# Patient Record
Sex: Male | Born: 1950 | Race: Black or African American | Hispanic: No | Marital: Married | Smoking: Current every day smoker
Health system: Southern US, Community
[De-identification: ages and names within clinical notes are randomized; demographics above are authoritative.]

## PROBLEM LIST (undated history)

## (undated) DIAGNOSIS — I10 Essential (primary) hypertension: Secondary | ICD-10-CM

---

## 1999-05-04 ENCOUNTER — Emergency Department (HOSPITAL_COMMUNITY): Admission: EM | Admit: 1999-05-04 | Discharge: 1999-05-04 | Payer: Self-pay | Admitting: Emergency Medicine

## 1999-12-21 ENCOUNTER — Emergency Department (HOSPITAL_COMMUNITY): Admission: EM | Admit: 1999-12-21 | Discharge: 1999-12-21 | Payer: Self-pay | Admitting: *Deleted

## 2001-02-06 ENCOUNTER — Emergency Department (HOSPITAL_COMMUNITY): Admission: EM | Admit: 2001-02-06 | Discharge: 2001-02-06 | Payer: Self-pay | Admitting: Emergency Medicine

## 2003-02-24 ENCOUNTER — Emergency Department (HOSPITAL_COMMUNITY): Admission: AD | Admit: 2003-02-24 | Discharge: 2003-02-24 | Payer: Self-pay | Admitting: Emergency Medicine

## 2009-08-15 ENCOUNTER — Emergency Department (HOSPITAL_COMMUNITY): Admission: EM | Admit: 2009-08-15 | Discharge: 2009-08-15 | Payer: Self-pay | Admitting: Emergency Medicine

## 2010-06-07 LAB — DIFFERENTIAL
Eosinophils Absolute: 0.3 10*3/uL (ref 0.0–0.7)
Eosinophils Relative: 4 % (ref 0–5)
Lymphocytes Relative: 17 % (ref 12–46)
Neutro Abs: 4.9 10*3/uL (ref 1.7–7.7)
Neutrophils Relative %: 69 % (ref 43–77)

## 2010-06-07 LAB — URINALYSIS, ROUTINE W REFLEX MICROSCOPIC
Glucose, UA: NEGATIVE mg/dL
Hgb urine dipstick: NEGATIVE
Ketones, ur: NEGATIVE mg/dL
Specific Gravity, Urine: 1.027 (ref 1.005–1.030)
Urobilinogen, UA: 0.2 mg/dL (ref 0.0–1.0)
pH: 6.5 (ref 5.0–8.0)

## 2010-06-07 LAB — POCT CARDIAC MARKERS: Troponin i, poc: <0.05 ng/mL (ref 0.00–0.09)

## 2010-06-07 LAB — CBC
HCT: 36.5 % — ABNORMAL LOW (ref 39.0–52.0)
MCHC: 35 g/dL (ref 30.0–36.0)
MCV: 92.9 fL (ref 78.0–100.0)
Platelets: 286 10*3/uL (ref 150–400)
RBC: 3.93 MIL/uL — ABNORMAL LOW (ref 4.22–5.81)
RDW: 13.9 % (ref 11.5–15.5)
WBC: 7.2 10*3/uL (ref 4.0–10.5)

## 2010-06-07 LAB — BASIC METABOLIC PANEL
BUN: 11 mg/dL (ref 6–23)
GFR calc Af Amer: >60 mL/min (ref >60)
GFR calc non Af Amer: >60 mL/min (ref >60)
Sodium: 132 mEq/L — ABNORMAL LOW (ref 135–145)

## 2010-06-07 LAB — URINE CULTURE: Colony Count: NO GROWTH

## 2010-06-07 LAB — POCT I-STAT, CHEM 8
Calcium, Ion: 1.15 mmol/L (ref 1.12–1.32)
Chloride: 105 mEq/L (ref 96–112)
HCT: 38 % — ABNORMAL LOW (ref 39.0–52.0)
Hemoglobin: 12.9 g/dL — ABNORMAL LOW (ref 13.0–17.0)
Potassium: 4 mEq/L (ref 3.5–5.1)

## 2010-07-22 ENCOUNTER — Emergency Department (HOSPITAL_COMMUNITY): Payer: Self-pay

## 2010-07-22 ENCOUNTER — Emergency Department (HOSPITAL_COMMUNITY)
Admission: EM | Admit: 2010-07-22 | Discharge: 2010-07-22 | Disposition: A | Discharge status: Home / Self Care | Payer: Self-pay | Attending: Emergency Medicine | Admitting: Emergency Medicine

## 2010-07-22 DIAGNOSIS — Z79899 Other long term (current) drug therapy: Secondary | ICD-10-CM | POA: Insufficient documentation

## 2010-07-22 DIAGNOSIS — M549 Dorsalgia, unspecified: Secondary | ICD-10-CM | POA: Insufficient documentation

## 2010-07-22 DIAGNOSIS — G8929 Other chronic pain: Secondary | ICD-10-CM | POA: Insufficient documentation

## 2010-07-22 DIAGNOSIS — R079 Chest pain, unspecified: Secondary | ICD-10-CM | POA: Insufficient documentation

## 2010-07-22 DIAGNOSIS — S20219A Contusion of unspecified front wall of thorax, initial encounter: Secondary | ICD-10-CM | POA: Insufficient documentation

## 2010-07-22 DIAGNOSIS — S298XXA Other specified injuries of thorax, initial encounter: Secondary | ICD-10-CM | POA: Insufficient documentation

## 2010-07-22 DIAGNOSIS — I10 Essential (primary) hypertension: Secondary | ICD-10-CM | POA: Insufficient documentation

## 2010-07-22 DIAGNOSIS — IMO0002 Reserved for concepts with insufficient information to code with codable children: Secondary | ICD-10-CM | POA: Insufficient documentation

## 2011-01-11 ENCOUNTER — Emergency Department (HOSPITAL_COMMUNITY)
Admission: EM | Admit: 2011-01-11 | Discharge: 2011-01-12 | Discharge status: Against Medical Advice | Payer: Self-pay | Attending: Emergency Medicine | Admitting: Emergency Medicine

## 2011-01-11 DIAGNOSIS — M7989 Other specified soft tissue disorders: Secondary | ICD-10-CM | POA: Insufficient documentation

## 2015-06-22 ENCOUNTER — Emergency Department (HOSPITAL_COMMUNITY)
Admission: EM | Admit: 2015-06-22 | Discharge: 2015-06-22 | Disposition: A | Discharge status: Home / Self Care | Payer: Non-veteran care | Attending: Emergency Medicine | Admitting: Emergency Medicine

## 2015-06-22 ENCOUNTER — Encounter (HOSPITAL_COMMUNITY): Payer: Self-pay | Admitting: Emergency Medicine

## 2015-06-22 ENCOUNTER — Emergency Department (HOSPITAL_COMMUNITY): Payer: Non-veteran care

## 2015-06-22 DIAGNOSIS — R200 Anesthesia of skin: Secondary | ICD-10-CM | POA: Insufficient documentation

## 2015-06-22 DIAGNOSIS — F17210 Nicotine dependence, cigarettes, uncomplicated: Secondary | ICD-10-CM | POA: Insufficient documentation

## 2015-06-22 DIAGNOSIS — I10 Essential (primary) hypertension: Secondary | ICD-10-CM | POA: Insufficient documentation

## 2015-06-22 DIAGNOSIS — M5412 Radiculopathy, cervical region: Secondary | ICD-10-CM | POA: Insufficient documentation

## 2015-06-22 HISTORY — DX: Essential (primary) hypertension: I10

## 2015-06-22 MED ORDER — METHYLPREDNISOLONE 4 MG PO TBPK: ORAL_TABLET | ORAL | Status: DC

## 2015-06-22 NOTE — ED Provider Notes (Signed)
CSN: [id number]     Arrival date & time 06/22/15  0436 History   First MD Initiated Contact with Patient 06/22/15 [phone]     Chief Complaint  Patient presents with  . Hand Problem     (Consider location/radiation/quality/duration/timing/severity/associated sxs/prior Treatment) HPI  This is a 65 year old male with a history of hypertension who presents with right arm pain and intermittent right hand numbness. Patient reports symptoms have been ongoing and waxing and waning for the last 2-3 months. He states that he has numbness over his second and third digits. Denies any numbness of the arm. Denies any weakness. States that sometimes the numbness is worse with range of motion of the right shoulder. He also reports occasional pain in the right shoulder. Symptoms worsened tonight and were more frequent. He denies any recent injury. He denies any neck pain. Denies any headache. Denies any other weakness, numbness, tingling, speech difficulty. He has not tried anything for his symptoms. He has not seen his doctor. Patient is currently asymptomatic.  Past Medical History  Diagnosis Date  . Hypertension    History reviewed. No pertinent past surgical history. No family history on file. Social History  Substance Use Topics  . Smoking status: Current Every Day Smoker -- 0.50 packs/day    Types: Cigarettes  . Smokeless tobacco: None  . Alcohol Use: None    Review of Systems  Constitutional: Negative for fever.  Musculoskeletal: Negative for neck pain and neck stiffness.       Right shoulder pain  Neurological: Positive for numbness. Negative for facial asymmetry, weakness and headaches.  All other systems reviewed and are negative.     Allergies  Review of patient's allergies indicates not on file.  Home Medications   Prior to Admission medications   Medication Sig Start Date End Date Taking? Authorizing Provider  methylPREDNISolone (MEDROL DOSEPAK) 4 MG TBPK tablet Take as directed  on packet 06/22/15   Merryl Hacker, MD   BP 146/71 mmHg  Pulse 60  Temp(Src) 97.8 F (36.6 C) (Oral)  Resp 18  Ht 5' 11.5" (1.816 m)  Wt 230 lb (104.327 kg)  BMI 31.63 kg/m2  SpO2 100% Physical Exam  Constitutional: He is oriented to person, place, and time. He appears well-developed and well-nourished. No distress.  HENT:  Head: Normocephalic and atraumatic.  Neck: Normal range of motion.  No midline C-spine tenderness  Cardiovascular: Normal rate, regular rhythm and normal heart sounds.   No murmur heard. Pulmonary/Chest: Effort normal and breath sounds normal. No respiratory distress. He has no wheezes.  Musculoskeletal: He exhibits no edema.  Normal range of motion of the right shoulder, no tenderness to palpation over the muscular structures, no obvious deformities, normal strength  Neurological: He is alert and oriented to person, place, and time.  5 out of 5 strength in all 4 extremities, sensation intact to light, cranial nerves II through XII intact  Skin: Skin is warm and dry.  Psychiatric: He has a normal mood and affect.  Nursing note and vitals reviewed.   ED Course  Procedures (including critical care time) Labs Review Labs Reviewed - No data to display  Imaging Review Dg Shoulder Right  06/22/2015  CLINICAL DATA:  Chronic right shoulder pain for 3 months. Initial encounter. EXAM: RIGHT SHOULDER - 2+ VIEW COMPARISON:  None. FINDINGS: There is no evidence of fracture or dislocation. The right humeral head is seated within the glenoid fossa. Degenerative change is noted at the right acromioclavicular joint. No significant  soft tissue abnormalities are seen. The visualized portions of the lungs are clear. IMPRESSION: 1. No evidence of fracture or dislocation. 2. Degenerative change at the right acromioclavicular joint. Electronically Signed   By: Garald Balding M.D.   On: 06/22/2015 05:26   I have personally reviewed and evaluated these images and lab results as part  of my medical decision-making.   EKG Interpretation None      MDM   Final diagnoses:  C7 radiculopathy    Patient presents with pain in the right shoulder and waxing and waning numbness of the second digit of the right hand. He is nontoxic on exam. Symptoms have been waxing and waning for the last 2-3 months. Numbness seems to be exacerbated with range of motion of the shoulder. It is in the distribution of C7. He's currently intact. Otherwise he has no other neurologic deficit. Doubt stroke. Plain films of the right shoulder are negative. He has no neck pain. Discussed with the patient my thoughts that this was likely a radicular symptoms secondary to nerve impingement at the shoulder. Will trial patient on a Medrol dose pack. Follow-up with primary physician if symptoms persist for possible MRI of the right shoulder or neck. No indication for emergent imaging at this time.  After history, exam, and medical workup I feel the patient has been appropriately medically screened and is safe for discharge home. Pertinent diagnoses were discussed with the patient. Patient was given return precautions.     Merryl Hacker, MD 06/22/15 [phone]

## 2015-06-22 NOTE — ED Notes (Signed)
MD at bedside. 

## 2015-06-22 NOTE — ED Notes (Signed)
Pt reports that he woke up this AM and found his hand numb.  He has had the feeling return but then "it gets numb again."  Apparently this has been going on for the past month.

## 2015-06-22 NOTE — ED Notes (Signed)
Pt reports that he is worried b/c he "might have swallowed some mouthwash" this morning.

## 2015-06-22 NOTE — Discharge Instructions (Signed)

## 2015-06-22 NOTE — ED Notes (Signed)
Patient transported to X-ray 

## 2015-09-25 ENCOUNTER — Emergency Department (HOSPITAL_COMMUNITY)
Admission: EM | Admit: 2015-09-25 | Discharge: 2015-09-25 | Disposition: A | Discharge status: Home / Self Care | Payer: Non-veteran care | Attending: Emergency Medicine | Admitting: Emergency Medicine

## 2015-09-25 ENCOUNTER — Emergency Department (HOSPITAL_COMMUNITY): Payer: Non-veteran care

## 2015-09-25 ENCOUNTER — Encounter (HOSPITAL_COMMUNITY): Payer: Self-pay | Admitting: Emergency Medicine

## 2015-09-25 DIAGNOSIS — M10021 Idiopathic gout, right elbow: Secondary | ICD-10-CM | POA: Insufficient documentation

## 2015-09-25 DIAGNOSIS — I10 Essential (primary) hypertension: Secondary | ICD-10-CM | POA: Insufficient documentation

## 2015-09-25 DIAGNOSIS — M109 Gout, unspecified: Secondary | ICD-10-CM

## 2015-09-25 DIAGNOSIS — F17210 Nicotine dependence, cigarettes, uncomplicated: Secondary | ICD-10-CM | POA: Insufficient documentation

## 2015-09-25 LAB — CBC WITH DIFFERENTIAL/PLATELET
Basophils Absolute: 0.1 10*3/uL (ref 0.0–0.1)
Basophils Relative: 1 %
Eosinophils Absolute: 0.3 10*3/uL (ref 0.0–0.7)
Eosinophils Relative: 3 %
HEMATOCRIT: 36.6 % — AB (ref 39.0–52.0)
HEMOGLOBIN: 12.5 g/dL — AB (ref 13.0–17.0)
LYMPHS ABS: 1.2 10*3/uL (ref 0.7–4.0)
LYMPHS PCT: 15 %
MCH: 30.9 pg (ref 26.0–34.0)
MCHC: 34.2 g/dL (ref 30.0–36.0)
MCV: 90.4 fL (ref 78.0–100.0)
MONOS PCT: 15 %
Monocytes Absolute: 1.2 10*3/uL — ABNORMAL HIGH (ref 0.1–1.0)
NEUTROS ABS: 5.5 10*3/uL (ref 1.7–7.7)
NEUTROS PCT: 66 %
PLATELETS: 260 10*3/uL (ref 150–400)
RBC: 4.05 MIL/uL — AB (ref 4.22–5.81)
RDW: 13.6 % (ref 11.5–15.5)
WBC: 8.2 10*3/uL (ref 4.0–10.5)

## 2015-09-25 LAB — BASIC METABOLIC PANEL
ANION GAP: 8 (ref 5–15)
BUN: 7 mg/dL (ref 6–20)
CO2: 26 mmol/L (ref 22–32)
Calcium: 9.2 mg/dL (ref 8.9–10.3)
Chloride: 103 mmol/L (ref 101–111)
Creatinine, Ser: 1.01 mg/dL (ref 0.61–1.24)
GFR calc Af Amer: >60 mL/min (ref >60)
GLUCOSE: 120 mg/dL — AB (ref 65–99)
POTASSIUM: 3.5 mmol/L (ref 3.5–5.1)
Sodium: 137 mmol/L (ref 135–145)

## 2015-09-25 LAB — SEDIMENTATION RATE: Sed Rate: 64 mm/hr — ABNORMAL HIGH (ref 0–16)

## 2015-09-25 LAB — URIC ACID: URIC ACID, SERUM: 8 mg/dL — AB (ref 4.4–7.6)

## 2015-09-25 LAB — C-REACTIVE PROTEIN: CRP: 8.1 mg/dL — ABNORMAL HIGH (ref <1.0)

## 2015-09-25 MED ORDER — ONDANSETRON HCL 4 MG/2ML IJ SOLN
4.0000 mg | Freq: Once | INTRAMUSCULAR | Status: AC
  Administered 2015-09-25: 4 mg via INTRAVENOUS
  Filled 2015-09-25: qty 2

## 2015-09-25 MED ORDER — TETANUS-DIPHTH-ACELL PERTUSSIS 5-2.5-18.5 LF-MCG/0.5 IM SUSP
0.5000 mL | Freq: Once | INTRAMUSCULAR | Status: AC
  Administered 2015-09-25: 0.5 mL via INTRAMUSCULAR
  Filled 2015-09-25: qty 0.5

## 2015-09-25 MED ORDER — COLCHICINE 0.6 MG PO TABS: ORAL_TABLET | ORAL | Status: DC

## 2015-09-25 MED ORDER — CLINDAMYCIN HCL 150 MG PO CAPS
450.0000 mg | ORAL_CAPSULE | Freq: Once | ORAL | Status: AC
  Administered 2015-09-25: 450 mg via ORAL
  Filled 2015-09-25: qty 3

## 2015-09-25 MED ORDER — OXYCODONE-ACETAMINOPHEN 5-325 MG PO TABS: 1.0000 | ORAL_TABLET | Freq: Four times a day (QID) | ORAL | Status: DC | PRN

## 2015-09-25 MED ORDER — MORPHINE SULFATE (PF) 4 MG/ML IV SOLN
4.0000 mg | Freq: Once | INTRAVENOUS | Status: AC
  Administered 2015-09-25: 4 mg via INTRAVENOUS
  Filled 2015-09-25: qty 1

## 2015-09-25 MED ORDER — SODIUM CHLORIDE 0.9 % IV BOLUS (SEPSIS)
1000.0000 mL | Freq: Once | INTRAVENOUS | Status: AC
  Administered 2015-09-25: 1000 mL via INTRAVENOUS

## 2015-09-25 NOTE — ED Provider Notes (Signed)
CSN: [id number]     Arrival date & time 09/25/15  0605 History   First MD Initiated Contact with Patient 09/25/15 [phone]     Chief Complaint  Patient presents with  . Elbow Pain  . Insect Bite     (Consider location/radiation/quality/duration/timing/severity/associated sxs/prior Treatment) HPI   Pulse 70, temperature 97.9 F (36.6 C), temperature source Oral, resp. rate 16, height 5\' 10"  (1.778 m), weight 102.059 kg, SpO2 98 %.  Eddie Mendoza is a 65 y.o. male complaining of pain and swelling worsening over the course of 2 days to right elbow. Pain is severe, 10 out of 10 and exacerbated by movement and palpation. Does not remember trauma to the area however he's been working on an old house in him and his wife both received several bug bites but he has not received any bug bite to the elbow that he remembers. Patient denies fever, chills, nausea, vomiting. Patient is right-hand-dominant.  Past Medical History  Diagnosis Date  . Hypertension    History reviewed. No pertinent past surgical history. No family history on file. Social History  Substance Use Topics  . Smoking status: Current Every Day Smoker -- 0.50 packs/day for 30 years    Types: Cigarettes  . Smokeless tobacco: Never Used  . Alcohol Use: 1.8 oz/week    3 Shots of liquor per week    Review of Systems  10 systems reviewed and found to be negative, except as noted in the HPI.   Allergies  Review of patient's allergies indicates no known allergies.  Home Medications   Prior to Admission medications   Medication Sig Start Date End Date Taking? Authorizing Provider  methylPREDNISolone (MEDROL DOSEPAK) 4 MG TBPK tablet Take as directed on packet 06/22/15   Merryl Hacker, MD   Pulse 70  Temp(Src) 97.9 F (36.6 C) (Oral)  Resp 16  Ht 5\' 10"  (1.778 m)  Wt 102.059 kg  BMI 32.28 kg/m2  SpO2 98% Physical Exam  Constitutional: He is oriented to person, place, and time. He appears well-developed and  well-nourished. No distress.  HENT:  Head: Normocephalic.  Eyes: Conjunctivae and EOM are normal.  Cardiovascular: Normal rate.   Pulmonary/Chest: Effort normal and breath sounds normal. No stridor.  Musculoskeletal: Normal range of motion. He exhibits edema.  No overlying puncture wounds to right elbow, moderate warmth, mild induration to skin without fluctuance or palpable bursa, severe pain with passive extension and flexion of right elbow, distally neurovascularly intact with 2+ radial pulse and patient able to differentiate between pinprick and light touch on the fingers.  Neurological: He is alert and oriented to person, place, and time.  Psychiatric: He has a normal mood and affect.  Nursing note and vitals reviewed.   ED Course  Procedures (including critical care time) Labs Review Labs Reviewed - No data to display  Imaging Review No results found. I have personally reviewed and evaluated these images and lab results as part of my medical decision-making.   EKG Interpretation None      MDM   Final diagnoses:  Acute gout of right elbow, unspecified cause   Filed Vitals:   09/25/15 0619 09/25/15 0630 09/25/15 0700  BP:  148/80 154/68  Pulse: 70 64 62  Temp: 97.9 F (36.6 C)    TempSrc: Oral    Resp: 16    Height: 5\' 10"  (1.778 m)    Weight: 102.059 kg    SpO2: 98% 100% 98%    Medications  Tdap (BOOSTRIX)  injection 0.5 mL (not administered)  clindamycin (CLEOCIN) capsule 450 mg (not administered)  morphine 4 MG/ML injection 4 mg (4 mg Intravenous Given 09/25/15 0820)  ondansetron (ZOFRAN) injection 4 mg (4 mg Intravenous Given 09/25/15 0816)    Eddie Mendoza is 65 y.o. male presenting with Significant pain and swelling to right elbow onset several days ago, patient states that he has been trying to renovate an older house, him and his wife have been bitten by unknown insects. Tetanus shot is updated, patient has severe pain with movement of the elbow, there is  very mild overlying cellulitis of the elbow, no systemic signs of infection. Given the severity of his pain with movement will check basic blood work, get x-ray and IV pain medication.  Patients CRP is aches, normal under 1, sedimentation rate is 60, normal under 20. Patient does have improved range of motion with IV morphine however, concerned about septic joint versus a overlying cellulitis. Attending physician has evaluated this patient and also agrees that exam is concerning for possible septic joint, given the differential of cellulitis we are hesitant to tap the elbow, will discuss with hand surgeon Dr. Fredna Dow.  Fredna Dow will come to evaluate the patient, request that we get a uric acid. Patient states he has a family history of no personal history.  Uric acid elevated, discussed with Dr. Fredna Dow who recommends colchicine, will also add on Percocet. Patient given referral for local care.  Evaluation does not show pathology that would require ongoing emergent intervention or inpatient treatment. Pt is hemodynamically stable and mentating appropriately. Discussed findings and plan with patient/guardian, who agrees with care plan. All questions answered. Return precautions discussed and outpatient follow up given.   Discharge Medication List as of 09/25/2015 12:10 PM    START taking these medications   Details  colchicine 0.6 MG tablet Take 0.6mg  (one tablet) by mouth every 1-2 hours until one of the following occurs: 1.  The pain is gone 2.  The maximum dose has been given ( no more than 3 tabs in 3 hours or 10 tabs in 24 hours) 3.  The side effects outweight the benefits, Print    oxyCODONE-acetaminophen (PERCOCET) 5-325 MG tablet Take 1 tablet by mouth every 6 (six) hours as needed., Starting 09/25/2015, Until Discontinued, [employer], PA-C 09/25/15 1330  Veryl Speak, MD 09/25/15 1539

## 2015-09-25 NOTE — Discharge Instructions (Signed)
Do not hesitate to return to the emergency room for any new, worsening or concerning symptoms.  Please obtain primary care using resource guide below. Let them know that you were seen in the emergency room and that they will need to obtain records for further outpatient management.  Take percocet for breakthrough pain, do not drink alcohol, drive, care for children or do other critical tasks while taking percocet.  . Gout Gout is an inflammatory arthritis caused by a buildup of uric acid crystals in the joints. Uric acid is a chemical that is normally present in the blood. When the level of uric acid in the blood is too high it can form crystals that deposit in your joints and tissues. This causes joint redness, soreness, and swelling (inflammation). Repeat attacks are common. Over time, uric acid crystals can form into masses (tophi) near a joint, destroying bone and causing disfigurement. Gout is treatable and often preventable. CAUSES  The disease begins with elevated levels of uric acid in the blood. Uric acid is produced by your body when it breaks down a naturally found substance called purines. Certain foods you eat, such as meats and fish, contain high amounts of purines. Causes of an elevated uric acid level include:  Being passed down from parent to child (heredity).  Diseases that cause increased uric acid production (such as obesity, psoriasis, and certain cancers).  Excessive alcohol use.  Diet, especially diets rich in meat and seafood.  Medicines, including certain cancer-fighting medicines (chemotherapy), water pills (diuretics), and aspirin.  Chronic kidney disease. The kidneys are no longer able to remove uric acid well.  Problems with metabolism. Conditions strongly associated with gout include:  Obesity.  High blood pressure.  High cholesterol.  Diabetes. Not everyone with elevated uric acid levels gets gout. It is not understood why some people get gout and  others do not. Surgery, joint injury, and eating too much of certain foods are some of the factors that can lead to gout attacks. SYMPTOMS   An attack of gout comes on quickly. It causes intense pain with redness, swelling, and warmth in a joint.  Fever can occur.  Often, only one joint is involved. Certain joints are more commonly involved:  Base of the big toe.  Knee.  Ankle.  Wrist.  Finger. Without treatment, an attack usually goes away in a few days to weeks. Between attacks, you usually will not have symptoms, which is different from many other forms of arthritis. DIAGNOSIS  Your caregiver will suspect gout based on your symptoms and exam. In some cases, tests may be recommended. The tests may include:  Blood tests.  Urine tests.  X-rays.  Joint fluid exam. This exam requires a needle to remove fluid from the joint (arthrocentesis). Using a microscope, gout is confirmed when uric acid crystals are seen in the joint fluid. TREATMENT  There are two phases to gout treatment: treating the sudden onset (acute) attack and preventing attacks (prophylaxis).  Treatment of an Acute Attack.  Medicines are used. These include anti-inflammatory medicines or steroid medicines.  An injection of steroid medicine into the affected joint is sometimes necessary.  The painful joint is rested. Movement can worsen the arthritis.  You may use warm or cold treatments on painful joints, depending which works best for you.  Treatment to Prevent Attacks.  If you suffer from frequent gout attacks, your caregiver may advise preventive medicine. These medicines are started after the acute attack subsides. These medicines either help your kidneys  eliminate uric acid from your body or decrease your uric acid production. You may need to stay on these medicines for a very long time.  The early phase of treatment with preventive medicine can be associated with an increase in acute gout attacks. For  this reason, during the first few months of treatment, your caregiver may also advise you to take medicines usually used for acute gout treatment. Be sure you understand your caregiver's directions. Your caregiver may make several adjustments to your medicine dose before these medicines are effective.  Discuss dietary treatment with your caregiver or dietitian. Alcohol and drinks high in sugar and fructose and foods such as meat, poultry, and seafood can increase uric acid levels. Your caregiver or dietitian can advise you on drinks and foods that should be limited. HOME CARE INSTRUCTIONS   Do not take aspirin to relieve pain. This raises uric acid levels.  Only take over-the-counter or prescription medicines for pain, discomfort, or fever as directed by your caregiver.  Rest the joint as much as possible. When in bed, keep sheets and blankets off painful areas.  Keep the affected joint raised (elevated).  Apply warm or cold treatments to painful joints. Use of warm or cold treatments depends on which works best for you.  Use crutches if the painful joint is in your leg.  Drink enough fluids to keep your urine clear or pale yellow. This helps your body get rid of uric acid. Limit alcohol, sugary drinks, and fructose drinks.  Follow your dietary instructions. Pay careful attention to the amount of protein you eat. Your daily diet should emphasize fruits, vegetables, whole grains, and fat-free or low-fat milk products. Discuss the use of coffee, vitamin C, and cherries with your caregiver or dietitian. These may be helpful in lowering uric acid levels.  Maintain a healthy body weight. SEEK MEDICAL CARE IF:   You develop diarrhea, vomiting, or any side effects from medicines.  You do not feel better in 24 hours, or you are getting worse. SEEK IMMEDIATE MEDICAL CARE IF:   Your joint becomes suddenly more tender, and you have chills or a fever. MAKE SURE YOU:   Understand these  instructions.  Will watch your condition.  Will get help right away if you are not doing well or get worse.   This information is not intended to replace advice given to you by your health care provider. Make sure you discuss any questions you have with your health care provider.   Document Released: 03/04/2000 Document Revised: 03/28/2014 Document Reviewed: 10/19/2011 Elsevier Interactive Patient Education 2016 [place] are compounds that affect the level of uric acid in your body. A low-purine diet is a diet that is low in purines. Eating a low-purine diet can prevent the level of uric acid in your body from getting too high and causing gout or kidney stones or both. WHAT DO I NEED TO KNOW ABOUT THIS DIET?  Choose low-purine foods. Examples of low-purine foods are listed in the next section.  Drink plenty of fluids, especially water. Fluids can help remove uric acid from your body. Try to drink 8-16 cups (1.9-3.8 L) a day.  Limit foods high in fat, especially saturated fat, as fat makes it harder for the body to get rid of uric acid. Foods high in saturated fat include pizza, cheese, ice cream, whole milk, fried foods, and gravies. Choose foods that are lower in fat and lean sources of protein. Use olive oil when  cooking as it contains healthy fats that are not high in saturated fat.  Limit alcohol. Alcohol interferes with the elimination of uric acid from your body. If you are having a gout attack, avoid all alcohol.  Keep in mind that different people's bodies react differently to different foods. You will probably learn over time which foods do or do not affect you. If you discover that a food tends to cause your gout to flare up, avoid eating that food. You can more freely enjoy foods that do not cause problems. If you have any questions about a food item, talk to your dietitian or health care provider. WHICH FOODS ARE LOW, MODERATE, AND HIGH IN  PURINES? The following is a list of foods that are low, moderate, and high in purines. You can eat any amount of the foods that are low in purines. You may be able to have small amounts of foods that are moderate in purines. Ask your health care provider how much of a food moderate in purines you can have. Avoid foods high in purines. Grains  Foods low in purines: Enriched white bread, pasta, rice, cake, cornbread, popcorn.  Foods moderate in purines: Whole-grain breads and cereals, wheat germ, bran, oatmeal. Uncooked oatmeal. Dry wheat bran or wheat germ.  Foods high in purines: Pancakes, [place] toast, biscuits, muffins. Vegetables  Foods low in purines: All vegetables, except those that are moderate in purines.  Foods moderate in purines: Asparagus, cauliflower, spinach, mushrooms, green peas. Fruits  All fruits are low in purines. Meats and other Protein Foods  Foods low in purines: Eggs, nuts, peanut butter.  Foods moderate in purines: 80-90% lean beef, lamb, veal, pork, poultry, fish, eggs, peanut butter, nuts. Crab, lobster, oysters, and shrimp. Cooked dried beans, peas, and lentils.  Foods high in purines: Anchovies, sardines, herring, mussels, tuna, codfish, scallops, trout, and haddock. Berniece Salines. Organ meats (such as liver or kidney). [place]. Game meat. Goose. Sweetbreads. Dairy  All dairy foods are low in purines. Low-fat and fat-free dairy products are best because they are low in saturated fat. Beverages  Drinks low in purines: Water, carbonated beverages, tea, coffee, cocoa.  Drinks moderate in purines: Soft drinks and other drinks sweetened with high-fructose corn syrup. Juices. To find whether a food or drink is sweetened with high-fructose corn syrup, look at the ingredients list.  Drinks high in purines: Alcoholic beverages (such as beer). Condiments  Foods low in purines: Salt, herbs, olives, pickles, relishes, vinegar.  Foods moderate in purines: Butter,  margarine, oils, mayonnaise. Fats and Oils  Foods low in purines: All types, except gravies and sauces made with meat.  Foods high in purines: Gravies and sauces made with meat. Other Foods  Foods low in purines: Sugars, sweets, gelatin. Cake. Soups made without meat.  Foods moderate in purines: Meat-based or fish-based soups, broths, or bouillons. Foods and drinks sweetened with high-fructose corn syrup.  Foods high in purines: High-fat desserts (such as ice cream, cookies, cakes, pies, doughnuts, and chocolate). Contact your dietitian for more information on foods that are not listed here.   This information is not intended to replace advice given to you by your health care provider. Make sure you discuss any questions you have with your health care provider.   Document Released: 07/02/2010 Document Revised: 03/12/2013 Document Reviewed: 02/11/2013 Elsevier Interactive Patient Education 2016 [place] [employer] is a great source of information about community services available.  Access by  dialing 2-1-1 from anywhere in [place], or by website -  CustodianSupply.fi.   Other Local Resources (Updated 03/2015)  [place]    Phone Number and Address  [facility] medical care - 1st and 3rd Saturday of every month  Must not qualify for public or private insurance and must have limited income [phone] [address], [place]  Child care  Emergency assistance for housing and [employer]  Medicaid [phone] [address], [place] [address]   [facility] Department  Low-cost medical care for children, communicable diseases, sexually-transmitted diseases, immunizations, maternity care, womens health and family planning [phone] [address], [place] [address]  [facility]   Medication assistance for [facility] residents  Must meet income requirements [phone] [place], [place].    [place]  Child care  Emergency assistance for housing and [employer]  Medicaid [phone] [address] [place], [place] [address]  [facility] [place]   Low-cost medical care,   Monday through Friday, 9 am to 6 pm.   Accepts Medicare/Medicaid, and self-pay [phone] [address] [place], [place] [address]  [facility] for [place] care - Monday through Friday, 8:30 am - 5:30 pm  Accepts Medicaid and self-pay [phone] [address], [place], [place] [address]   [facility]  Primary medical care, including for those with sickle cell disease  Accepts Medicare, Medicaid, insurance and self-pay [phone] [place], [place]  [facility]   Primary medical care  Accepts Medicare, [place], insurance and self-pay [phone] [place], [place] [address]   [facility] Department of Social Services  Child care  Emergency assistance for housing and [employer]  Medicaid [phone] [address] [place], [place] [address]  [place] Department of Health and [employer]  Child care  Emergency assistance for housing and [employer]  Medicaid [phone] [place], [place] [address]   [facility] Medication Assistance [facility]  Medication assistance for [facility] residents with no insurance only  Must have a primary care doctor [phone] [address], [place], [place]  [facility]   Primary medical care  [place], [place], insurance  [phone] [address] [place], [place]  [facility]   Medication assistance [phone]  [facility]   Primary medical care  Accepts Medicare, [place], insurance and self-pay [phone] [address], [place] [address]   Internal Medicine   Primary medical care  Accepts Medicare, [place], insurance and self-pay [phone] [address], [place] [address]  [facility]  For [place] residents between the ages of 91 and 47 who do not have any form of health insurance, Medicare, [place], or [place] benefits.  Services are provided free of charge to uninsured patients who fall within federal poverty guidelines.    Hours: Tuesdays and Thursdays, 4:15 - 8 pm [phone] [address] [place], [place] [address]  [facility]     Primary medical care  Dental care  Nutritional counseling  Pharmacy  Accepts Medicaid, Medicare, most insurance.  Fees are adjusted based on ability to pay.  [place], [place] [address], [place], [place] [facility], [place], [facility] [place], [place]  [facility]  Womens health and family planning [phone] [place]. [place], [place] care  Emergency assistance for housing and [employer]  Medicaid [phone] [address], [place], [place] [address]   [facility]    Ages 82 and older  Hours: Mondays and Thursdays, 7:00 am - 9:00 am Patients are seen on a first come, first served basis. [phone], ext. [place], [place]  Child care  Emergency assistance for housing and [employer]  Medicaid [phone] [address], [place] [address]  [facility] [place]  Medication assistance  Rental assistance  Food pantry  Medication assistance  Housing assistance  Emergency food distribution  Utility assistance [place], [place]  [place]. [place], [place] [address] Hours: Tuesdays and Thursdays from 9am - 12 noon by appointment only  [address], [place] [address]  [facility] [place] private insurance, [place], and [place].  Payment is based on a sliding scale for those without insurance.  Hours: Mondays, Tuesdays and Thursdays, 8:30 am - 5:30 pm.   [phone] [place], [place]  [facility] - Family Medicine at [facility], [place], and [place].  Payment is based on a sliding scale for those without insurance. [phone] [address] [place], [place]  [facility] - Pediatrics at [address] Scientist, research (medical), [employer], and [place].  Payment is based on a sliding scale for those without insurance [phone] [address] [place], [employer], [place]  [facility] - Pediatrics at [employer], [place], and [place].  Payment is based on a sliding scale for those without insurance. [phone] [place], [place]  [facility] - Pediatrics at [facility], [place], and [place].  Payment is based on a sliding scale for those without insurance. [phone], ext. [phone] [place], [place].    [place] care.  Accepts Medicaid and self-pay.  [place], [place]

## 2015-09-25 NOTE — Progress Notes (Signed)
Orthopedic Tech Progress Note Patient Details:  Eddie Mendoza January 02, 1951 [id number]  Ortho Devices Type of Ortho Device: Arm sling   [name] 09/25/2015, 12:35 PM

## 2015-09-25 NOTE — ED Notes (Signed)
Ice pack applied to rt. elbow

## 2015-09-25 NOTE — ED Notes (Signed)
Pt coming from home with R arm in homemade sling. States he doesn't remember hitting on anything, but he and his wife have been "working a lot, cleaning up an old house." Obvious swelling to R elbow with pain to movement & palpation. +PMS in hand & fingers. Also notes several bite marks to bilat LEs that he reports "itch like fire". Unknown exposure to any particular insects but states insects "of all kinds" in old house they've been working in. Denies fever, chills, or flu-like symptoms.

## 2020-06-20 ENCOUNTER — Inpatient Hospital Stay (HOSPITAL_COMMUNITY)
Admission: EM | Admit: 2020-06-20 | Discharge: 2020-06-22 | DRG: 696 | Disposition: A | Discharge status: Home / Self Care | Payer: No Typology Code available for payment source | Attending: Internal Medicine | Admitting: Internal Medicine

## 2020-06-20 ENCOUNTER — Emergency Department (HOSPITAL_COMMUNITY): Payer: No Typology Code available for payment source

## 2020-06-20 ENCOUNTER — Other Ambulatory Visit: Payer: Self-pay

## 2020-06-20 ENCOUNTER — Encounter (HOSPITAL_COMMUNITY): Payer: Self-pay

## 2020-06-20 DIAGNOSIS — R710 Precipitous drop in hematocrit: Secondary | ICD-10-CM | POA: Diagnosis present

## 2020-06-20 DIAGNOSIS — Z28310 Unvaccinated for covid-19: Secondary | ICD-10-CM | POA: Diagnosis not present

## 2020-06-20 DIAGNOSIS — N401 Enlarged prostate with lower urinary tract symptoms: Secondary | ICD-10-CM | POA: Diagnosis present

## 2020-06-20 DIAGNOSIS — R1030 Lower abdominal pain, unspecified: Secondary | ICD-10-CM | POA: Diagnosis present

## 2020-06-20 DIAGNOSIS — Z8249 Family history of ischemic heart disease and other diseases of the circulatory system: Secondary | ICD-10-CM | POA: Diagnosis not present

## 2020-06-20 DIAGNOSIS — R310 Gross hematuria: Principal | ICD-10-CM | POA: Diagnosis present

## 2020-06-20 DIAGNOSIS — Z23 Encounter for immunization: Secondary | ICD-10-CM

## 2020-06-20 DIAGNOSIS — R030 Elevated blood-pressure reading, without diagnosis of hypertension: Secondary | ICD-10-CM

## 2020-06-20 DIAGNOSIS — Z720 Tobacco use: Secondary | ICD-10-CM | POA: Diagnosis not present

## 2020-06-20 DIAGNOSIS — Z20822 Contact with and (suspected) exposure to covid-19: Secondary | ICD-10-CM | POA: Diagnosis present

## 2020-06-20 DIAGNOSIS — F17210 Nicotine dependence, cigarettes, uncomplicated: Secondary | ICD-10-CM | POA: Diagnosis present

## 2020-06-20 DIAGNOSIS — I10 Essential (primary) hypertension: Secondary | ICD-10-CM | POA: Diagnosis present

## 2020-06-20 DIAGNOSIS — R319 Hematuria, unspecified: Secondary | ICD-10-CM | POA: Diagnosis present

## 2020-06-20 LAB — URINALYSIS, ROUTINE W REFLEX MICROSCOPIC
Bacteria, UA: NONE SEEN
Bilirubin Urine: NEGATIVE
Glucose, UA: NEGATIVE mg/dL
Ketones, ur: NEGATIVE mg/dL
Nitrite: NEGATIVE
Protein, ur: NEGATIVE mg/dL
RBC / HPF: >50 RBC/hpf — ABNORMAL HIGH (ref 0–5)
Specific Gravity, Urine: 1.006 (ref 1.005–1.030)
WBC, UA: >50 WBC/hpf — ABNORMAL HIGH (ref 0–5)
pH: 9 — ABNORMAL HIGH (ref 5.0–8.0)

## 2020-06-20 LAB — CBC WITH DIFFERENTIAL/PLATELET
Abs Immature Granulocytes: 0.08 10*3/uL — ABNORMAL HIGH (ref 0.00–0.07)
Basophils Absolute: 0.1 10*3/uL (ref 0.0–0.1)
Basophils Relative: 0 %
Eosinophils Absolute: 0 10*3/uL (ref 0.0–0.5)
Eosinophils Relative: 0 %
HCT: 34.3 % — ABNORMAL LOW (ref 39.0–52.0)
Hemoglobin: 11.7 g/dL — ABNORMAL LOW (ref 13.0–17.0)
Immature Granulocytes: 1 %
Lymphocytes Relative: 11 %
Lymphs Abs: 1.4 10*3/uL (ref 0.7–4.0)
MCH: 32.5 pg (ref 26.0–34.0)
MCHC: 34.1 g/dL (ref 30.0–36.0)
MCV: 95.3 fL (ref 80.0–100.0)
Monocytes Absolute: 1 10*3/uL (ref 0.1–1.0)
Monocytes Relative: 8 %
Neutro Abs: 10.2 10*3/uL — ABNORMAL HIGH (ref 1.7–7.7)
Neutrophils Relative %: 80 %
Platelets: 340 10*3/uL (ref 150–400)
RBC: 3.6 MIL/uL — ABNORMAL LOW (ref 4.22–5.81)
RDW: 13.7 % (ref 11.5–15.5)
WBC: 12.6 10*3/uL — ABNORMAL HIGH (ref 4.0–10.5)
nRBC: 0 % (ref 0.0–0.2)

## 2020-06-20 LAB — BASIC METABOLIC PANEL
Anion gap: 13 (ref 5–15)
BUN: 15 mg/dL (ref 8–23)
CO2: 20 mmol/L — ABNORMAL LOW (ref 22–32)
Calcium: 8.8 mg/dL — ABNORMAL LOW (ref 8.9–10.3)
Chloride: 106 mmol/L (ref 98–111)
Creatinine, Ser: 1.02 mg/dL (ref 0.61–1.24)
GFR, Estimated: >60 mL/min (ref >60)
Glucose, Bld: 167 mg/dL — ABNORMAL HIGH (ref 70–99)
Potassium: 3.6 mmol/L (ref 3.5–5.1)
Sodium: 139 mmol/L (ref 135–145)

## 2020-06-20 LAB — PROTIME-INR
INR: 1 (ref 0.8–1.2)
Prothrombin Time: 12.8 seconds (ref 11.4–15.2)

## 2020-06-20 LAB — HEMOGLOBIN AND HEMATOCRIT, BLOOD
HCT: 32.7 % — ABNORMAL LOW (ref 39.0–52.0)
Hemoglobin: 11.1 g/dL — ABNORMAL LOW (ref 13.0–17.0)

## 2020-06-20 MED ORDER — SODIUM CHLORIDE 0.9 % IV SOLN
1.0000 g | Freq: Once | INTRAVENOUS | Status: AC
  Administered 2020-06-20: 1 g via INTRAVENOUS
  Filled 2020-06-20: qty 10

## 2020-06-20 MED ORDER — SODIUM CHLORIDE 0.9 % IR SOLN
3000.0000 mL | Status: DC
  Administered 2020-06-20 – 2020-06-21 (×3): 3000 mL

## 2020-06-20 MED ORDER — ONDANSETRON HCL 4 MG/2ML IJ SOLN: 4.0000 mg | Freq: Four times a day (QID) | INTRAMUSCULAR | Status: DC | PRN

## 2020-06-20 MED ORDER — LIDOCAINE HCL URETHRAL/MUCOSAL 2 % EX GEL
1.0000 "application " | Freq: Once | CUTANEOUS | Status: AC
  Administered 2020-06-20: 1 via URETHRAL
  Filled 2020-06-20: qty 10

## 2020-06-20 MED ORDER — NICOTINE 7 MG/24HR TD PT24
7.0000 mg | MEDICATED_PATCH | Freq: Every day | TRANSDERMAL | Status: DC
  Filled 2020-06-20: qty 1

## 2020-06-20 MED ORDER — HYDROMORPHONE HCL 1 MG/ML IJ SOLN
1.0000 mg | Freq: Once | INTRAMUSCULAR | Status: AC
  Administered 2020-06-20: 1 mg via INTRAVENOUS
  Filled 2020-06-20: qty 1

## 2020-06-20 MED ORDER — LABETALOL HCL 5 MG/ML IV SOLN: 10.0000 mg | INTRAVENOUS | Status: DC | PRN

## 2020-06-20 MED ORDER — POLYETHYLENE GLYCOL 3350 17 G PO PACK: 17.0000 g | PACK | Freq: Every day | ORAL | Status: DC | PRN

## 2020-06-20 MED ORDER — ONDANSETRON HCL 4 MG PO TABS
4.0000 mg | ORAL_TABLET | Freq: Four times a day (QID) | ORAL | Status: DC | PRN
Start: 2020-06-20 — End: 2020-06-22

## 2020-06-20 MED ORDER — ACETAMINOPHEN 650 MG RE SUPP: 650.0000 mg | Freq: Four times a day (QID) | RECTAL | Status: DC | PRN

## 2020-06-20 MED ORDER — ACETAMINOPHEN 325 MG PO TABS: 650.0000 mg | ORAL_TABLET | Freq: Four times a day (QID) | ORAL | Status: DC | PRN

## 2020-06-20 MED ORDER — MORPHINE SULFATE (PF) 4 MG/ML IV SOLN
4.0000 mg | INTRAVENOUS | Status: DC | PRN
  Administered 2020-06-21 – 2020-06-22 (×3): 4 mg via INTRAVENOUS
  Filled 2020-06-20 (×3): qty 1

## 2020-06-20 MED ORDER — CHLORHEXIDINE GLUCONATE CLOTH 2 % EX PADS
6.0000 | MEDICATED_PAD | Freq: Every day | CUTANEOUS | Status: DC
  Administered 2020-06-21 – 2020-06-22 (×2): 6 via TOPICAL

## 2020-06-20 MED ORDER — IOHEXOL 300 MG/ML  SOLN
100.0000 mL | Freq: Once | INTRAMUSCULAR | Status: AC | PRN
  Administered 2020-06-20: 100 mL via INTRAVENOUS

## 2020-06-20 MED ORDER — SODIUM CHLORIDE 0.9 % IV SOLN: INTRAVENOUS | Status: AC

## 2020-06-20 MED ORDER — POTASSIUM CHLORIDE CRYS ER 20 MEQ PO TBCR
40.0000 meq | EXTENDED_RELEASE_TABLET | Freq: Once | ORAL | Status: AC
  Administered 2020-06-20: 40 meq via ORAL
  Filled 2020-06-20: qty 2

## 2020-06-20 NOTE — ED Provider Notes (Signed)
[facility] EMERGENCY DEPARTMENT Provider Note   CSN: [id number] Arrival date & time: 06/20/20  9629     History Chief Complaint  Patient presents with  . Hematuria    Eddie Mendoza is a 70 y.o. male.  HPI   This patient is a 70 year old male, there is a reported history of hypertension, the patient has no medications except for chlorthalidone which he takes daily, it is reported that he takes an aspirin daily but the patient denies this to me.  Chief complaint is hematuria  The patient reports that over the last couple of days he has been seeing some hematuria but overnight he has had nothing but frank blood and clots and now is finding that he cannot urinate.  He is having severe discomfort, he has penile pain, bladder pain, lower abdominal pain and fullness.  He states that this is quite severe, he has never had anything like this, he denies any trauma or injury, denies taking any anticoagulants.  Denies any prior history of urinary tract infections and has not had any fevers chills nausea or vomiting.  He has not been seen by a medical professional for this illness prior to this visit today.  Past Medical History:  Diagnosis Date  . Hypertension     There are no problems to display for this patient.   History reviewed. No pertinent surgical history.     No family history on file.  Social History   Tobacco Use  . Smoking status: Current Every Day Smoker    Packs/day: 0.50    Years: 30.00    Pack years: 15.00    Types: Cigarettes  . Smokeless tobacco: Never Used  Substance Use Topics  . Alcohol use: Yes    Alcohol/week: 3.0 standard drinks    Types: 3 Shots of liquor per week  . Drug use: No    Home Medications Prior to Admission medications   Medication Sig Start Date End Date Taking? Authorizing Provider  chlorthalidone (HYGROTON) 25 MG tablet Take 25 mg by mouth daily.   Yes [provider]  colchicine 0.6 MG tablet Take 0.6mg  (one tablet) by  mouth every 1-2 hours until one of the following occurs: 1.  The pain is gone 2.  The maximum dose has been given ( no more than 3 tabs in 3 hours or 10 tabs in 24 hours) 3.  The side effects outweight the benefits 09/25/15  Yes Pisciotta, Elmyra Ricks, PA-C  lidocaine (XYLOCAINE) 5 % ointment APPLY SMALL AMOUNT TO AFFECTED AREA FOUR TIMES A DAY AS NEEDED 04/15/20  Yes [provider]  losartan (COZAAR) 25 MG tablet Take 12.5 mg by mouth daily.   Yes [provider]  nicotine polacrilex (COMMIT) 2 MG lozenge DISSOLVE 1 LOZENGE IN MOUTH EVERY FOUR HOURS AS NEEDED 01/29/20  Yes [provider]    Allergies    Patient has no known allergies.  Review of Systems   Review of Systems  Constitutional: Negative for chills and fever.  HENT: Negative for sore throat.   Eyes: Negative for visual disturbance.  Respiratory: Negative for cough and shortness of breath.   Cardiovascular: Negative for chest pain.  Gastrointestinal: Positive for abdominal pain. Negative for diarrhea, nausea and vomiting.  Genitourinary: Positive for frequency and hematuria. Negative for dysuria.  Musculoskeletal: Negative for back pain and neck pain.  Skin: Negative for rash.  Neurological: Negative for weakness, numbness and headaches.  Hematological: Negative for adenopathy.  Psychiatric/Behavioral: Negative for behavioral problems.  Physical Exam Updated Vital Signs BP (!) 165/70   Pulse 89   Temp 97.8 F (36.6 C) (Oral)   Resp 12   Ht 1.829 m (6')   Wt 95.3 kg   SpO2 100%   BMI 28.48 kg/m   Physical Exam Vitals and nursing note reviewed.  Constitutional:      General: He is not in acute distress.    Appearance: He is well-developed.  HENT:     Head: Normocephalic and atraumatic.     Mouth/Throat:     Pharynx: No oropharyngeal exudate.  Eyes:     General: No scleral icterus.       Right eye: No discharge.        Left eye: No discharge.     Conjunctiva/sclera: Conjunctivae  normal.     Pupils: Pupils are equal, round, and reactive to light.  Neck:     Thyroid: No thyromegaly.     Vascular: No JVD.  Cardiovascular:     Rate and Rhythm: Normal rate and regular rhythm.     Heart sounds: Normal heart sounds. No murmur heard. No friction rub. No gallop.   Pulmonary:     Effort: Pulmonary effort is normal. No respiratory distress.     Breath sounds: Normal breath sounds. No wheezing or rales.  Abdominal:     General: Bowel sounds are normal. There is no distension.     Palpations: Abdomen is soft. There is no mass.     Tenderness: There is abdominal tenderness.     Comments: Tender to palpation in the lower abdomen, nonperitoneal, no other abdominal tenderness  Genitourinary:    Comments: Normal-appearing penis scrotum and testicles, there is frank blood at the urethral meatus Musculoskeletal:        General: No tenderness. Normal range of motion.     Cervical back: Normal range of motion and neck supple.  Lymphadenopathy:     Cervical: No cervical adenopathy.  Skin:    General: Skin is warm and dry.     Findings: No erythema or rash.  Neurological:     Mental Status: He is alert.     Coordination: Coordination normal.  Psychiatric:        Behavior: Behavior normal.     ED Results / Procedures / Treatments   Labs (all labs ordered are listed, but only abnormal results are displayed) Labs Reviewed  BASIC METABOLIC PANEL - Abnormal; Notable for the following components:      Result Value   CO2 20 (*)    Glucose, Bld 167 (*)    Calcium 8.8 (*)    All other components within normal limits  CBC WITH DIFFERENTIAL/PLATELET - Abnormal; Notable for the following components:   WBC 12.6 (*)    RBC 3.60 (*)    Hemoglobin 11.7 (*)    HCT 34.3 (*)    Neutro Abs 10.2 (*)    Abs Immature Granulocytes 0.08 (*)    All other components within normal limits  URINE CULTURE  PROTIME-INR  URINALYSIS, ROUTINE W REFLEX MICROSCOPIC     EKG None  Radiology CT ABDOMEN PELVIS W CONTRAST  Result Date: 06/20/2020 CLINICAL DATA:  Groin pain, bladder hemorrhage. EXAM: CT ABDOMEN AND PELVIS WITH CONTRAST TECHNIQUE: Multidetector CT imaging of the abdomen and pelvis was performed using the standard protocol following bolus administration of intravenous contrast. CONTRAST:  192mL OMNIPAQUE IOHEXOL 300 MG/ML  SOLN COMPARISON:  None available FINDINGS: Lower chest: Lung bases are clear. Hepatobiliary: No focal hepatic  lesion. There is thickening of the gallbladder wall through the neck of the gallbladder over a 3 cm segment (image 28/series 2) potential non radiodense 9 calculus within the lumen of the gallbladder. No evidence acute cholecystitis. Common bile duct normal caliber Pancreas: Pancreas is normal. No ductal dilatation. No pancreatic inflammation. Spleen: Normal spleen Adrenals/urinary tract: Adrenal glands normal. No enhancing renal cortical lesion. Mild bilateral hydroureter. No obstructing lesion identified. There is a Foley catheter within the lumen of the bladder. The Foley catheter is position above a markedly enlarged prostate gland. There is a large volume of high-density fluid within the lumen of the bladder consistent with hematoma/hemorrhage. The bladder is distended measuring 14.8 x 9.4 by 11.4 cm (volume = 830 cm^3). The entirety of the lumen of the distended bladder is near completely filled with blood product. Small amount a gas along the dome of the bladder. Several small diverticula at this level. Stomach/Bowel: Stomach, small bowel, appendix, and cecum are normal. The colon and rectosigmoid colon are normal. Vascular/Lymphatic: Abdominal aorta is normal caliber with atherosclerotic calcification. There is no retroperitoneal or periportal lymphadenopathy. No pelvic lymphadenopathy. Reproductive: Enlarged prostate gland measures 7.5 by 8.0 by 5.8 cm (volume = 180 cm^3) Other: No free fluid. Musculoskeletal: Degenerative  osteophytosis of the spine. IMPRESSION: 1. The lumen of the distended bladder is near completely filled with blood product. Bladder distended to a volume of 830 cubic cm. Foley catheter within lumen of the bladder position above and enlarged prostate gland. 2. Mural thickening through the neck of the gallbladder. Potential non radiodense stones within the fundus of the gallbladder. Mural thickening may represent benign collapse the bladder; however, cannot exclude a mucosal lesion or neoplasm. Recommend contrast MRI of the gallbladder on a nonemergent basis. Electronically Signed   By: Suzy Bouchard M.D.   On: 06/20/2020 11:34    Procedures BLADDER CATHETERIZATION  Date/Time: 06/20/2020 2:57 PM Performed by: Noemi Chapel, MD Authorized by: Noemi Chapel, MD   Consent:    Consent obtained:  Verbal   Consent given by:  Patient   Risks, benefits, and alternatives were discussed: yes     Risks discussed:  False passage, infection, urethral injury, incomplete procedure and pain   Alternatives discussed:  No treatment Universal protocol:    Procedure explained and questions answered to patient or proxy's satisfaction: yes     Relevant documents present and verified: yes     Test results available: yes     Imaging studies available: yes     Required blood products, implants, devices, and special equipment available: yes     Site/side marked: yes     Immediately prior to procedure, a time out was called: yes     Patient identity confirmed:  Verbally with patient and arm band Pre-procedure details:    Procedure purpose:  Therapeutic   Preparation: Patient was prepped and draped in usual sterile fashion   Anesthesia:    Anesthesia method:  None Procedure details:    Provider performed due to:  Nurse unable to complete and complicated insertion   Catheter insertion:  Temporary indwelling   Catheter type:  Triple lumen   Catheter size:  22 Fr   Bladder irrigation: yes     Number of  attempts:  1   Urine characteristics:  Bloody Post-procedure details:    Procedure completion:  Tolerated well, no immediate complications Comments:           Medications Ordered in ED Medications  iohexol (OMNIPAQUE) 300 MG/ML  solution 100 mL (100 mLs Intravenous Contrast Given 06/20/20 1116)    ED Course  I have reviewed the triage vital signs and the nursing notes.  Pertinent labs & imaging results that were available during my care of the patient were reviewed by me and considered in my medical decision making (see chart for details).    MDM Rules/Calculators/A&P                          The patient is very uncomfortable appearing, this is likely related to a bladder outlet obstruction, the cause is unclear, there is a significant amount of blood, will place Foley catheter and irrigate, send urine sample and get CT scan of abdomen and pelvis.  Foley catheter placed, irrigating at this time due to significant blood and clot  I discussed the care with Dr. Diona Fanti of the urology service, he agrees with bladder irrigation and will come see the patient.  There is gross blood being irrigated from the bladder at this time.  Change of shift, care signed out to oncoming EDP to follow-up urology recommendations  Final Clinical Impression(s) / ED Diagnoses Final diagnoses:  Gross hematuria      Noemi Chapel, MD 06/20/20 1459

## 2020-06-20 NOTE — Consult Note (Signed)
Urology Consult  Consulting MD: Noemi Chapel, MD  CC: Gross hematuria  HPI: This is a 70year old male with no known urologic history presenting to the emergency room here at [facility] earlier today with 2 to 3-day history of gross hematuria.  The patient has had feeling of incomplete emptying, passing clots, having frequency, urgency and pain with urination.  He has had no fever or chills.  Patient states that actually he noticed a little bit of blood in his urine about 2 weeks ago.  He is not on any antiplatelet therapy, but does take Goody's powders on a regular basis.  Usually 2-3 times a day.  He is a smoker for 30 to 40 years.  CT scan revealed no abnormalities of the upper tracts.  His bladder was overdistended, prostate was very large and there was obvious blood in the bladder.  A two-way catheter was initially placed with incomplete emptying and then replaced with a 22 French three-way Foley catheter.  Vigorous irrigation was performed by the able emergency room staff, getting a fair amount of clot.  He is now on CBI.  Urologic consultation is requested due to active bleeding and patient discomfort.  PMH: Past Medical History:  Diagnosis Date  . Hypertension     PSH: History reviewed. No pertinent surgical history.  Allergies: No Known Allergies  Medications: The patient is on losartan, also takes [place] powders regularly   Social History: Social History   Socioeconomic History  . Marital status: Divorced    Spouse name: Not on file  . Number of children: Not on file  . Years of education: Not on file  . Highest education level: Not on file  Occupational History  . Not on file  Tobacco Use  . Smoking status: Current Every Day Smoker    Packs/day: 0.50    Years: 30.00    Pack years: 15.00    Types: Cigarettes  . Smokeless tobacco: Never Used  Substance and Sexual Activity  . Alcohol use: Yes    Alcohol/week: 3.0 standard drinks    Types: 3 Shots of  liquor per week  . Drug use: No  . Sexual activity: Not on file  Other Topics Concern  . Not on file  Social History Narrative  . Not on file   Social Determinants of Health   Financial Resource Strain: Not on file  Food Insecurity: Not on file  Transportation Needs: Not on file  Physical Activity: Not on file  Stress: Not on file  Social Connections: Not on file  Intimate Partner Violence: Not on file    Family History: No family history on file.  Review of Systems: Positive: Gross hematuria, frequency, urgency, bladder pain, feeling of incomplete emptying Negative:   A further 10 point review of systems was negative except what is listed in the HPI.  Physical Exam: @VITALS2 @ General: Moderate distress secondary to bladder pain Head:  Normocephalic.  Atraumatic. ENT:  EOMI.  Mucous membranes moist Neck:  Supple.  No lymphadenopathy. CV:  Regular rate. Pulmonary: Equal effort bilaterally.   Abdomen: Soft.  Bladder palpable above the pubic symphysis Skin:  Normal turgor.  No visible rash. Extremity: No gross deformity of extremities.  Neurologic: Alert. Appropriate mood. Penis:  Circumcised.  No lesions.  Blood present at meatus Urethra: Orthotopic meatus. Scrotum: No lesions.  No ecchymosis.  No erythema. Testicles: Descended bilaterally.  No masses bilaterally. Epididymis: Palpable bilaterally.  Non Tender to palpation.  Studies:  Recent Labs  06/20/20 0949  HGB 11.7*  WBC 12.6*  PLT 340    Recent Labs    06/20/20 0949  NA 139  K 3.6  CL 106  CO2 20*  BUN 15  CREATININE 1.02  CALCIUM 8.8*  GFRNONAA >60     Recent Labs    06/20/20 0949  INR 1.0     I reviewed the patient's CT images independently  Procedure note: With the 22 French three-way Foley catheter in place, I vigorously irrigated with 1200 cc of saline.  This liberated approximately 400 mL of clot.  I could still hand irrigate, but there was never clear effluent.  I then removed  the 62 French Foley catheter, and under sterile conditions placed a 24 [place] coud tip hematuria catheter, three-way.  I was unable to irrigate another 3 to 400 mL of clot.  At this point, all clot was removed and effluent was clear.  This was then hooked to CBI.  Patient had significant bladder spasms during this irrigation and he was given a milligram of Dilaudid which adequately controlled his pain   Assessment: Gross hematuria.  Etiologies could include just a large prostate with venous bleeding, propagated by him being on Goody's powders, or urothelial malignancy in the bladder.  The patient's clot has been evacuated with large gauge catheter.  This is now on CBI.  Plan: 1.  I would feel more comfortable with the patient being transferred to [facility] fourth floor, the progressive care/urology floor.  The patient will need CBI, which I do not think can be adequately administered in this hospital over the weekend  2.  I would recommend he be admitted to the hospital service.  Obviously, serial hemoglobin should be done as he did have significant blood loss.  3.  I will cover his instrumentation/irrigation with Rocephin  4.  I will follow once in [place].  5.  Thank you for your help with this nice gentleman.    Pager:[phone]

## 2020-06-20 NOTE — ED Notes (Signed)
Attempted to place three way foley, met resistance.   Another nurse to attempt without success.  EDP has been made aware.

## 2020-06-20 NOTE — Plan of Care (Signed)

## 2020-06-20 NOTE — ED Provider Notes (Signed)
Dr Diona Fanti, urology has evaluated, and requests admission to hospitalist service at [facility], continuous bladder irrigation on urology floor there, serial hgb checks.  Hospitalists consulted for admission.     Lajean Saver, MD 06/20/20 1630

## 2020-06-20 NOTE — H&P (Signed)
History and Physical    Eddie Mendoza QPY:[id number] DOB: Aug 16, 1950 DOA: 06/20/2020  PCP: [facility]   Patient coming from: Home  I have personally briefly reviewed patient's old medical records in [place]  Chief Complaint: Blood urine, abdominal pain  HPI: Eddie Mendoza is a 70 y.o. male with medical history significant for  HTN, who presented to the ED with complaints of blood in his urine started about 2 to 3 days ago.  Symptoms initially started with some blood in his urine, and progressed to seeing frank blood and then blood with clots.  Last night he started having significant lower abdominal pain and he was unable to urinate.  He denies any prior episodes.  Denies bleeding from any other orifices.  Does not take aspirin, but takes Goody powders.  Reports an episode of dizziness today while getting into the ED but none since.  No chest pain.  He denies any other complaints.   Smokes cigarettes.  Drinks alcohol-a shot of whiskey or Shearon Stalls-  ~ 3 times a week.  ED Course: Heart rate 89-92, blood pressure systolic 124P to 809X.  Hemoglobin 11.7.  Platelets 340.  WBC 12.6.  Unremarkable BMP.  Abdominal/pelvic CT with contrast-distended bladder nearly completely filled with blood products, volume 830 cubic cc.  Enlarged prostate.   Foley catheter and vigorous bladder irrigation started in the ED, with fair amounts of blood clot drained from bladder.  Urology consulted, evaluated in ED, recommended admission to [facility] progressive/urology floor.  Review of Systems: As per HPI all other systems reviewed and negative.  Past Medical History:  Diagnosis Date  . Hypertension     History reviewed. No pertinent surgical history.   reports that he has been smoking cigarettes. He has a 15.00 pack-year smoking history. He has never used smokeless tobacco. He reports current alcohol use of about 3.0 standard drinks of alcohol per week. He reports that he  does not use drugs.  No Known Allergies  Family history of hypertension.  Prior to Admission medications   Medication Sig Start Date End Date Taking? Authorizing Provider  chlorthalidone (HYGROTON) 25 MG tablet Take 25 mg by mouth daily.   Yes [provider]  colchicine 0.6 MG tablet Take 0.6mg  (one tablet) by mouth every 1-2 hours until one of the following occurs: 1.  The pain is gone 2.  The maximum dose has been given ( no more than 3 tabs in 3 hours or 10 tabs in 24 hours) 3.  The side effects outweight the benefits 09/25/15  Yes Pisciotta, Elmyra Ricks, PA-C  lidocaine (XYLOCAINE) 5 % ointment APPLY SMALL AMOUNT TO AFFECTED AREA FOUR TIMES A DAY AS NEEDED 04/15/20  Yes [provider]  losartan (COZAAR) 25 MG tablet Take 12.5 mg by mouth daily.   Yes [provider]  nicotine polacrilex (COMMIT) 2 MG lozenge DISSOLVE 1 LOZENGE IN MOUTH EVERY FOUR HOURS AS NEEDED 01/29/20  Yes [provider]    Physical Exam: Vitals:   06/20/20 0928 06/20/20 1135 06/20/20 1430 06/20/20 1432  BP: (!) 147/80 (!) 166/79 (!) 165/70   Pulse: 91  89   Resp: 13   12  Temp:      TempSrc:      SpO2: 100%  100%   Weight:      Height:        Constitutional: NAD, calm, comfortable Vitals:   06/20/20 0928 06/20/20 1135 06/20/20 1430 06/20/20 1432  BP: Marland Kitchen)  147/80 (!) 166/79 (!) 165/70   Pulse: 91  89   Resp: 13   12  Temp:      TempSrc:      SpO2: 100%  100%   Weight:      Height:       Eyes: PERRL, lids and conjunctivae normal ENMT: Mucous membranes are moist. Neck: normal, supple, no masses, no thyromegaly Respiratory: clear to auscultation bilaterally, no wheezing, no crackles. Normal respiratory effort. No accessory muscle use.  Cardiovascular: Regular rate and rhythm,  No extremity edema. 2+ pedal pulses.   Abdomen: no tenderness, no masses palpated. No hepatosplenomegaly. Bowel sounds positive.  Foley catheter in situ, urine bag with ~ 241mls blood-tinged  urine. Musculoskeletal: no clubbing / cyanosis. No joint deformity upper and lower extremities. Good ROM, no contractures. Normal muscle tone.  Skin: no rashes, lesions, ulcers. No induration Neurologic: No apparent cranial normality, moving extremities spontaneously Psychiatric: Normal judgment and insight. Alert and oriented x 3. Normal mood.   Labs on Admission: I have personally reviewed following labs and imaging studies  CBC: Recent Labs  Lab 06/20/20 0949  WBC 12.6*  NEUTROABS 10.2*  HGB 11.7*  HCT 34.3*  MCV 95.3  PLT 638   Basic Metabolic Panel: Recent Labs  Lab 06/20/20 0949  NA 139  K 3.6  CL 106  CO2 20*  GLUCOSE 167*  BUN 15  CREATININE 1.02  CALCIUM 8.8*   Coagulation Profile: Recent Labs  Lab 06/20/20 0949  INR 1.0    Radiological Exams on Admission: CT ABDOMEN PELVIS W CONTRAST  Result Date: 06/20/2020 CLINICAL DATA:  Groin pain, bladder hemorrhage. EXAM: CT ABDOMEN AND PELVIS WITH CONTRAST TECHNIQUE: Multidetector CT imaging of the abdomen and pelvis was performed using the standard protocol following bolus administration of intravenous contrast. CONTRAST:  147mL OMNIPAQUE IOHEXOL 300 MG/ML  SOLN COMPARISON:  None available FINDINGS: Lower chest: Lung bases are clear. Hepatobiliary: No focal hepatic lesion. There is thickening of the gallbladder wall through the neck of the gallbladder over a 3 cm segment (image 28/series 2) potential non radiodense 9 calculus within the lumen of the gallbladder. No evidence acute cholecystitis. Common bile duct normal caliber Pancreas: Pancreas is normal. No ductal dilatation. No pancreatic inflammation. Spleen: Normal spleen Adrenals/urinary tract: Adrenal glands normal. No enhancing renal cortical lesion. Mild bilateral hydroureter. No obstructing lesion identified. There is a Foley catheter within the lumen of the bladder. The Foley catheter is position above a markedly enlarged prostate gland. There is a large volume of  high-density fluid within the lumen of the bladder consistent with hematoma/hemorrhage. The bladder is distended measuring 14.8 x 9.4 by 11.4 cm (volume = 830 cm^3). The entirety of the lumen of the distended bladder is near completely filled with blood product. Small amount a gas along the dome of the bladder. Several small diverticula at this level. Stomach/Bowel: Stomach, small bowel, appendix, and cecum are normal. The colon and rectosigmoid colon are normal. Vascular/Lymphatic: Abdominal aorta is normal caliber with atherosclerotic calcification. There is no retroperitoneal or periportal lymphadenopathy. No pelvic lymphadenopathy. Reproductive: Enlarged prostate gland measures 7.5 by 8.0 by 5.8 cm (volume = 180 cm^3) Other: No free fluid. Musculoskeletal: Degenerative osteophytosis of the spine. IMPRESSION: 1. The lumen of the distended bladder is near completely filled with blood product. Bladder distended to a volume of 830 cubic cm. Foley catheter within lumen of the bladder position above and enlarged prostate gland. 2. Mural thickening through the neck of the gallbladder. Potential non  radiodense stones within the fundus of the gallbladder. Mural thickening may represent benign collapse the bladder; however, cannot exclude a mucosal lesion or neoplasm. Recommend contrast MRI of the gallbladder on a nonemergent basis. Electronically Signed   By: Suzy Bouchard M.D.   On: 06/20/2020 11:34    EKG: None  Assessment/Plan Principal Problem:   Hematuria Active Problems:   HTN (hypertension)  Hematuria-with frank blood and blood clots, bladder irrigation initiated in ED. Normal platelets and INR.  Abdominal CT showed distended bladder with blood products, 830 cubic cm, enlarged prostate..  Hemoglobin 11.7 baseline ~ 12.  -Urology consulted in ED, (see consult note), recommended admission to [facility]/urology floor.  Etiologies- enlarge prostate with venous bleeding or urothelial malignancy.   Continue continuous bladder irrigation.  -Monitor Hemoglobin closely Q6hr x 4 -  N/s  100cc/hr x 20hrs. -IV morphine 4mg  as needed for pain - Hold Antihypertensives for now in the setting of acute bleeding - Follow up UA and Urine Culture  Hypertension-systolic 480X to 655V. -Reports compliance with chlorthalidone and losartan, hold for now in the setting of significant hematuria -As needed labetalol for systolic greater than 748.  Incidental gallbladder findings- Mural thickening through the neck of the gallbladder. Potential non radiodense stones within the fundus of the gallbladder. Mural thickening may represent benign collapse the bladder; however, cannot exclude a mucosal lesion or neoplasm. Recommend contrast MRI of the gallbladder on a nonemergent basis. - Will need follow up.  Tobacco use - smokes 1 pack cigarette every ~ 3- 4 days. - nicotine patch  DVT prophylaxis: SCDS Code Status: Full code Family Communication: Significant other- fianc at bedside Disposition Plan: ~/ > 2 days, pending urology evaluation to determine etiology of hematuria. Consults called: Urology Admission status: Inpt, Step down I certify that at the point of admission it is my clinical judgment that the patient will require inpatient hospital care spanning beyond 2 midnights from the point of admission due to high intensity of service, high risk for further deterioration and high frequency of surveillance required.    Bethena Roys MD [facility]  06/20/2020, 5:45 PM

## 2020-06-20 NOTE — ED Triage Notes (Signed)
Pt presents to ED with complaints of hematuria and groin pain since last night.

## 2020-06-21 DIAGNOSIS — Z720 Tobacco use: Secondary | ICD-10-CM

## 2020-06-21 LAB — BASIC METABOLIC PANEL
Anion gap: 6 (ref 5–15)
BUN: 10 mg/dL (ref 8–23)
CO2: 25 mmol/L (ref 22–32)
Calcium: 8.4 mg/dL — ABNORMAL LOW (ref 8.9–10.3)
Chloride: 106 mmol/L (ref 98–111)
Creatinine, Ser: 0.93 mg/dL (ref 0.61–1.24)
GFR, Estimated: >60 mL/min (ref >60)
Glucose, Bld: 124 mg/dL — ABNORMAL HIGH (ref 70–99)
Potassium: 3.9 mmol/L (ref 3.5–5.1)
Sodium: 137 mmol/L (ref 135–145)

## 2020-06-21 LAB — CBC
HCT: 27 % — ABNORMAL LOW (ref 39.0–52.0)
HCT: 27.6 % — ABNORMAL LOW (ref 39.0–52.0)
HCT: 26.5 % — ABNORMAL LOW (ref 39.0–52.0)
HCT: 28.3 % — ABNORMAL LOW (ref 39.0–52.0)
Hemoglobin: 9.2 g/dL — ABNORMAL LOW (ref 13.0–17.0)
Hemoglobin: 9.1 g/dL — ABNORMAL LOW (ref 13.0–17.0)
Hemoglobin: 8.8 g/dL — ABNORMAL LOW (ref 13.0–17.0)
Hemoglobin: 9.2 g/dL — ABNORMAL LOW (ref 13.0–17.0)
MCH: 32.6 pg (ref 26.0–34.0)
MCH: 31.9 pg (ref 26.0–34.0)
MCH: 32.4 pg (ref 26.0–34.0)
MCH: 31.9 pg (ref 26.0–34.0)
MCHC: 34.1 g/dL (ref 30.0–36.0)
MCHC: 33 g/dL (ref 30.0–36.0)
MCHC: 33.2 g/dL (ref 30.0–36.0)
MCHC: 32.5 g/dL (ref 30.0–36.0)
MCV: 95.7 fL (ref 80.0–100.0)
MCV: 96.8 fL (ref 80.0–100.0)
MCV: 97.4 fL (ref 80.0–100.0)
MCV: 98.3 fL (ref 80.0–100.0)
Platelets: 269 10*3/uL (ref 150–400)
Platelets: 270 10*3/uL (ref 150–400)
Platelets: 256 10*3/uL (ref 150–400)
Platelets: 276 10*3/uL (ref 150–400)
RBC: 2.82 MIL/uL — ABNORMAL LOW (ref 4.22–5.81)
RBC: 2.85 MIL/uL — ABNORMAL LOW (ref 4.22–5.81)
RBC: 2.72 MIL/uL — ABNORMAL LOW (ref 4.22–5.81)
RBC: 2.88 MIL/uL — ABNORMAL LOW (ref 4.22–5.81)
RDW: 14.1 % (ref 11.5–15.5)
RDW: 14 % (ref 11.5–15.5)
RDW: 14.2 % (ref 11.5–15.5)
RDW: 14 % (ref 11.5–15.5)
WBC: 13.2 10*3/uL — ABNORMAL HIGH (ref 4.0–10.5)
WBC: 11.9 10*3/uL — ABNORMAL HIGH (ref 4.0–10.5)
WBC: 12 10*3/uL — ABNORMAL HIGH (ref 4.0–10.5)
WBC: 14.2 10*3/uL — ABNORMAL HIGH (ref 4.0–10.5)
nRBC: 0 % (ref 0.0–0.2)
nRBC: 0 % (ref 0.0–0.2)
nRBC: 0 % (ref 0.0–0.2)
nRBC: 0 % (ref 0.0–0.2)

## 2020-06-21 LAB — SARS CORONAVIRUS 2 (TAT 6-24 HRS): SARS Coronavirus 2: NEGATIVE

## 2020-06-21 MED ORDER — LOSARTAN POTASSIUM 25 MG PO TABS
12.5000 mg | ORAL_TABLET | Freq: Every day | ORAL | Status: DC
  Administered 2020-06-21 – 2020-06-22 (×2): 12.5 mg via ORAL
  Filled 2020-06-21 (×2): qty 1

## 2020-06-21 MED ORDER — LOSARTAN POTASSIUM 25 MG PO TABS: 12.5000 mg | ORAL_TABLET | Freq: Every day | ORAL | Status: DC

## 2020-06-21 MED ORDER — SODIUM CHLORIDE 0.9 % IV SOLN
1.0000 g | INTRAVENOUS | Status: DC
  Filled 2020-06-21: qty 10

## 2020-06-21 MED ORDER — COVID-19 MRNA VAC-TRIS(PFIZER) 30 MCG/0.3ML IM SUSP
0.3000 mL | Freq: Once | INTRAMUSCULAR | Status: AC
  Administered 2020-06-21: 0.3 mL via INTRAMUSCULAR
  Filled 2020-06-21: qty 0.3

## 2020-06-21 MED ORDER — CHLORTHALIDONE 25 MG PO TABS
25.0000 mg | ORAL_TABLET | Freq: Every day | ORAL | Status: DC
  Administered 2020-06-21 – 2020-06-22 (×2): 25 mg via ORAL
  Filled 2020-06-21 (×2): qty 1

## 2020-06-21 MED ORDER — CHLORTHALIDONE 25 MG PO TABS: 25.0000 mg | ORAL_TABLET | Freq: Every day | ORAL | Status: DC

## 2020-06-21 NOTE — Progress Notes (Signed)
Subjective: Patient reports that he had a decent night.  He is not in any pain.  Apparently, no need for hand irrigation overnight.  Tolerating diet well.  Objective: Vital signs in last 24 hours: Temp:  [98.6 F (37 C)-99.4 F (37.4 C)] 98.6 F (37 C) (04/03 1010) Pulse Rate:  [69-89] 74 (04/03 1010) Resp:  [12-21] 16 (04/03 1010) BP: (132-166)/(63-90) 140/69 (04/03 1010) SpO2:  [98 %-100 %] 100 % (04/03 1010)  Intake/Output from previous day: 04/02 0701 - 04/03 0700 In: 4149.1 [P.O.:240; I.V.:809.1; IV Piggyback:100] Out: 2 [Urine:6600] Intake/Output this shift: Total I/O In: 360 [P.O.:360] Out: 1100 [Urine:1100]  Physical Exam:  Constitutional: Vital signs reviewed. WD WN in NAD   Eyes: PERRL, No scleral icterus.   Cardiovascular: RRR Pulmonary/Chest: Normal effort Extremities: No cyanosis or edema  CBI effluent clear  Lab Results: Recent Labs    06/20/20 1734 06/21/20 0012 06/21/20 0532  HGB 11.1* 9.2* 9.1*  HCT 32.7* 27.0* 27.6*   BMET Recent Labs    06/20/20 0949 06/21/20 0532  NA 139 137  K 3.6 3.9  CL 106 106  CO2 20* 25  GLUCOSE 167* 124*  BUN 15 10  CREATININE 1.02 0.93  CALCIUM 8.8* 8.4*   Recent Labs    06/20/20 0949  INR 1.0   No results for input(s): LABURIN in the last 72 hours. Results for orders placed or performed during the hospital encounter of 06/20/20  SARS CORONAVIRUS 2 (TAT 6-24 HRS) Nasopharyngeal Nasopharyngeal Swab     Status: None   Collection Time: 06/20/20  5:26 PM   Specimen: Nasopharyngeal Swab  Result Value Ref Range Status   SARS Coronavirus 2 NEGATIVE NEGATIVE Final    Comment: (NOTE) SARS-CoV-2 target nucleic acids are NOT DETECTED.  The SARS-CoV-2 RNA is generally detectable in upper and lower respiratory specimens during the acute phase of infection. Negative results do not preclude SARS-CoV-2 infection, do not rule out co-infections with other pathogens, and should not be used as the sole basis for  treatment or other patient management decisions. Negative results must be combined with clinical observations, patient history, and epidemiological information. The expected result is Negative.  Fact Sheet for Patients: SugarRoll.be  Fact Sheet for Healthcare Providers: https://www.woods-mathews.com/  This test is not yet approved or cleared by the [place] FDA and  has been authorized for detection and/or diagnosis of SARS-CoV-2 by FDA under an Emergency Use Authorization (EUA). This EUA will remain  in effect (meaning this test can be used) for the duration of the COVID-19 declaration under Se ction 564(b)(1) of the Act, 21 U.S.C. section 360bbb-3(b)(1), unless the authorization is terminated or revoked sooner.  Performed at [facility], [place] [address] [place], [place] [address]     Studies/Results: CT ABDOMEN PELVIS W CONTRAST  Result Date: 06/20/2020 CLINICAL DATA:  Groin pain, bladder hemorrhage. EXAM: CT ABDOMEN AND PELVIS WITH CONTRAST TECHNIQUE: Multidetector CT imaging of the abdomen and pelvis was performed using the standard protocol following bolus administration of intravenous contrast. CONTRAST:  17mL OMNIPAQUE IOHEXOL 300 MG/ML  SOLN COMPARISON:  None available FINDINGS: Lower chest: Lung bases are clear. Hepatobiliary: No focal hepatic lesion. There is thickening of the gallbladder wall through the neck of the gallbladder over a 3 cm segment (image 28/series 2) potential non radiodense 9 calculus within the lumen of the gallbladder. No evidence acute cholecystitis. Common bile duct normal caliber Pancreas: Pancreas is normal. No ductal dilatation. No pancreatic inflammation. Spleen: Normal spleen Adrenals/urinary tract:  Adrenal glands normal. No enhancing renal cortical lesion. Mild bilateral hydroureter. No obstructing lesion identified. There is a Foley catheter within the lumen of the bladder. The Foley catheter is  position above a markedly enlarged prostate gland. There is a large volume of high-density fluid within the lumen of the bladder consistent with hematoma/hemorrhage. The bladder is distended measuring 14.8 x 9.4 by 11.4 cm (volume = 830 cm^3). The entirety of the lumen of the distended bladder is near completely filled with blood product. Small amount a gas along the dome of the bladder. Several small diverticula at this level. Stomach/Bowel: Stomach, small bowel, appendix, and cecum are normal. The colon and rectosigmoid colon are normal. Vascular/Lymphatic: Abdominal aorta is normal caliber with atherosclerotic calcification. There is no retroperitoneal or periportal lymphadenopathy. No pelvic lymphadenopathy. Reproductive: Enlarged prostate gland measures 7.5 by 8.0 by 5.8 cm (volume = 180 cm^3) Other: No free fluid. Musculoskeletal: Degenerative osteophytosis of the spine. IMPRESSION: 1. The lumen of the distended bladder is near completely filled with blood product. Bladder distended to a volume of 830 cubic cm. Foley catheter within lumen of the bladder position above and enlarged prostate gland. 2. Mural thickening through the neck of the gallbladder. Potential non radiodense stones within the fundus of the gallbladder. Mural thickening may represent benign collapse the bladder; however, cannot exclude a mucosal lesion or neoplasm. Recommend contrast MRI of the gallbladder on a nonemergent basis. Electronically Signed   By: Suzy Bouchard M.D.   On: 06/20/2020 11:34    Assessment/Plan:   Gross hematuria, significant with three-point hemoglobin drop.  Vigorous irrigation yesterday, continues with CBI which seems to be doing quite well.    CT stone protocol in the emergency room yesterday did not reveal upper tract abnormalities.  Large amount of blood provided adequate bladder evaluation.  Plan will be to stop CBI today, we will discontinue his Foley catheter in the morning.  If he voids at  that time, I think it is fine to go home.  He will need contrasted CT scan of the abdomen and pelvis and eventual cystoscopy.  He needs to be coaxed to stop the Kramer powders.   LOS: 1 day   Jorja Loa 06/21/2020, 10:35 AM

## 2020-06-21 NOTE — Progress Notes (Signed)
PROGRESS NOTE    Eddie Mendoza  OEV:[id number] DOB: 10-02-1950 DOA: 06/20/2020 PCP: [facility]     Brief Narrative:   Eddie Mendoza is a 70 y.o. BM PMHx  HTN,  Tobacco abuse,   Presented to the ED with complaints of blood in his urine started about 2 to 3 days ago.  Symptoms initially started with some blood in his urine, and progressed to seeing frank blood and then blood with clots.  Last night he started having significant lower abdominal pain and he was unable to urinate.  He denies any prior episodes.  Denies bleeding from any other orifices.  Does not take aspirin, but takes Goody powders.  Reports an episode of dizziness today while getting into the ED but none since.  No chest pain.  He denies any other complaints.   Smokes cigarettes.  Drinks alcohol-a shot of whiskey or Shearon Stalls-  ~ 3 times a week.  ED Course: Heart rate 89-92, blood pressure systolic 829H to 371I.  Hemoglobin 11.7.  Platelets 340.  WBC 12.6.  Unremarkable BMP.  Abdominal/pelvic CT with contrast-distended bladder nearly completely filled with blood products, volume 830 cubic cc.  Enlarged prostate.   Foley catheter and vigorous bladder irrigation started in the ED, with fair amounts of blood clot drained from bladder.  Urology consulted, evaluated in ED, recommended admission to [facility] progressive/urology floor.   Subjective: Afebrile overnight, A/O x4, negative abdominal pain, negative CVA tenderness.  States has never had hematuria in the past.   Assessment & Plan: Covid vaccination; unvaccinated, requests Pfizer ordered   Principal Problem:   Hematuria Active Problems:   HTN (hypertension)  Hematuria -Evaluated by urology.  Per their recommendation CBI held. -Remove Foley in the a.m. and if patient can void will be safe for discharge -Counseled at length on discontinuing the use of Goody powders.  His wife was present and stated he will no longer be using Goody  powders. -Schedule follow-up with [facility] for 3 to 4 weeks post discharge  Abnormal Gallbladder findings-  -Mural thickening through the neck of the gallbladder. Potential non radiodense stones within the fundus of the gallbladder. Mural thickening may represent benign collapse the bladder; however, cannot exclude a mucosal lesion or neoplasm.  -Schedule follow-up with [facility] for 3 to 4 weeks post discharge -Ensure patient has follow-up prior to discharge  Essential HTN -Labetalol PRN -Chlorthalidone 25 mg daily -Losartan, 0.5 mg daily   Tobacco abuse -Nicotine patch    DVT prophylaxis: SCD Code Status: Full Family Communication: 4/3 wife present at bedside for discussion of plan of care all questions answered Status is: Inpatient    Dispo: The patient is from: Home              Anticipated d/c is to: Home              Anticipated d/c date is: 4/4              Patient currently unstable      Consultants:  [facility]    Procedures/Significant Events:    I have personally reviewed and interpreted all radiology studies and my findings are as above.  VENTILATOR SETTINGS:    Cultures   Antimicrobials:    Devices    LINES / TUBES:      Continuous Infusions: . sodium chloride 100 mL/hr at 06/21/20 0540  . [START ON 06/22/2020] cefTRIAXone (ROCEPHIN)  IV    .  sodium chloride irrigation       Objective: Vitals:   06/20/20 2108 06/20/20 2220 06/21/20 0211 06/21/20 0547  BP:  132/77 (!) 150/74 (!) 153/71  Pulse: 73 69 76 79  Resp: 14 20 20  (!) 21  Temp:  99.4 F (37.4 C) 98.6 F (37 C) 99.1 F (37.3 C)  TempSrc:  Oral Oral Oral  SpO2: 99% 100% 100% 98%  Weight:      Height:        Intake/Output Summary (Last 24 hours) at 06/21/2020 0754 Last data filed at 06/21/2020 0556 Gross per 24 hour  Intake 4149.07 ml  Output 6600 ml  Net -2450.93 ml   Filed  Weights   06/20/20 0922  Weight: 95.3 kg    Examination:  General: A/O x4, No acute respiratory distress Eyes: negative scleral hemorrhage, negative anisocoria, negative icterus ENT: Negative Runny nose, negative gingival bleeding, Neck:  Negative scars, masses, torticollis, lymphadenopathy, JVD Lungs: Clear to auscultation bilaterally without wheezes or crackles Cardiovascular: Regular rate and rhythm without murmur gallop or rub normal S1 and S2 Abdomen: negative abdominal pain, nondistended, positive soft, bowel sounds, no rebound, no ascites, no appreciable mass Extremities: No significant cyanosis, clubbing, or edema bilateral lower extremities Skin: Negative rashes, lesions, ulcers Psychiatric:  Negative depression, negative anxiety, negative fatigue, negative mania  Central nervous system:  Cranial nerves II through XII intact, tongue/uvula midline, all extremities muscle strength 5/5, sensation intact throughout, negative dysarthria, negative expressive aphasia, negative receptive aphasia.  .     Data Reviewed: Care during the described time interval was provided by me .  I have reviewed this patient's available data, including medical history, events of note, physical examination, and all test results as part of my evaluation.  CBC: Recent Labs  Lab 06/20/20 0949 06/20/20 1734 06/21/20 0012 06/21/20 0532  WBC 12.6*  --  13.2* 11.9*  NEUTROABS 10.2*  --   --   --   HGB 11.7* 11.1* 9.2* 9.1*  HCT 34.3* 32.7* 27.0* 27.6*  MCV 95.3  --  95.7 96.8  PLT 340  --  269 470   Basic Metabolic Panel: Recent Labs  Lab 06/20/20 0949 06/21/20 0532  NA 139 137  K 3.6 3.9  CL 106 106  CO2 20* 25  GLUCOSE 167* 124*  BUN 15 10  CREATININE 1.02 0.93  CALCIUM 8.8* 8.4*   GFR: Estimated Creatinine Clearance: 87.3 mL/min (by C-G formula based on SCr of 0.93 mg/dL). Liver Function Tests: No results for input(s): AST, ALT, ALKPHOS, BILITOT, PROT, ALBUMIN in the last 168  hours. No results for input(s): LIPASE, AMYLASE in the last 168 hours. No results for input(s): AMMONIA in the last 168 hours. Coagulation Profile: Recent Labs  Lab 06/20/20 0949  INR 1.0   Cardiac Enzymes: No results for input(s): CKTOTAL, CKMB, CKMBINDEX, TROPONINI in the last 168 hours. BNP (last 3 results) No results for input(s): PROBNP in the last 8760 hours. HbA1C: No results for input(s): HGBA1C in the last 72 hours. CBG: No results for input(s): GLUCAP in the last 168 hours. Lipid Profile: No results for input(s): CHOL, HDL, LDLCALC, TRIG, CHOLHDL, LDLDIRECT in the last 72 hours. Thyroid Function Tests: No results for input(s): TSH, T4TOTAL, FREET4, T3FREE, THYROIDAB in the last 72 hours. Anemia Panel: No results for input(s): VITAMINB12, FOLATE, FERRITIN, TIBC, IRON, RETICCTPCT in the last 72 hours. Sepsis Labs: No results for input(s): PROCALCITON, LATICACIDVEN in the last 168 hours.  Recent Results (from the past 240 hour(s))  SARS CORONAVIRUS 2 (TAT 6-24 HRS) Nasopharyngeal Nasopharyngeal Swab     Status: None   Collection Time: 06/20/20  5:26 PM   Specimen: Nasopharyngeal Swab  Result Value Ref Range Status   SARS Coronavirus 2 NEGATIVE NEGATIVE Final    Comment: (NOTE) SARS-CoV-2 target nucleic acids are NOT DETECTED.  The SARS-CoV-2 RNA is generally detectable in upper and lower respiratory specimens during the acute phase of infection. Negative results do not preclude SARS-CoV-2 infection, do not rule out co-infections with other pathogens, and should not be used as the sole basis for treatment or other patient management decisions. Negative results must be combined with clinical observations, patient history, and epidemiological information. The expected result is Negative.  Fact Sheet for Patients: SugarRoll.be  Fact Sheet for Healthcare Providers: https://www.-mathews.com/  This test is not yet approved  or cleared by the [place] FDA and  has been authorized for detection and/or diagnosis of SARS-CoV-2 by FDA under an Emergency Use Authorization (EUA). This EUA will remain  in effect (meaning this test can be used) for the duration of the COVID-19 declaration under Se ction 564(b)(1) of the Act, 21 U.S.C. section 360bbb-3(b)(1), unless the authorization is terminated or revoked sooner.  Performed at [facility], [place] [address] [place], [place] [address]          Radiology Studies: CT ABDOMEN PELVIS W CONTRAST  Result Date: 06/20/2020 CLINICAL DATA:  Groin pain, bladder hemorrhage. EXAM: CT ABDOMEN AND PELVIS WITH CONTRAST TECHNIQUE: Multidetector CT imaging of the abdomen and pelvis was performed using the standard protocol following bolus administration of intravenous contrast. CONTRAST:  17mL OMNIPAQUE IOHEXOL 300 MG/ML  SOLN COMPARISON:  None available FINDINGS: Lower chest: Lung bases are clear. Hepatobiliary: No focal hepatic lesion. There is thickening of the gallbladder wall through the neck of the gallbladder over a 3 cm segment (image 28/series 2) potential non radiodense 9 calculus within the lumen of the gallbladder. No evidence acute cholecystitis. Common bile duct normal caliber Pancreas: Pancreas is normal. No ductal dilatation. No pancreatic inflammation. Spleen: Normal spleen Adrenals/urinary tract: Adrenal glands normal. No enhancing renal cortical lesion. Mild bilateral hydroureter. No obstructing lesion identified. There is a Foley catheter within the lumen of the bladder. The Foley catheter is position above a markedly enlarged prostate gland. There is a large volume of high-density fluid within the lumen of the bladder consistent with hematoma/hemorrhage. The bladder is distended measuring 14.8 x 9.4 by 11.4 cm (volume = 830 cm^3). The entirety of the lumen of the distended bladder is near completely filled with blood product. Small amount a gas along the dome  of the bladder. Several small diverticula at this level. Stomach/Bowel: Stomach, small bowel, appendix, and cecum are normal. The colon and rectosigmoid colon are normal. Vascular/Lymphatic: Abdominal aorta is normal caliber with atherosclerotic calcification. There is no retroperitoneal or periportal lymphadenopathy. No pelvic lymphadenopathy. Reproductive: Enlarged prostate gland measures 7.5 by 8.0 by 5.8 cm (volume = 180 cm^3) Other: No free fluid. Musculoskeletal: Degenerative osteophytosis of the spine. IMPRESSION: 1. The lumen of the distended bladder is near completely filled with blood product. Bladder distended to a volume of 830 cubic cm. Foley catheter within lumen of the bladder position above and enlarged prostate gland. 2. Mural thickening through the neck of the gallbladder. Potential non radiodense stones within the fundus of the gallbladder. Mural thickening may represent benign collapse the bladder; however, cannot exclude a mucosal lesion or neoplasm. Recommend contrast MRI of the gallbladder on a  nonemergent basis. Electronically Signed   By: Suzy Bouchard M.D.   On: 06/20/2020 11:34        Scheduled Meds: . Chlorhexidine Gluconate Cloth  6 each Topical Daily  . nicotine  7 mg Transdermal Daily   Continuous Infusions: . sodium chloride 100 mL/hr at 06/21/20 0540  . [START ON 06/22/2020] cefTRIAXone (ROCEPHIN)  IV    . sodium chloride irrigation       LOS: 1 day    Time spent:40 min    Shequilla Goodgame, Geraldo Docker, MD [facility]   If 7PM-7AM, please contact night-coverage 06/21/2020, 7:54 AM

## 2020-06-22 DIAGNOSIS — R030 Elevated blood-pressure reading, without diagnosis of hypertension: Secondary | ICD-10-CM

## 2020-06-22 DIAGNOSIS — I10 Essential (primary) hypertension: Secondary | ICD-10-CM

## 2020-06-22 DIAGNOSIS — R310 Gross hematuria: Principal | ICD-10-CM

## 2020-06-22 LAB — CBC
HCT: 26.2 % — ABNORMAL LOW (ref 39.0–52.0)
HCT: 27.3 % — ABNORMAL LOW (ref 39.0–52.0)
Hemoglobin: 8.7 g/dL — ABNORMAL LOW (ref 13.0–17.0)
Hemoglobin: 9.2 g/dL — ABNORMAL LOW (ref 13.0–17.0)
MCH: 31.9 pg (ref 26.0–34.0)
MCH: 32.3 pg (ref 26.0–34.0)
MCHC: 33.2 g/dL (ref 30.0–36.0)
MCHC: 33.7 g/dL (ref 30.0–36.0)
MCV: 96 fL (ref 80.0–100.0)
MCV: 95.8 fL (ref 80.0–100.0)
Platelets: 248 10*3/uL (ref 150–400)
Platelets: 264 10*3/uL (ref 150–400)
RBC: 2.73 MIL/uL — ABNORMAL LOW (ref 4.22–5.81)
RBC: 2.85 MIL/uL — ABNORMAL LOW (ref 4.22–5.81)
RDW: 13.9 % (ref 11.5–15.5)
RDW: 13.6 % (ref 11.5–15.5)
WBC: 13.5 10*3/uL — ABNORMAL HIGH (ref 4.0–10.5)
WBC: 14.3 10*3/uL — ABNORMAL HIGH (ref 4.0–10.5)
nRBC: 0 % (ref 0.0–0.2)
nRBC: 0 % (ref 0.0–0.2)

## 2020-06-22 LAB — URINE CULTURE: Culture: NO GROWTH

## 2020-06-22 LAB — COMPREHENSIVE METABOLIC PANEL
ALT: 8 U/L (ref 0–44)
AST: 15 U/L (ref 15–41)
Albumin: 2.9 g/dL — ABNORMAL LOW (ref 3.5–5.0)
Alkaline Phosphatase: 41 U/L (ref 38–126)
Anion gap: 5 (ref 5–15)
BUN: 8 mg/dL (ref 8–23)
CO2: 26 mmol/L (ref 22–32)
Calcium: 8.6 mg/dL — ABNORMAL LOW (ref 8.9–10.3)
Chloride: 106 mmol/L (ref 98–111)
Creatinine, Ser: 0.92 mg/dL (ref 0.61–1.24)
GFR, Estimated: >60 mL/min (ref >60)
Glucose, Bld: 144 mg/dL — ABNORMAL HIGH (ref 70–99)
Potassium: 3.6 mmol/L (ref 3.5–5.1)
Sodium: 137 mmol/L (ref 135–145)
Total Bilirubin: 0.4 mg/dL (ref 0.3–1.2)
Total Protein: 5.9 g/dL — ABNORMAL LOW (ref 6.5–8.1)

## 2020-06-22 LAB — PHOSPHORUS: Phosphorus: 2.2 mg/dL — ABNORMAL LOW (ref 2.5–4.6)

## 2020-06-22 LAB — MAGNESIUM: Magnesium: 1.9 mg/dL (ref 1.7–2.4)

## 2020-06-22 MED ORDER — SULFAMETHOXAZOLE-TRIMETHOPRIM 800-160 MG PO TABS: 1.0000 | ORAL_TABLET | Freq: Two times a day (BID) | ORAL | 0 refills | Status: DC

## 2020-06-22 MED ORDER — POLYETHYLENE GLYCOL 3350 17 G PO PACK: 17.0000 g | PACK | Freq: Every day | ORAL | 0 refills | Status: DC | PRN

## 2020-06-22 NOTE — Plan of Care (Signed)

## 2020-06-22 NOTE — Discharge Summary (Signed)
Physician Discharge Summary  Eddie Mendoza PXT:[id number] DOB: 1951-02-02 DOA: 06/20/2020  PCP: [facility]  Admit date: 06/20/2020 Discharge date: 06/22/2020  Admitted From: Home  Discharge disposition: home   Recommendations for Outpatient Follow-Up:   . Follow up with your primary care provider in one week.  . Check CBC, BMP, magnesium in the next visit . Follow-up with urology Dr. Diona Fanti for possible cystoscopy, CTs urogram as outpatient. . Thickening of the gallbladder noted on the CT scan.  This will need to be followed up as outpatient.   Discharge Diagnosis:   Principal Problem:   Hematuria Active Problems:   HTN (hypertension)   Discharge Condition: Improved.  Diet recommendation: Low sodium, heart healthy.    Wound care: None.  Code status: Full.   History of Present Illness:   Eddie R Turneris a 70 y.o.male with past medical history of hypertension tobacco use use presented to hospital with 2 to 3 days of blood in urine with significant lower abdominal pain and inability to urinate.  In the ED CT scan of the abdomen and pelvis was done which showed with contrast-distended bladder nearly completely filled with blood products, volume 830 cubic cc. Enlarged prostate. Foley catheter was placed and patient underwent continuous bladder irrigation.  Urology was consulted and patient was admitted to the hospital.  Hospital Course:   Following conditions were addressed during hospitalization as listed below,  Hematuria With passage of large blood clots.  Patient was seen by urology.  Was initially on continuous bladder irrigation.  Patient was counseled against partner.  Patient will follow up with [facility] after discharge.  Abnormal Gallbladder findings- Thickening of the gallbladder.  No stones noted.  Will need outpatient follow-up after discharge   Essential HTN Patient will resume chlorthalidone and losartan  as outpatient.  Tobacco abuse Encouraged cessation.  Disposition.  At this time, patient is stable for disposition home with outpatient PCP and urology follow-up  Medical Consultants:    Urology  Procedures:    Foley catheter insertion with CBI Subjective:   Today, patient was seen and examined at bedside.  Denies any nausea vomiting fever chills or rigor.  Was able to urinate after removal of Foley catheter.  Discharge Exam:   Vitals:   06/22/20 0609 06/22/20 0847  BP: (!) 164/68 (!) 168/75  Pulse: 86 79  Resp: 18 18  Temp: 99 F (37.2 C)   SpO2: 97%    Vitals:   06/21/20 1435 06/21/20 2047 06/22/20 0609 06/22/20 0847  BP: (!) 150/67 (!) 164/86 (!) 164/68 (!) 168/75  Pulse: 77 85 86 79  Resp: 18 18 18 18   Temp: 98.9 F (37.2 C) 99.4 F (37.4 C) 99 F (37.2 C)   TempSrc: Oral Oral Oral   SpO2: 99% 98% 97%   Weight:      Height:        General: Alert awake, not in obvious distress HENT: pupils equally reacting to light,  No scleral pallor or icterus noted. Oral mucosa is moist.  Chest:  Clear breath sounds.  Diminished breath sounds bilaterally. No crackles or wheezes.  CVS: S1 &S2 heard. No murmur.  Regular rate and rhythm. Abdomen: Soft, nontender, nondistended.  Bowel sounds are heard.   Extremities: No cyanosis, clubbing or edema.  Peripheral pulses are palpable. Psych: Alert, awake and oriented, normal mood CNS:  No cranial nerve deficits.  Power equal in all extremities.   Skin: Warm and dry.  No rashes noted.  The results of significant diagnostics from this hospitalization (including imaging, microbiology, ancillary and laboratory) are listed below for reference.     Diagnostic Studies:   CT ABDOMEN PELVIS W CONTRAST  Result Date: 06/20/2020 CLINICAL DATA:  Groin pain, bladder hemorrhage. EXAM: CT ABDOMEN AND PELVIS WITH CONTRAST TECHNIQUE: Multidetector CT imaging of the abdomen and pelvis was performed using the standard protocol following bolus  administration of intravenous contrast. CONTRAST:  152mL OMNIPAQUE IOHEXOL 300 MG/ML  SOLN COMPARISON:  None available FINDINGS: Lower chest: Lung bases are clear. Hepatobiliary: No focal hepatic lesion. There is thickening of the gallbladder wall through the neck of the gallbladder over a 3 cm segment (image 28/series 2) potential non radiodense 9 calculus within the lumen of the gallbladder. No evidence acute cholecystitis. Common bile duct normal caliber Pancreas: Pancreas is normal. No ductal dilatation. No pancreatic inflammation. Spleen: Normal spleen Adrenals/urinary tract: Adrenal glands normal. No enhancing renal cortical lesion. Mild bilateral hydroureter. No obstructing lesion identified. There is a Foley catheter within the lumen of the bladder. The Foley catheter is position above a markedly enlarged prostate gland. There is a large volume of high-density fluid within the lumen of the bladder consistent with hematoma/hemorrhage. The bladder is distended measuring 14.8 x 9.4 by 11.4 cm (volume = 830 cm^3). The entirety of the lumen of the distended bladder is near completely filled with blood product. Small amount a gas along the dome of the bladder. Several small diverticula at this level. Stomach/Bowel: Stomach, small bowel, appendix, and cecum are normal. The colon and rectosigmoid colon are normal. Vascular/Lymphatic: Abdominal aorta is normal caliber with atherosclerotic calcification. There is no retroperitoneal or periportal lymphadenopathy. No pelvic lymphadenopathy. Reproductive: Enlarged prostate gland measures 7.5 by 8.0 by 5.8 cm (volume = 180 cm^3) Other: No free fluid. Musculoskeletal: Degenerative osteophytosis of the spine. IMPRESSION: 1. The lumen of the distended bladder is near completely filled with blood product. Bladder distended to a volume of 830 cubic cm. Foley catheter within lumen of the bladder position above and enlarged prostate gland. 2. Mural thickening through the neck of  the gallbladder. Potential non radiodense stones within the fundus of the gallbladder. Mural thickening may represent benign collapse the bladder; however, cannot exclude a mucosal lesion or neoplasm. Recommend contrast MRI of the gallbladder on a nonemergent basis. Electronically Signed   By: Suzy Bouchard M.D.   On: 06/20/2020 11:34     Labs:   Basic Metabolic Panel: Recent Labs  Lab 06/20/20 0949 06/21/20 0532 06/22/20 0040  NA 139 137 137  K 3.6 3.9 3.6  CL 106 106 106  CO2 20* 25 26  GLUCOSE 167* 124* 144*  BUN 15 10 8   CREATININE 1.02 0.93 0.92  CALCIUM 8.8* 8.4* 8.6*  MG  --   --  1.9  PHOS  --   --  2.2*   GFR Estimated Creatinine Clearance: 88.2 mL/min (by C-G formula based on SCr of 0.92 mg/dL). Liver Function Tests: Recent Labs  Lab 06/22/20 0040  AST 15  ALT 8  ALKPHOS 41  BILITOT 0.4  PROT 5.9*  ALBUMIN 2.9*   No results for input(s): LIPASE, AMYLASE in the last 168 hours. No results for input(s): AMMONIA in the last 168 hours. Coagulation profile Recent Labs  Lab 06/20/20 0949  INR 1.0    CBC: Recent Labs  Lab 06/20/20 0949 06/20/20 1734 06/21/20 0532 06/21/20 1120 06/21/20 1826 06/22/20 0040 06/22/20 0647  WBC 12.6*   < > 11.9* 12.0* 14.2* 13.5*  14.3*  NEUTROABS 10.2*  --   --   --   --   --   --   HGB 11.7*   < > 9.1* 8.8* 9.2* 8.7* 9.2*  HCT 34.3*   < > 27.6* 26.5* 28.3* 26.2* 27.3*  MCV 95.3   < > 96.8 97.4 98.3 96.0 95.8  PLT 340   < > 270 256 276 248 264   < > = values in this interval not displayed.   Cardiac Enzymes: No results for input(s): CKTOTAL, CKMB, CKMBINDEX, TROPONINI in the last 168 hours. BNP: Invalid input(s): POCBNP CBG: No results for input(s): GLUCAP in the last 168 hours. D-Dimer No results for input(s): DDIMER in the last 72 hours. Hgb A1c No results for input(s): HGBA1C in the last 72 hours. Lipid Profile No results for input(s): CHOL, HDL, LDLCALC, TRIG, CHOLHDL, LDLDIRECT in the last 72  hours. Thyroid function studies No results for input(s): TSH, T4TOTAL, T3FREE, THYROIDAB in the last 72 hours.  Invalid input(s): FREET3 Anemia work up No results for input(s): VITAMINB12, FOLATE, FERRITIN, TIBC, IRON, RETICCTPCT in the last 72 hours. Microbiology Recent Results (from the past 240 hour(s))  SARS CORONAVIRUS 2 (TAT 6-24 HRS) Nasopharyngeal Nasopharyngeal Swab     Status: None   Collection Time: 06/20/20  5:26 PM   Specimen: Nasopharyngeal Swab  Result Value Ref Range Status   SARS Coronavirus 2 NEGATIVE NEGATIVE Final    Comment: (NOTE) SARS-CoV-2 target nucleic acids are NOT DETECTED.  The SARS-CoV-2 RNA is generally detectable in upper and lower respiratory specimens during the acute phase of infection. Negative results do not preclude SARS-CoV-2 infection, do not rule out co-infections with other pathogens, and should not be used as the sole basis for treatment or other patient management decisions. Negative results must be combined with clinical observations, patient history, and epidemiological information. The expected result is Negative.  Fact Sheet for Patients: SugarRoll.be  Fact Sheet for Healthcare Providers: https://www.woods-mathews.com/  This test is not yet approved or cleared by the [place] FDA and  has been authorized for detection and/or diagnosis of SARS-CoV-2 by FDA under an Emergency Use Authorization (EUA). This EUA will remain  in effect (meaning this test can be used) for the duration of the COVID-19 declaration under Se ction 564(b)(1) of the Act, 21 U.S.C. section 360bbb-3(b)(1), unless the authorization is terminated or revoked sooner.  Performed at [facility], [place] [address], [place], [place] [address]   Urine Culture     Status: None   Collection Time: 06/20/20  8:53 PM   Specimen: Urine, Catheterized  Result Value Ref Range Status   Specimen Description   Final    URINE,  CATHETERIZED Performed at [facility], [address] [place], [place] [address]    Special Requests   Final    NONE Performed at [facility], [address], [place], [place] [address]    Culture   Final    NO GROWTH Performed at [facility], [place] [address], [place], [place] [address]    Report Status 06/22/2020 FINAL  Final     Discharge Instructions:   Discharge Instructions    Call MD for:  persistant nausea and vomiting   Complete by: As directed    Call MD for:  severe uncontrolled pain   Complete by: As directed    Call MD for:  temperature >100.4   Complete by: As directed    Diet general   Complete by: As  directed    Discharge instructions   Complete by: As directed    Follow-up with your primary care physician in 1 week.  Check blood work at that time.  Follow-up with Dr. Diona Fanti urology in the clinic discuss about cystoscopy as outpatient..  Seek medical attention if you cannot pass urine.  Please do not take Goody powders.   Increase activity slowly   Complete by: As directed      Allergies as of 06/22/2020   No Known Allergies     Medication List    TAKE these medications   chlorthalidone 25 MG tablet Commonly known as: HYGROTON Take 25 mg by mouth daily.   colchicine 0.6 MG tablet Take 0.6mg  (one tablet) by mouth every 1-2 hours until one of the following occurs: 1.  The pain is gone 2.  The maximum dose has been given ( no more than 3 tabs in 3 hours or 10 tabs in 24 hours) 3.  The side effects outweight the benefits   lidocaine 5 % ointment Commonly known as: XYLOCAINE APPLY SMALL AMOUNT TO AFFECTED AREA FOUR TIMES A DAY AS NEEDED   losartan 25 MG tablet Commonly known as: COZAAR Take 12.5 mg by mouth daily.   nicotine polacrilex 2 MG lozenge Commonly known as: COMMIT DISSOLVE 1 LOZENGE IN MOUTH EVERY FOUR HOURS AS NEEDED   polyethylene glycol 17 g packet Commonly known as: MIRALAX / GLYCOLAX Take 17 g by mouth daily as needed  for mild constipation.   sulfamethoxazole-trimethoprim 800-160 MG tablet Commonly known as: BACTRIM DS Take 1 tablet by mouth 2 (two) times daily.       Follow-up Information    Franchot Gallo, MD Follow up.   Specialty: Urology Why: We will call you to set up a follow-up appointment Contact information: [address] [place] [address] [phone]        [facility] [place]. Schedule an appointment as soon as possible for a visit in 1 week(s).   Contact information: [place] [address] [phone]                Time coordinating discharge: 39 minutes  Signed:  Katelinn Justice  [facility] 06/22/2020, 11:57 AM

## 2020-06-22 NOTE — Progress Notes (Addendum)
RN reviewed discharge paperwork with patient and wife. All questions addressed. Tele and IV removed. Pt has urinated x2 without issue. No further needs at this time. Pt left via private vehicle in stable condition with wife.

## 2020-06-22 NOTE — Discharge Instructions (Signed)
1. You may see some blood in the urine and may have some burning with urination for 48-72 hours. You also may notice that you have to urinate more frequently or urgently after your procedure which is normal.  °2. You should call should you develop an inability urinate, fever > 101, persistent nausea and vomiting that prevents you from eating or drinking to stay hydrated.  °

## 2020-06-28 ENCOUNTER — Inpatient Hospital Stay (HOSPITAL_COMMUNITY)
Admission: EM | Admit: 2020-06-28 | Discharge: 2020-07-01 | DRG: 725 | Disposition: A | Discharge status: Home / Self Care | Payer: No Typology Code available for payment source | Attending: Family Medicine | Admitting: Family Medicine

## 2020-06-28 ENCOUNTER — Other Ambulatory Visit: Payer: Self-pay

## 2020-06-28 ENCOUNTER — Encounter (HOSPITAL_COMMUNITY): Payer: Self-pay

## 2020-06-28 DIAGNOSIS — N179 Acute kidney failure, unspecified: Secondary | ICD-10-CM | POA: Diagnosis present

## 2020-06-28 DIAGNOSIS — R310 Gross hematuria: Secondary | ICD-10-CM | POA: Diagnosis present

## 2020-06-28 DIAGNOSIS — F17210 Nicotine dependence, cigarettes, uncomplicated: Secondary | ICD-10-CM | POA: Diagnosis present

## 2020-06-28 DIAGNOSIS — R319 Hematuria, unspecified: Secondary | ICD-10-CM | POA: Diagnosis not present

## 2020-06-28 DIAGNOSIS — I10 Essential (primary) hypertension: Secondary | ICD-10-CM | POA: Diagnosis not present

## 2020-06-28 DIAGNOSIS — R7989 Other specified abnormal findings of blood chemistry: Secondary | ICD-10-CM

## 2020-06-28 DIAGNOSIS — U071 COVID-19: Secondary | ICD-10-CM | POA: Diagnosis present

## 2020-06-28 DIAGNOSIS — Z79899 Other long term (current) drug therapy: Secondary | ICD-10-CM

## 2020-06-28 DIAGNOSIS — D62 Acute posthemorrhagic anemia: Secondary | ICD-10-CM | POA: Diagnosis present

## 2020-06-28 DIAGNOSIS — D649 Anemia, unspecified: Secondary | ICD-10-CM

## 2020-06-28 DIAGNOSIS — N400 Enlarged prostate without lower urinary tract symptoms: Secondary | ICD-10-CM | POA: Diagnosis present

## 2020-06-28 DIAGNOSIS — N3289 Other specified disorders of bladder: Secondary | ICD-10-CM | POA: Diagnosis present

## 2020-06-28 LAB — CBC WITH DIFFERENTIAL/PLATELET
Abs Immature Granulocytes: 0.08 10*3/uL — ABNORMAL HIGH (ref 0.00–0.07)
Basophils Absolute: 0.1 10*3/uL (ref 0.0–0.1)
Basophils Relative: 1 %
Eosinophils Absolute: 0.2 10*3/uL (ref 0.0–0.5)
Eosinophils Relative: 2 %
HCT: 21.9 % — ABNORMAL LOW (ref 39.0–52.0)
Hemoglobin: 7.4 g/dL — ABNORMAL LOW (ref 13.0–17.0)
Immature Granulocytes: 1 %
Lymphocytes Relative: 17 %
Lymphs Abs: 1.4 10*3/uL (ref 0.7–4.0)
MCH: 32.3 pg (ref 26.0–34.0)
MCHC: 33.8 g/dL (ref 30.0–36.0)
MCV: 95.6 fL (ref 80.0–100.0)
Monocytes Absolute: 0.6 10*3/uL (ref 0.1–1.0)
Monocytes Relative: 7 %
Neutro Abs: 5.7 10*3/uL (ref 1.7–7.7)
Neutrophils Relative %: 72 %
Platelets: 405 10*3/uL — ABNORMAL HIGH (ref 150–400)
RBC: 2.29 MIL/uL — ABNORMAL LOW (ref 4.22–5.81)
RDW: 13.2 % (ref 11.5–15.5)
WBC: 8 10*3/uL (ref 4.0–10.5)
nRBC: 0 % (ref 0.0–0.2)

## 2020-06-28 LAB — URINALYSIS, ROUTINE W REFLEX MICROSCOPIC

## 2020-06-28 LAB — ABO/RH: ABO/RH(D): A POS

## 2020-06-28 LAB — URINALYSIS, MICROSCOPIC (REFLEX)
RBC / HPF: >50 RBC/hpf (ref 0–5)
Squamous Epithelial / HPF: NONE SEEN (ref 0–5)

## 2020-06-28 LAB — RESP PANEL BY RT-PCR (FLU A&B, COVID) ARPGX2
Influenza A by PCR: NEGATIVE
Influenza B by PCR: NEGATIVE
SARS Coronavirus 2 by RT PCR: POSITIVE — AB

## 2020-06-28 LAB — COMPREHENSIVE METABOLIC PANEL
ALT: 16 U/L (ref 0–44)
AST: 15 U/L (ref 15–41)
Albumin: 3.1 g/dL — ABNORMAL LOW (ref 3.5–5.0)
Alkaline Phosphatase: 59 U/L (ref 38–126)
Anion gap: 8 (ref 5–15)
BUN: 16 mg/dL (ref 8–23)
CO2: 24 mmol/L (ref 22–32)
Calcium: 8.8 mg/dL — ABNORMAL LOW (ref 8.9–10.3)
Chloride: 107 mmol/L (ref 98–111)
Creatinine, Ser: 1.25 mg/dL — ABNORMAL HIGH (ref 0.61–1.24)
GFR, Estimated: >60 mL/min (ref >60)
Glucose, Bld: 173 mg/dL — ABNORMAL HIGH (ref 70–99)
Potassium: 3.7 mmol/L (ref 3.5–5.1)
Sodium: 139 mmol/L (ref 135–145)
Total Bilirubin: 0.4 mg/dL (ref 0.3–1.2)
Total Protein: 6.4 g/dL — ABNORMAL LOW (ref 6.5–8.1)

## 2020-06-28 LAB — HEMOGLOBIN AND HEMATOCRIT, BLOOD
HCT: 24.8 % — ABNORMAL LOW (ref 39.0–52.0)
Hemoglobin: 8 g/dL — ABNORMAL LOW (ref 13.0–17.0)

## 2020-06-28 LAB — PREPARE RBC (CROSSMATCH)

## 2020-06-28 LAB — PROTIME-INR
INR: 1 (ref 0.8–1.2)
Prothrombin Time: 12.8 seconds (ref 11.4–15.2)

## 2020-06-28 MED ORDER — SODIUM CHLORIDE 0.9 % IV SOLN
10.0000 mL/h | Freq: Once | INTRAVENOUS | Status: AC
  Administered 2020-06-28: 10 mL/h via INTRAVENOUS

## 2020-06-28 MED ORDER — MORPHINE SULFATE (PF) 2 MG/ML IV SOLN
2.0000 mg | INTRAVENOUS | Status: DC | PRN
  Administered 2020-06-28 – 2020-06-30 (×4): 2 mg via INTRAVENOUS
  Filled 2020-06-28 (×4): qty 1

## 2020-06-28 MED ORDER — ACETAMINOPHEN 650 MG RE SUPP: 650.0000 mg | Freq: Four times a day (QID) | RECTAL | Status: DC | PRN

## 2020-06-28 MED ORDER — ONDANSETRON HCL 4 MG/2ML IJ SOLN
4.0000 mg | Freq: Once | INTRAMUSCULAR | Status: AC
  Administered 2020-06-28: 4 mg via INTRAVENOUS
  Filled 2020-06-28: qty 2

## 2020-06-28 MED ORDER — LIDOCAINE HCL URETHRAL/MUCOSAL 2 % EX GEL
1.0000 "application " | Freq: Once | CUTANEOUS | Status: AC
  Administered 2020-06-28: 1 via URETHRAL
  Filled 2020-06-28: qty 11

## 2020-06-28 MED ORDER — HYDRALAZINE HCL 20 MG/ML IJ SOLN: 5.0000 mg | Freq: Three times a day (TID) | INTRAMUSCULAR | Status: DC | PRN

## 2020-06-28 MED ORDER — POLYETHYLENE GLYCOL 3350 17 G PO PACK: 17.0000 g | PACK | Freq: Every day | ORAL | Status: DC | PRN

## 2020-06-28 MED ORDER — BELLADONNA ALKALOIDS-OPIUM 16.2-60 MG RE SUPP
1.0000 | Freq: Three times a day (TID) | RECTAL | Status: DC | PRN
  Administered 2020-06-29: 1 via RECTAL
  Filled 2020-06-28 (×2): qty 1

## 2020-06-28 MED ORDER — SODIUM CHLORIDE 0.9 % IR SOLN
3000.0000 mL | Status: DC
  Administered 2020-06-28 – 2020-06-30 (×15): 3000 mL

## 2020-06-28 MED ORDER — MORPHINE SULFATE (PF) 4 MG/ML IV SOLN
4.0000 mg | Freq: Once | INTRAVENOUS | Status: AC
Start: 2020-06-28 — End: 2020-06-28
  Administered 2020-06-28: 4 mg via INTRAVENOUS
  Filled 2020-06-28: qty 1

## 2020-06-28 MED ORDER — ACETAMINOPHEN 325 MG PO TABS: 650.0000 mg | ORAL_TABLET | Freq: Four times a day (QID) | ORAL | Status: DC | PRN

## 2020-06-28 NOTE — Consult Note (Signed)
I have been asked to see the patient by Dr. Varney Biles, for evaluation and management of gross hematuria.  History of present illness: 70 year old man with recent hospital admission from 06/21/2020-06/22/2020 for gross hematuria presents to the ED again with acute onset gross hematuria and urinary retention.  During last hospital admission Foley catheter was placed with need for irrigation of clot and initiation of continuous bladder irrigation.  He was then discharged with outpatient follow-up to proceed with gross hematuria work-up.  Urology was consulted for management of gross hematuria.  Patient just received morphine and was Foley catheter was placed when I saw him.  He also had a hemoglobin drop from 9.2 to 7.4.   Review of systems: A 12 point comprehensive review of systems was obtained and is negative unless otherwise stated in the history of present illness.  Patient Active Problem List   Diagnosis Date Noted  . Hematuria 06/20/2020  . HTN (hypertension) 06/20/2020    No current facility-administered medications on file prior to encounter.   Current Outpatient Medications on File Prior to Encounter  Medication Sig Dispense Refill  . chlorthalidone (HYGROTON) 25 MG tablet Take 25 mg by mouth daily.    . colchicine 0.6 MG tablet Take 0.6mg  (one tablet) by mouth every 1-2 hours until one of the following occurs: 1.  The pain is gone 2.  The maximum dose has been given ( no more than 3 tabs in 3 hours or 10 tabs in 24 hours) 3.  The side effects outweight the benefits 13 tablet 0  . lidocaine (XYLOCAINE) 5 % ointment APPLY SMALL AMOUNT TO AFFECTED AREA FOUR TIMES A DAY AS NEEDED    . losartan (COZAAR) 25 MG tablet Take 12.5 mg by mouth daily.    . nicotine polacrilex (COMMIT) 2 MG lozenge DISSOLVE 1 LOZENGE IN MOUTH EVERY FOUR HOURS AS NEEDED    . polyethylene glycol (MIRALAX / GLYCOLAX) 17 g packet Take 17 g by mouth daily as needed for mild constipation. 14 each 0  .  sulfamethoxazole-trimethoprim (BACTRIM DS) 800-160 MG tablet Take 1 tablet by mouth 2 (two) times daily. 6 tablet 0    Past Medical History:  Diagnosis Date  . Hypertension     History reviewed. No pertinent surgical history.  Social History   Tobacco Use  . Smoking status: Current Every Day Smoker    Packs/day: 0.50    Years: 30.00    Pack years: 15.00    Types: Cigarettes  . Smokeless tobacco: Never Used  Substance Use Topics  . Alcohol use: Yes    Alcohol/week: 3.0 standard drinks    Types: 3 Shots of liquor per week  . Drug use: No    History reviewed. No pertinent family history.  PE: Vitals:   06/28/20 1200 06/28/20 1215 06/28/20 1230 06/28/20 1245  BP: (!) 143/62  (!) 150/67   Pulse: 76 71 66 73  Resp: 20 17 18 20   Temp:      TempSrc:      SpO2: 100% 100% 100% 100%   Patient appears to be in no acute distress  patient is alert and oriented x3 Atraumatic normocephalic head No increased work of breathing, no audible wheezes/rhonchi Regular sinus rhythm/rate Abdomen is soft, nontender, nondistended Lower extremities are symmetric without appreciable edema Grossly neurologically intact No identifiable skin lesions  Recent Labs    06/28/20 1105  WBC 8.0  HGB 7.4*  HCT 21.9*   Recent Labs    06/28/20 1105  NA  139  K 3.7  CL 107  CO2 24  GLUCOSE 173*  BUN 16  CREATININE 1.25*  CALCIUM 8.8*   Recent Labs    06/28/20 1217  INR 1.0   No results for input(s): LABURIN in the last 72 hours. Results for orders placed or performed during the hospital encounter of 06/20/20  SARS CORONAVIRUS 2 (TAT 6-24 HRS) Nasopharyngeal Nasopharyngeal Swab     Status: None   Collection Time: 06/20/20  5:26 PM   Specimen: Nasopharyngeal Swab  Result Value Ref Range Status   SARS Coronavirus 2 NEGATIVE NEGATIVE Final    Comment: (NOTE) SARS-CoV-2 target nucleic acids are NOT DETECTED.  The SARS-CoV-2 RNA is generally detectable in upper and lower respiratory  specimens during the acute phase of infection. Negative results do not preclude SARS-CoV-2 infection, do not rule out co-infections with other pathogens, and should not be used as the sole basis for treatment or other patient management decisions. Negative results must be combined with clinical observations, patient history, and epidemiological information. The expected result is Negative.  Fact Sheet for Patients: SugarRoll.be  Fact Sheet for Healthcare Providers: https://www.woods-mathews.com/  This test is not yet approved or cleared by the [place] FDA and  has been authorized for detection and/or diagnosis of SARS-CoV-2 by FDA under an Emergency Use Authorization (EUA). This EUA will remain  in effect (meaning this test can be used) for the duration of the COVID-19 declaration under Se ction 564(b)(1) of the Act, 21 U.S.C. section 360bbb-3(b)(1), unless the authorization is terminated or revoked sooner.  Performed at [facility], [place] [address] [place], [place] [address]   Urine Culture     Status: None   Collection Time: 06/20/20  8:53 PM   Specimen: Urine, Catheterized  Result Value Ref Range Status   Specimen Description   Final    URINE, CATHETERIZED Performed at [facility], [address], [place], [place] [address]    Special Requests   Final    NONE Performed at [facility], [address], [place], [place] [address]    Culture   Final    NO GROWTH Performed at [facility], [place] [address] [place], [place] [address]    Report Status 06/22/2020 FINAL  Final    Imaging: CT Abd/Pelvis 06/20/20: IMPRESSION: 1. The lumen of the distended bladder is near completely filled with blood product. Bladder distended to a volume of 830 cubic cm. Foley catheter within lumen of the bladder position above and enlarged prostate gland. 2. Mural thickening through the neck of the gallbladder. Potential non  radiodense stones within the fundus of the gallbladder. Mural thickening may represent benign collapse the bladder; however, cannot exclude a mucosal lesion or neoplasm. Recommend contrast MRI of the gallbladder on a nonemergent basis.   Electronically Signed   By: Suzy Bouchard M.D.   On: 06/20/2020 11:34   Imp/Recommendations: 70 year old man with ongoing gross hematuria and acute blood loss anemia from unknown source.  -1 French three-way Foley catheter was placed by the emergency room nurse; urology then irrigated 1 L of clot out and continuous bladder irrigation was initiated -Recommend admission to medicine and treatment for acute blood loss anemia with blood transfusion at their discretion -Patient will need CT urogram to evaluate upper GU tract as well as cystoscopy to evaluate lower tract -Typically cystoscopy is done as an outpatient after bleeding has subsided however given recent hospital admission for same diagnosis will have low threshold for proceeding  with cystoscopy on sooner side  Thank you for involving me in this patient's care.  Please page with any further questions or concerns. Tayvion Lauder D Dee Paden

## 2020-06-28 NOTE — ED Triage Notes (Signed)
Emergency Medicine Provider Triage Evaluation Note  Eddie Mendoza , a 70 y.o. male  was evaluated in triage.  Pt complains of urinary retention x 1 day. Holey catheter removed x 6 days ago per patient.Has history of the same, recently admitted for the same. Supposed to follow up with urology outpatient next week.  Review of Systems  Positive: Urinary retention, dysuria, gross hematuria Negative: Abdominal pain, nausea, emesis  Physical Exam  BP 133/76 (BP Location: Left Arm)   Pulse 98   Temp 98.9 F (37.2 C) (Oral)   Resp 18   SpO2 100%  Gen:   Awake, no distress   HEENT:  Atraumatic  Resp:  Normal effort  Cardiac:  Normal rate  Abd:   Mildly distended, nontender  MSK:   Moves extremities without difficulty  Neuro:  Speech clear   Medical Decision Making  Medically screening exam initiated at 10:27 AM.  Appropriate orders placed.  Eddie Mendoza was informed that the remainder of the evaluation will be completed by another provider, this initial triage assessment does not replace that evaluation, and the importance of remaining in the ED until their evaluation is complete.  Clinical Impression  Needs bladder scan, UA, labs for urinary retention    Eddie Folk, PA-C 06/28/20 1031

## 2020-06-28 NOTE — ED Triage Notes (Signed)
Pt arrived via walk in, c/o dysuria frequency, urgency and blood in urine. States he was admitted and discharged, feeling better then started feeling worse again.

## 2020-06-28 NOTE — Plan of Care (Signed)
  Problem: Education: Goal: Knowledge of General Education information will improve Description: Including pain rating scale, medication(s)/side effects and non-pharmacologic comfort measures Outcome: Progressing   Problem: Clinical Measurements: Goal: Ability to maintain clinical measurements within normal limits will improve Outcome: Progressing   Problem: Clinical Measurements: Goal: Diagnostic test results will improve Outcome: Progressing   Problem: Elimination: Goal: Will not experience complications related to urinary retention Outcome: Progressing

## 2020-06-28 NOTE — ED Provider Notes (Signed)
[facility] DEPT Provider Note   CSN: [id number] Arrival date & time: 06/28/20  1001    History Chief Complaint  Patient presents with  . Dysuria    Eddie Mendoza is a 70 y.o. male with past medical history significant for hypertension, recent admission for gross hematuria.  Has not been able to follow-up with urology outpatient.  Had catheter removed 6 days ago.  He completed his antibiotics.  He feels like he is able to empty his bladder however has gross hematuria and clots. This began yesterday evening.  States he tried to reach out to urology however was not able to reach them.  He denies any lightheadedness or dizziness.  He does admit to some generalized fatigue.  Has some very mild suprapubic discomfort.  Some burning with urination.  Denies fever, chills, nausea, vomiting, chest pain, shortness of breath, diarrhea.  Denies additional aggravating or alleviating factors.  History obtained from patient and past medical records.  No interpreter used  HPI     Past Medical History:  Diagnosis Date  . Hypertension     Patient Active Problem List   Diagnosis Date Noted  . Hematuria 06/20/2020  . HTN (hypertension) 06/20/2020    History reviewed. No pertinent surgical history.     History reviewed. No pertinent family history.  Social History   Tobacco Use  . Smoking status: Current Every Day Smoker    Packs/day: 0.50    Years: 30.00    Pack years: 15.00    Types: Cigarettes  . Smokeless tobacco: Never Used  Substance Use Topics  . Alcohol use: Yes    Alcohol/week: 3.0 standard drinks    Types: 3 Shots of liquor per week  . Drug use: No    Home Medications Prior to Admission medications   Medication Sig Start Date End Date Taking? Authorizing Provider  chlorthalidone (HYGROTON) 25 MG tablet Take 25 mg by mouth daily.    [provider]  colchicine 0.6 MG tablet Take 0.6mg  (one tablet) by mouth every 1-2 hours until  one of the following occurs: 1.  The pain is gone 2.  The maximum dose has been given ( no more than 3 tabs in 3 hours or 10 tabs in 24 hours) 3.  The side effects outweight the benefits 09/25/15   Pisciotta, Elmyra Ricks, PA-C  lidocaine (XYLOCAINE) 5 % ointment APPLY SMALL AMOUNT TO AFFECTED AREA FOUR TIMES A DAY AS NEEDED 04/15/20   [provider]  losartan (COZAAR) 25 MG tablet Take 12.5 mg by mouth daily.    [provider]  nicotine polacrilex (COMMIT) 2 MG lozenge DISSOLVE 1 LOZENGE IN MOUTH EVERY FOUR HOURS AS NEEDED 01/29/20   [provider]  polyethylene glycol (MIRALAX / GLYCOLAX) 17 g packet Take 17 g by mouth daily as needed for mild constipation. 06/22/20   Pokhrel, Corrie Mckusick, MD  sulfamethoxazole-trimethoprim (BACTRIM DS) 800-160 MG tablet Take 1 tablet by mouth 2 (two) times daily. 06/22/20   Pokhrel, Corrie Mckusick, MD    Allergies    Patient has no known allergies.  Review of Systems   Review of Systems  Constitutional: Positive for fatigue. Negative for activity change, appetite change, chills, diaphoresis, fever and unexpected weight change.  HENT: Negative.   Respiratory: Negative.   Cardiovascular: Negative.   Gastrointestinal: Positive for abdominal pain. Negative for abdominal distention, anal bleeding (Suprapubic discomfort), blood in stool, constipation, diarrhea, nausea, rectal pain and vomiting.  Genitourinary: Positive for decreased urine volume, difficulty urinating,  dysuria and hematuria. Negative for enuresis.  Musculoskeletal: Negative.   Skin: Negative.   Neurological: Negative.   All other systems reviewed and are negative.   Physical Exam Updated Vital Signs BP (!) 150/67   Pulse 73   Temp 98.9 F (37.2 C) (Oral)   Resp 20   SpO2 100%   Physical Exam Vitals and nursing note reviewed.  Constitutional:      General: He is not in acute distress.    Appearance: He is well-developed. He is not ill-appearing, toxic-appearing or diaphoretic.   HENT:     Head: Normocephalic and atraumatic.     Nose: Nose normal.     Mouth/Throat:     Mouth: Mucous membranes are moist.  Eyes:     Pupils: Pupils are equal, round, and reactive to light.  Cardiovascular:     Rate and Rhythm: Normal rate and regular rhythm.     Pulses: Normal pulses.     Heart sounds: Normal heart sounds.  Pulmonary:     Effort: Pulmonary effort is normal. No respiratory distress.     Breath sounds: Normal breath sounds.  Abdominal:     General: Bowel sounds are normal. There is no distension.     Palpations: Abdomen is soft.     Tenderness: There is abdominal tenderness in the suprapubic area. There is no right CVA tenderness, left CVA tenderness, guarding or rebound.     Hernia: No hernia is present.     Comments: Mild suprapubic distention  Musculoskeletal:        General: Normal range of motion.     Cervical back: Normal range of motion and neck supple.     Comments: Moves all 4 extremities  Skin:    General: Skin is warm and dry.  Neurological:     Mental Status: He is alert.     ED Results / Procedures / Treatments   Labs (all labs ordered are listed, but only abnormal results are displayed) Labs Reviewed  COMPREHENSIVE METABOLIC PANEL - Abnormal; Notable for the following components:      Result Value   Glucose, Bld 173 (*)    Creatinine, Ser 1.25 (*)    Calcium 8.8 (*)    Total Protein 6.4 (*)    Albumin 3.1 (*)    All other components within normal limits  CBC WITH DIFFERENTIAL/PLATELET - Abnormal; Notable for the following components:   RBC 2.29 (*)    Hemoglobin 7.4 (*)    HCT 21.9 (*)    Platelets 405 (*)    Abs Immature Granulocytes 0.08 (*)    All other components within normal limits  URINALYSIS, ROUTINE W REFLEX MICROSCOPIC - Abnormal; Notable for the following components:   Color, Urine RED (*)    APPearance TURBID (*)    Glucose, UA   (*)    Value: TEST NOT REPORTED DUE TO COLOR INTERFERENCE OF URINE PIGMENT   Hgb urine  dipstick   (*)    Value: TEST NOT REPORTED DUE TO COLOR INTERFERENCE OF URINE PIGMENT   Bilirubin Urine   (*)    Value: TEST NOT REPORTED DUE TO COLOR INTERFERENCE OF URINE PIGMENT   Ketones, ur   (*)    Value: TEST NOT REPORTED DUE TO COLOR INTERFERENCE OF URINE PIGMENT   Protein, ur   (*)    Value: TEST NOT REPORTED DUE TO COLOR INTERFERENCE OF URINE PIGMENT   Nitrite   (*)    Value: TEST NOT REPORTED DUE TO  COLOR INTERFERENCE OF URINE PIGMENT   Leukocytes,Ua   (*)    Value: TEST NOT REPORTED DUE TO COLOR INTERFERENCE OF URINE PIGMENT   All other components within normal limits  URINALYSIS, MICROSCOPIC (REFLEX) - Abnormal; Notable for the following components:   Bacteria, UA FEW (*)    All other components within normal limits  URINE CULTURE  RESP PANEL BY RT-PCR (FLU A&B, COVID) ARPGX2  PROTIME-INR  TYPE AND SCREEN  PREPARE RBC (CROSSMATCH)    EKG None  Radiology No results found.  Procedures .Critical Care Performed by: Nettie Elm, PA-C Authorized by: Nettie Elm, PA-C   Critical care provider statement:    Critical care time (minutes):  35   Critical care was necessary to treat or prevent imminent or life-threatening deterioration of the following conditions:  Circulatory failure   Critical care was time spent personally by me on the following activities:  Discussions with consultants, evaluation of patient's response to treatment, examination of patient, ordering and performing treatments and interventions, ordering and review of laboratory studies, ordering and review of radiographic studies, pulse oximetry, re-evaluation of patient's condition, obtaining history from patient or surrogate and review of old charts     Medications Ordered in ED Medications  sodium chloride irrigation 0.9 % 3,000 mL (3,000 mLs Irrigation New Bag/Given 06/28/20 1305)  0.9 %  sodium chloride infusion (has no administration in time range)  ondansetron (ZOFRAN) injection 4  mg (4 mg Intravenous Given 06/28/20 1238)  morphine 4 MG/ML injection 4 mg (4 mg Intravenous Given 06/28/20 1238)  lidocaine (XYLOCAINE) 2 % jelly 1 application (1 application Urethral Given 06/28/20 1300)    ED Course  I have reviewed the triage vital signs and the nursing notes.  Pertinent labs & imaging results that were available during my care of the patient were reviewed by me and considered in my medical decision making (see chart for details).  70 year old, afebrile, nonseptic, non-ill-appearing presents for hematuria.  Recent admission for same.  Symptoms had resolved prior to discharge.  Began yesterday evening.  He has some mild suprapubic discomfort.  Generalized fatigue however no weakness or dizziness. Patient feels like he is emptying his bladder however after urinating here he still is retaining 380cc after urinating.  Blood here is gross hematuria with clots.  Work up started from triage.  Labs and imaging personally reviewed and interpreted:  CBC without leukocytosis, hemoglobin 7.4 2 point drop Metabolic panel creatinine 1.25, mild AKI, hyperglycemia, no additional electrolyte, renal or liver abnormality UA negative for infection INR 1.0  CONSULT with Dr. Claudia Desanctis with Urology. Will do continuous bladder irrigation. Does not feel needs repeat imaging at this time. She will assess in ED.  Patient reassessed.  Will need admission given two-point hemoglobin drop, continuous bladder irrigation.  Discussed with patient.  Given fatigue will give transfuse 1 unit given 2 point Hgb drop.  CONSULT with Dr. Neysa Bonito with [facility] who agrees to evaluate patient for admission.  Patient seen and evaluated by attending, Dr. Kathrynn Humble who agrees to above treatment, plan and disposition.  The patient appears reasonably stabilized for admission considering the current resources, flow, and capabilities available in the ED at this time, and I doubt any other [facility] requiring further screening and/or  treatment in the ED prior to admission.    MDM Rules/Calculators/A&P                           Final Clinical Impression(s) /  ED Diagnoses Final diagnoses:  Gross hematuria  Symptomatic anemia    Rx / DC Orders ED Discharge Orders    None       Kelyn Ponciano A, PA-C 06/28/20 [place], Ankit, MD 06/30/20 2020

## 2020-06-28 NOTE — Progress Notes (Signed)
Pt arrived to room 1422 VSS. PRBCs infusing and CBI infusing. Pt having frequent complaints of bladder pain and spasms. Spasms potentially occluding CBI flow and causing clot formation in bladder - frequent irrigation required to clear clots. Bell MD with urology paged. See new orders for B&O suppository. Pt bladder scanned and only 66mL shown on scan. B&O suppository given and morphine IV given for pain. CBI infusion flowing unobstructed at this time.   PRBC infusion completed without and signs or symptoms transfusion reaction and VSS.  Pt safety maintained. RN report given to Edwin Cap RN.

## 2020-06-28 NOTE — Progress Notes (Signed)
Date and time results received: 06/28/20 1620   Test:   COVID Critical Value: [place]   Name of Provider Notified:  Neysa Bonito MD Orders Received? Or Actions Taken?:  Patient already placed on isolation d/t result still pending from ED. Transferring now to appropriate unit

## 2020-06-28 NOTE — H&P (Addendum)
History and Physical        Hospital Admission Note Date: 06/28/2020  Patient name: Eddie Mendoza Medical record number: [id number] Date of birth: May 24, 1950 Age: 70 y.o. Gender: male  PCP: [facility]    Chief Complaint    Chief Complaint  Patient presents with  . Dysuria      HPI:   This is a 70 year old male with past medical history of hypertension, distant tobacco use, recent hospitalization from 4/2-4/4 for hematuria and CBI who presented to the ED with complaints of hematuria with clots and urinary retention x1 day.  He had his catheter removed 6 days ago and completed his bactrim.  At presentation he admitted to the ED provider to generalized fatigue, mild suprapubic discomfort and dysuria, though currently denies any fatigue.  No fever, chills, nausea, vomiting or any other complaints.  Denies any aspirin, Plavix or NSAID use or any other blood thinners.   ED Course: Afebrile, hemodynamically stable, on room air. Notable Labs: BUN 16, creatinine 1.25 (0.92 on 4/4), WBC 8.0, Hb 7.4 (9.2 on 4/4), platelets 405, INR 1.0, COVID-19 pending.  Dr. Claudia Desanctis of urology was consulted and recommended continuous bladder irrigation.  Patient was transfused 1 unit PRBCs due to symptomatic anemia.   Vitals:   06/28/20 1545 06/28/20 1615  BP: (!) 132/58 132/69  Pulse: 60 60  Resp: 13 13  Temp: 98.6 F (37 C) 98.6 F (37 C)  SpO2: 100% 100%     Review of Systems:  Review of Systems  All other systems reviewed and are negative.   Medical/Social/Family History   Past Medical History: Past Medical History:  Diagnosis Date  . Hypertension     History reviewed. No pertinent surgical history.  Medications: Prior to Admission medications   Medication Sig Start Date End Date Taking? Authorizing Provider  chlorthalidone (HYGROTON) 25 MG tablet Take 25 mg by mouth  daily.    [provider]  colchicine 0.6 MG tablet Take 0.6mg  (one tablet) by mouth every 1-2 hours until one of the following occurs: 1.  The pain is gone 2.  The maximum dose has been given ( no more than 3 tabs in 3 hours or 10 tabs in 24 hours) 3.  The side effects outweight the benefits 09/25/15   Pisciotta, Elmyra Ricks, PA-C  lidocaine (XYLOCAINE) 5 % ointment APPLY SMALL AMOUNT TO AFFECTED AREA FOUR TIMES A DAY AS NEEDED 04/15/20   [provider]  losartan (COZAAR) 25 MG tablet Take 12.5 mg by mouth daily.    [provider]  nicotine polacrilex (COMMIT) 2 MG lozenge DISSOLVE 1 LOZENGE IN MOUTH EVERY FOUR HOURS AS NEEDED 01/29/20   [provider]  polyethylene glycol (MIRALAX / GLYCOLAX) 17 g packet Take 17 g by mouth daily as needed for mild constipation. 06/22/20   Pokhrel, Corrie Mckusick, MD  sulfamethoxazole-trimethoprim (BACTRIM DS) 800-160 MG tablet Take 1 tablet by mouth 2 (two) times daily. 06/22/20   Pokhrel, Corrie Mckusick, MD    Allergies:  No Known Allergies  Social History:  reports that he has been smoking cigarettes. He has a 15.00 pack-year smoking history. He has never used smokeless tobacco. He reports current alcohol use of about 3.0 standard  drinks of alcohol per week. He reports that he does not use drugs.  Family History: History reviewed. No pertinent family history.   Objective   Physical Exam: Blood pressure 132/69, pulse 60, temperature 98.6 F (37 C), temperature source Oral, resp. rate 13, height 7\' 1"  (2.159 m), weight 99.8 kg, SpO2 100 %.  Physical Exam Vitals and nursing note reviewed.  Constitutional:      Appearance: Normal appearance.  HENT:     Head: Normocephalic and atraumatic.  Eyes:     Conjunctiva/sclera: Conjunctivae normal.  Cardiovascular:     Rate and Rhythm: Normal rate and regular rhythm.  Pulmonary:     Effort: Pulmonary effort is normal.     Breath sounds: Normal breath sounds.  Abdominal:     General: Abdomen  is flat.     Palpations: Abdomen is soft.     Tenderness: There is abdominal tenderness in the suprapubic area.  Genitourinary:    Comments: Gross hematuria in Foley bag Musculoskeletal:        General: No swelling or tenderness.  Skin:    Coloration: Skin is not jaundiced or pale.  Neurological:     Mental Status: He is alert. Mental status is at baseline.  Psychiatric:        Mood and Affect: Mood normal.        Behavior: Behavior normal.     LABS on Admission: I have personally reviewed all the labs and imaging below    Basic Metabolic Panel: Recent Labs  Lab 06/22/20 0040 06/28/20 1105  NA 137 139  K 3.6 3.7  CL 106 107  CO2 26 24  GLUCOSE 144* 173*  BUN 8 16  CREATININE 0.92 1.25*  CALCIUM 8.6* 8.8*  MG 1.9  --   PHOS 2.2*  --    Liver Function Tests: Recent Labs  Lab 06/22/20 0040 06/28/20 1105  AST 15 15  ALT 8 16  ALKPHOS 41 59  BILITOT 0.4 0.4  PROT 5.9* 6.4*  ALBUMIN 2.9* 3.1*   No results for input(s): LIPASE, AMYLASE in the last 168 hours. No results for input(s): AMMONIA in the last 168 hours. CBC: Recent Labs  Lab 06/22/20 0647 06/28/20 1105  WBC 14.3* 8.0  NEUTROABS  --  5.7  HGB 9.2* 7.4*  HCT 27.3* 21.9*  MCV 95.8 95.6  PLT 264 405*   Cardiac Enzymes: No results for input(s): CKTOTAL, CKMB, CKMBINDEX, TROPONINI in the last 168 hours. BNP: Invalid input(s): POCBNP CBG: No results for input(s): GLUCAP in the last 168 hours.  Radiological Exams on Admission:  No results found.    EKG: normal EKG, normal sinus rhythm   A & P   Principal Problem:   Hematuria Active Problems:   HTN (hypertension)       Acute blood loss anemia   COVID-19 virus infection   1. Gross hematuria unclear etiology a. Urology consulted: Continuous bladder irrigation initiated, patient will need CT urogram to evaluate upper GU tract as well as cystoscopy to evaluate lower tract b. Will make n.p.o. after midnight in case he needs any  procedures  2. Acute blood loss anemia secondary to #1 a. Hb 9.2-> 7.4 in 6 days b. BP stable c. 1 unit PRBCs ordered by the ED provider d. Trend H&H posttransfusion e. Transfuse if symptomatic or hemoglobin <7.0  3. Elevated creatinine, likely due to postobstructive process from blood clots a. Creatinine 1.25, baseline 0.9-1 b. Follow-up now that the patient has a Foley in place  c. Hold chlorthalidone and losartan  4. Hypertension a. Holding chlorthalidone and losartan b. As needed hydralazine  5. Incidental asymptomatic COVID 19 + a. CT 43.2 indicating this is likely an old/resolving infection b. Hold off on any treatment c. Continue airborne precautions    DVT prophylaxis: SCDs   Code Status: Full Code  Diet: Heart healthy Family Communication: Admission, patients condition and plan of care including tests being ordered have been discussed with the patient who indicates understanding and agrees with the plan and Code Status. Disposition Plan: The appropriate patient status for this patient is INPATIENT. Inpatient status is judged to be reasonable and necessary in order to provide the required intensity of service to ensure the patient's safety. The patient's presenting symptoms, physical exam findings, and initial radiographic and laboratory data in the context of their chronic comorbidities is felt to place them at high risk for further clinical deterioration. Furthermore, it is not anticipated that the patient will be medically stable for discharge from the hospital within 2 midnights of admission. The following factors support the patient status of inpatient.   " The patient's presenting symptoms include hematuria. " The worrisome physical exam findings include hematuria. " The initial radiographic and laboratory data are worrisome because of elevated creatinine, anemia. " The chronic co-morbidities include hypertension.   * I certify that at the point of admission it is my  clinical judgment that the patient will require inpatient hospital care spanning beyond 2 midnights from the point of admission due to high intensity of service, high risk for further deterioration and high frequency of surveillance required.*   Consultants  . Urology  Procedures  . CBI  Time Spent on Admission: 67 minutes    Harold Hedge, DO [facility]  06/28/2020, 4:27 PM

## 2020-06-29 LAB — CBC
HCT: 22.6 % — ABNORMAL LOW (ref 39.0–52.0)
Hemoglobin: 7.4 g/dL — ABNORMAL LOW (ref 13.0–17.0)
MCH: 31.4 pg (ref 26.0–34.0)
MCHC: 32.7 g/dL (ref 30.0–36.0)
MCV: 95.8 fL (ref 80.0–100.0)
Platelets: 369 10*3/uL (ref 150–400)
RBC: 2.36 MIL/uL — ABNORMAL LOW (ref 4.22–5.81)
RDW: 13.9 % (ref 11.5–15.5)
WBC: 12.4 10*3/uL — ABNORMAL HIGH (ref 4.0–10.5)
nRBC: 0 % (ref 0.0–0.2)

## 2020-06-29 LAB — BASIC METABOLIC PANEL
Anion gap: 7 (ref 5–15)
BUN: 12 mg/dL (ref 8–23)
CO2: 24 mmol/L (ref 22–32)
Calcium: 8.5 mg/dL — ABNORMAL LOW (ref 8.9–10.3)
Chloride: 105 mmol/L (ref 98–111)
Creatinine, Ser: 1.04 mg/dL (ref 0.61–1.24)
GFR, Estimated: >60 mL/min (ref >60)
Glucose, Bld: 109 mg/dL — ABNORMAL HIGH (ref 70–99)
Potassium: 3.5 mmol/L (ref 3.5–5.1)
Sodium: 136 mmol/L (ref 135–145)

## 2020-06-29 LAB — URINE CULTURE: Culture: NO GROWTH

## 2020-06-29 MED ORDER — CHLORHEXIDINE GLUCONATE CLOTH 2 % EX PADS
6.0000 | MEDICATED_PAD | Freq: Every day | CUTANEOUS | Status: DC
  Administered 2020-06-29 – 2020-06-30 (×2): 6 via TOPICAL

## 2020-06-29 MED ORDER — FINASTERIDE 5 MG PO TABS
5.0000 mg | ORAL_TABLET | Freq: Every day | ORAL | Status: DC
  Administered 2020-06-29 – 2020-07-01 (×3): 5 mg via ORAL
  Filled 2020-06-29 (×3): qty 1

## 2020-06-29 NOTE — Progress Notes (Signed)
Pt on CBI  moderate rate experiencing pain and spasm in lower abdominal area. I offered medication to reduce spasm and pain in addition to hand irrigation to remove clots. Pt refused and asked to see DR. Immediately. I had a face-to face encounter with Dr. Avon Gully, paged Dr. Jeffie Pollock, and Dr. Claudia Desanctis.   Dr. Jeffie Pollock visited Pt at bedside repositioned catheter and hand irrigate some clots out. Both Urologist ordered CBI stopped with frequent checks for clots and hand irrigate as needed.  CBI was restarted per Dr. Jeffie Pollock.   NB: Marissa Longs's note

## 2020-06-29 NOTE — Progress Notes (Signed)
Hand irrigation performed prior to resuming CBI. Small clot removed with irrigation. CBI started at slow rate per Dr Jeffie Pollock

## 2020-06-29 NOTE — Progress Notes (Signed)
PROGRESS NOTE    Eddie Mendoza  YKD:[id number] DOB: 1950/12/19 DOA: 06/28/2020 PCP: [facility]   Brief Narrative:  70 year old male with past medical history of hypertension, distant tobacco use, recent hospitalization from 4/2-4/4 for hematuria and CBI who presented to the ED with complaints of hematuria with clots and urinary retention x1 day.  He had his catheter removed 6 days ago and completed his bactrim.  At presentation he admitted to the ED provider to generalized fatigue, mild suprapubic discomfort and dysuria, though currently denies any fatigue.  No fever, chills, nausea, vomiting or any other complaints.  Denies any aspirin, Plavix or NSAID use or any other blood thinners. Dr. Claudia Desanctis of urology was consulted and recommended continuous bladder irrigation. Patient was transfused 1 unit PRBCs due to symptomatic anemia at intake.  Assessment & Plan:   Principal Problem:   Hematuria Active Problems:   HTN (hypertension)       Acute blood loss anemia   COVID-19 virus infection   Gross hematuria unclear etiology Acute symptomatic blood loss anemia, POA - Urology consulted: Recommending CBI, CT urogram and cystoscopy.  Appreciate insight and recommendations  -N.p.o. pending procedure  -Hemoglobin stable at 7.4 despite 1 unit PRBC at intake indicating ongoing bleeding; baseline around 9 last week -Foley continues to put out pink to light red urine with CBI  AKI in the setting of obstruction secondary to above with postoperative blood clots, resolving - Creatinine already back to baseline, continue to follow - Foley continues to put out light red to pink urine as above with CBI - Hold chlorthalidone and losartan until urological procedures can be performed  Hypertension Holding chlorthalidone and losartan As needed hydralazine  Incidental asymptomatic COVID 19 + Cycle threshold 43.2 indicating this is likely an old/resolving infection or very early infectious  process Hold off on any treatment given patient is without hypoxia, respiratory symptoms or chest x-ray findings Continue airborne precautions for 10 days per protocol  DVT prophylaxis: SCDs only given ongoing bleed as above Code Status: Full Family Communication: None present  Status is: Impaired  Dispo: The patient is from: Home              Anticipated d/c is to: Home              Anticipated d/c date is: >53h              Patient currently not medically stable for discharge given ongoing need for urological procedure, ongoing bleeding and close monitoring  Consultants:   Urology, Dr. Claudia Desanctis  Procedures:   Tentatively plan cystoscopy  Antimicrobials:  None indicated  Subjective: No acute issues or events overnight  Objective: Vitals:   06/28/20 1902 06/28/20 2310 06/29/20 0415 06/29/20 0706  BP: (!) 157/77 126/68 130/61 (!) 141/44  Pulse: 82 76 (!) 57 (!) 59  Resp: 18 16 17 17   Temp: 98.1 F (36.7 C) 98.5 F (36.9 C) 98.3 F (36.8 C) 98.5 F (36.9 C)  TempSrc: Oral Oral Oral Oral  SpO2: 100% 100% 100% 100%  Weight:      Height:        Intake/Output Summary (Last 24 hours) at 06/29/2020 0711 Last data filed at 06/29/2020 0600 Gross per 24 hour  Intake 4079.17 ml  Output 22400 ml  Net -18320.83 ml   Filed Weights   06/28/20 1520  Weight: 99.8 kg    Examination:  General:  Pleasantly resting in bed, No acute distress. HEENT:  Normocephalic atraumatic.  Sclerae nonicteric, noninjected.  Extraocular movements intact bilaterally. Neck:  Without mass or deformity.  Trachea is midline. Lungs:  Clear to auscultate bilaterally without rhonchi, wheeze, or rales. Heart:  Regular rate and rhythm.  Without murmurs, rubs, or gallops. Abdomen:  Soft, minimally tender suprapubic area, nondistended.  Without guarding or rebound. GU: CBI ongoing, Foley putting out light pink to light red urine; notable leakage around the meatus. Extremities: Without cyanosis, clubbing,  edema, or obvious deformity. Vascular:  Dorsalis pedis and posterior tibial pulses palpable bilaterally. Skin:  Warm and dry, no erythema, no ulcerations.  Data Reviewed: I have personally reviewed following labs and imaging studies  CBC: Recent Labs  Lab 06/28/20 1105 06/28/20 1912 06/29/20 0034  WBC 8.0  --  12.4*  NEUTROABS 5.7  --   --   HGB 7.4* 8.0* 7.4*  HCT 21.9* 24.8* 22.6*  MCV 95.6  --  95.8  PLT 405*  --  035   Basic Metabolic Panel: Recent Labs  Lab 06/28/20 1105 06/29/20 0034  NA 139 136  K 3.7 3.5  CL 107 105  CO2 24 24  GLUCOSE 173* 109*  BUN 16 12  CREATININE 1.25* 1.04  CALCIUM 8.8* 8.5*   GFR: Estimated Creatinine Clearance: 92 mL/min (by C-G formula based on SCr of 1.04 mg/dL). Liver Function Tests: Recent Labs  Lab 06/28/20 1105  AST 15  ALT 16  ALKPHOS 59  BILITOT 0.4  PROT 6.4*  ALBUMIN 3.1*   No results for input(s): LIPASE, AMYLASE in the last 168 hours. No results for input(s): AMMONIA in the last 168 hours. Coagulation Profile: Recent Labs  Lab 06/28/20 1217  INR 1.0   Cardiac Enzymes: No results for input(s): CKTOTAL, CKMB, CKMBINDEX, TROPONINI in the last 168 hours. BNP (last 3 results) No results for input(s): PROBNP in the last 8760 hours. HbA1C: No results for input(s): HGBA1C in the last 72 hours. CBG: No results for input(s): GLUCAP in the last 168 hours. Lipid Profile: No results for input(s): CHOL, HDL, LDLCALC, TRIG, CHOLHDL, LDLDIRECT in the last 72 hours. Thyroid Function Tests: No results for input(s): TSH, T4TOTAL, FREET4, T3FREE, THYROIDAB in the last 72 hours. Anemia Panel: No results for input(s): VITAMINB12, FOLATE, FERRITIN, TIBC, IRON, RETICCTPCT in the last 72 hours. Sepsis Labs: No results for input(s): PROCALCITON, LATICACIDVEN in the last 168 hours.  Recent Results (from the past 240 hour(s))  SARS CORONAVIRUS 2 (TAT 6-24 HRS) Nasopharyngeal Nasopharyngeal Swab     Status: None   Collection  Time: 06/20/20  5:26 PM   Specimen: Nasopharyngeal Swab  Result Value Ref Range Status   SARS Coronavirus 2 NEGATIVE NEGATIVE Final    Comment: (NOTE) SARS-CoV-2 target nucleic acids are NOT DETECTED.  The SARS-CoV-2 RNA is generally detectable in upper and lower respiratory specimens during the acute phase of infection. Negative results do not preclude SARS-CoV-2 infection, do not rule out co-infections with other pathogens, and should not be used as the sole basis for treatment or other patient management decisions. Negative results must be combined with clinical observations, patient history, and epidemiological information. The expected result is Negative.  Fact Sheet for Patients: SugarRoll.be  Fact Sheet for Healthcare Providers: https://www.woods-mathews.com/  This test is not yet approved or cleared by the [place] FDA and  has been authorized for detection and/or diagnosis of SARS-CoV-2 by FDA under an Emergency Use Authorization (EUA). This EUA will remain  in effect (meaning this test can be used) for the duration of the COVID-19  declaration under Se ction 564(b)(1) of the Act, 21 U.S.C. section 360bbb-3(b)(1), unless the authorization is terminated or revoked sooner.  Performed at [facility], [place] [address], [place], [place] [address]   Urine Culture     Status: None   Collection Time: 06/20/20  8:53 PM   Specimen: Urine, Catheterized  Result Value Ref Range Status   Specimen Description   Final    URINE, CATHETERIZED Performed at [facility], [address], [place], [place] [address]    Special Requests   Final    NONE Performed at [facility], [address] [place], [place] [address]    Culture   Final    NO GROWTH Performed at [facility], [place] [address] [place], [place] [address]    Report Status 06/22/2020 FINAL  Final  Resp Panel by RT-PCR (Flu A&B, Covid) Nasopharyngeal Swab      Status: Abnormal   Collection Time: 06/28/20  1:22 PM   Specimen: Nasopharyngeal Swab; Nasopharyngeal(NP) swabs in vial transport medium  Result Value Ref Range Status   SARS Coronavirus 2 by RT PCR POSITIVE (A) NEGATIVE Final    Comment: RESULT CALLED TO, READ BACK BY AND VERIFIED WITH: STISKA,J RN @1606  ON 06/28/20 JACKSON,K (NOTE) SARS-CoV-2 target nucleic acids are DETECTED.  The SARS-CoV-2 RNA is generally detectable in upper respiratory specimens during the acute phase of infection. Positive results are indicative of the presence of the identified virus, but do not rule out bacterial infection or co-infection with other pathogens not detected by the test. Clinical correlation with patient history and other diagnostic information is necessary to determine patient infection status. The expected result is Negative.  Fact Sheet for Patients: EntrepreneurPulse.com.au  Fact Sheet for Healthcare Providers: IncredibleEmployment.be  This test is not yet approved or cleared by the [place] FDA and  has been authorized for detection and/or diagnosis of SARS-CoV-2 by FDA under an Emergency Use Authorization (EUA).  This EUA will remain in effect (meaning this test c an be used) for the duration of  the COVID-19 declaration under Section 564(b)(1) of the Act, 21 U.S.C. section 360bbb-3(b)(1), unless the authorization is terminated or revoked sooner.     Influenza A by PCR NEGATIVE NEGATIVE Final   Influenza B by PCR NEGATIVE NEGATIVE Final    Comment: (NOTE) The Xpert Xpress SARS-CoV-2/FLU/RSV plus assay is intended as an aid in the diagnosis of influenza from Nasopharyngeal swab specimens and should not be used as a sole basis for treatment. Nasal washings and aspirates are unacceptable for Xpert Xpress SARS-CoV-2/FLU/RSV testing.  Fact Sheet for Patients: EntrepreneurPulse.com.au  Fact Sheet for Healthcare  Providers: IncredibleEmployment.be  This test is not yet approved or cleared by the [place] FDA and has been authorized for detection and/or diagnosis of SARS-CoV-2 by FDA under an Emergency Use Authorization (EUA). This EUA will remain in effect (meaning this test can be used) for the duration of the COVID-19 declaration under Section 564(b)(1) of the Act, 21 U.S.C. section 360bbb-3(b)(1), unless the authorization is terminated or revoked.  Performed at [facility], [place] [address], [place], [place] [address]      Radiology Studies: No results found.  Scheduled Meds: Continuous Infusions: . sodium chloride irrigation       LOS: 1 day   Time spent: 8min  Laurren Lepkowski C Harsh Trulock, DO [facility]  If 7PM-7AM, please contact night-coverage [email or web address]  06/29/2020, 7:11 AM

## 2020-06-29 NOTE — Progress Notes (Signed)
Patient ID: Eddie Mendoza, male   DOB: 1951-02-10, 70 y.o.   MRN: [id number]    Subjective: Cc: Gross hematuria with clot retention.  Eddie Mendoza was having pain earlier today with a distended bladder on CBI with clot obstruction of the catheter.  I repositioned the catheter and irrigated out some clots.  He is doing better this afternoon.  The urine remains dark but the catheter is draining and it clears to some degree with CBI.   He has some bleeding around the foley consistent with prostate bleeding.   ROS:  ROS  Anti-infectives: Anti-infectives (From admission, onward)   None      Current Facility-Administered Medications  Medication Dose Route Frequency Provider Last Rate Last Admin  . acetaminophen (TYLENOL) tablet 650 mg  650 mg Oral Q6H PRN Harold Hedge, MD       Or  . acetaminophen (TYLENOL) suppository 650 mg  650 mg Rectal Q6H PRN Harold Hedge, MD      . belladonna-opium (B&O) suppository 16.2-60mg   1 suppository Rectal Q8H PRN Lucas Mallow, MD   1 suppository at 06/29/20 0048  . Chlorhexidine Gluconate Cloth 2 % PADS 6 each  6 each Topical Daily Little Ishikawa, MD   6 each at 06/29/20 1101  . finasteride (PROSCAR) tablet 5 mg  5 mg Oral Daily Irine Seal, MD      . hydrALAZINE (APRESOLINE) injection 5 mg  5 mg Intravenous Q8H PRN Harold Hedge, MD      . morphine 2 MG/ML injection 2 mg  2 mg Intravenous Q4H PRN Harold Hedge, MD   2 mg at 06/29/20 1132  . polyethylene glycol (MIRALAX / GLYCOLAX) packet 17 g  17 g Oral Daily PRN Harold Hedge, MD      . sodium chloride irrigation 0.9 % 3,000 mL  3,000 mL Irrigation Continuous Harold Hedge, MD   3,000 mL at 06/29/20 0653     Objective: Vital signs in last 24 hours: Temp:  [98.1 F (36.7 C)-98.6 F (37 C)] 98.6 F (37 C) (04/11 1453) Pulse Rate:  [57-82] 69 (04/11 1453) Resp:  [16-18] 16 (04/11 1453) BP: (125-157)/(44-77) 125/69 (04/11 1453) SpO2:  [100 %] 100 % (04/11 1453)  Intake/Output from  previous day: 04/10 0701 - 04/11 0700 In: 4079.2 [Blood:379.2] Out: 26600 [Urine:26600] Intake/Output this shift: Total I/O In: 0  Out: 13550 [Urine:13550]   Physical Exam  Lab Results:  Recent Labs    06/28/20 1105 06/28/20 1912 06/29/20 0034  WBC 8.0  --  12.4*  HGB 7.4* 8.0* 7.4*  HCT 21.9* 24.8* 22.6*  PLT 405*  --  369   BMET Recent Labs    06/28/20 1105 06/29/20 0034  NA 139 136  K 3.7 3.5  CL 107 105  CO2 24 24  GLUCOSE 173* 109*  BUN 16 12  CREATININE 1.25* 1.04  CALCIUM 8.8* 8.5*   PT/INR Recent Labs    06/28/20 1217  LABPROT 12.8  INR 1.0   ABG No results for input(s): PHART, HCO3 in the last 72 hours.  Invalid input(s): PCO2, PO2  Studies/Results: No results found.  Procedure:   I was called by the nurse for bleeding and catheter obstruction.   I attempted irrigation with limited success and then found that the foley was not fully advanced and the balloon was probably inflated in the prostatic urethra.   I repositioned the foley and was then able to irrigate him with improved  success and return of some old clot.     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   His foley appeared to be in the prostatic urethra and he is draining better after repositioning and irrigation.  I will let him eat tonight and will start him on finasteride because of the size of his prostate and the bleeding.   He will need cystoscopy but it will be more productive when the bleeding has stopped.   I will order a bladder US in the AM to reassess the clot burden.        LOS: 1 day    Irine Seal 06/29/2020 [phone]

## 2020-06-29 NOTE — Progress Notes (Signed)
Pt foley has clotted off and patient restless in bed. Hand irrigation performed with several clots removed. Patient still with distended abdomen. Dr Jeffie Pollock at bedside and resumed hand irrigation with clot removal and foley repositioned, patient now stating more comfrotable. Verbal orders at this time to stop CBI and hand irrigate PRN. Writer continued to hand irrigate with more clot removal. Will continue to monitor.

## 2020-06-29 NOTE — Progress Notes (Addendum)
Despite cbi running completely open both the in port and out port clotted and required hand irrigation. 3 small clots expressed and flow restored.   Pt had a spasm and noted blood flowing into the cbi bag on the pole. It seems the clots that had to be irrigated by hand clogged the tubes directly after.

## 2020-06-29 NOTE — Plan of Care (Signed)
  Problem: Pain Managment: Goal: General experience of comfort will improve Outcome: Progressing   Problem: Elimination: Goal: Will not experience complications related to urinary retention Outcome: Progressing   Problem: Coping: Goal: Level of anxiety will decrease Outcome: Progressing   Problem: Nutrition: Goal: Adequate nutrition will be maintained Outcome: Progressing

## 2020-06-29 NOTE — Plan of Care (Signed)

## 2020-06-30 ENCOUNTER — Inpatient Hospital Stay (HOSPITAL_COMMUNITY): Payer: No Typology Code available for payment source | Admitting: Anesthesiology

## 2020-06-30 ENCOUNTER — Inpatient Hospital Stay (HOSPITAL_COMMUNITY): Payer: No Typology Code available for payment source

## 2020-06-30 ENCOUNTER — Encounter (HOSPITAL_COMMUNITY)
Admission: EM | Disposition: A | Discharge status: Home / Self Care | Payer: Self-pay | Source: Home / Self Care | Attending: Internal Medicine

## 2020-06-30 ENCOUNTER — Encounter (HOSPITAL_COMMUNITY): Payer: Self-pay | Admitting: Internal Medicine

## 2020-06-30 HISTORY — PX: TRANSURETHRAL RESECTION OF BLADDER TUMOR: SHX2575

## 2020-06-30 LAB — SURGICAL PCR SCREEN
MRSA, PCR: NEGATIVE
Staphylococcus aureus: POSITIVE — AB

## 2020-06-30 LAB — BASIC METABOLIC PANEL
Anion gap: 9 (ref 5–15)
BUN: 10 mg/dL (ref 8–23)
CO2: 22 mmol/L (ref 22–32)
Calcium: 8.4 mg/dL — ABNORMAL LOW (ref 8.9–10.3)
Chloride: 104 mmol/L (ref 98–111)
Creatinine, Ser: 0.95 mg/dL (ref 0.61–1.24)
GFR, Estimated: >60 mL/min (ref >60)
Glucose, Bld: 117 mg/dL — ABNORMAL HIGH (ref 70–99)
Potassium: 3.6 mmol/L (ref 3.5–5.1)
Sodium: 135 mmol/L (ref 135–145)

## 2020-06-30 LAB — CBC
HCT: 21.3 % — ABNORMAL LOW (ref 39.0–52.0)
Hemoglobin: 7.1 g/dL — ABNORMAL LOW (ref 13.0–17.0)
MCH: 32 pg (ref 26.0–34.0)
MCHC: 33.3 g/dL (ref 30.0–36.0)
MCV: 95.9 fL (ref 80.0–100.0)
Platelets: 396 10*3/uL (ref 150–400)
RBC: 2.22 MIL/uL — ABNORMAL LOW (ref 4.22–5.81)
RDW: 13.5 % (ref 11.5–15.5)
WBC: 15.7 10*3/uL — ABNORMAL HIGH (ref 4.0–10.5)
nRBC: 0 % (ref 0.0–0.2)

## 2020-06-30 LAB — PREPARE RBC (CROSSMATCH)

## 2020-06-30 SURGERY — TURBT (TRANSURETHRAL RESECTION OF BLADDER TUMOR)
Anesthesia: General | Site: Bladder

## 2020-06-30 MED ORDER — MEPERIDINE HCL 50 MG/ML IJ SOLN: 6.2500 mg | INTRAMUSCULAR | Status: DC | PRN

## 2020-06-30 MED ORDER — ONDANSETRON HCL 4 MG/2ML IJ SOLN
INTRAMUSCULAR | Status: AC
  Filled 2020-06-30: qty 2

## 2020-06-30 MED ORDER — CEFAZOLIN (ANCEF) 1 G IV SOLR: 2.0000 g | INTRAVENOUS | Status: DC

## 2020-06-30 MED ORDER — DEXAMETHASONE SODIUM PHOSPHATE 10 MG/ML IJ SOLN
INTRAMUSCULAR | Status: DC | PRN
  Administered 2020-06-30: 10 mg via INTRAVENOUS

## 2020-06-30 MED ORDER — LACTATED RINGERS IV SOLN: INTRAVENOUS | Status: DC | PRN

## 2020-06-30 MED ORDER — PROPOFOL 10 MG/ML IV BOLUS
INTRAVENOUS | Status: DC | PRN
  Administered 2020-06-30: 200 mg via INTRAVENOUS

## 2020-06-30 MED ORDER — CEFAZOLIN SODIUM-DEXTROSE 2-4 GM/100ML-% IV SOLN
2.0000 g | INTRAVENOUS | Status: AC
  Administered 2020-06-30: 2 g via INTRAVENOUS

## 2020-06-30 MED ORDER — FENTANYL CITRATE (PF) 100 MCG/2ML IJ SOLN
INTRAMUSCULAR | Status: AC
  Administered 2020-06-30: 50 ug via INTRAVENOUS
  Filled 2020-06-30: qty 2

## 2020-06-30 MED ORDER — LIDOCAINE 2% (20 MG/ML) 5 ML SYRINGE
INTRAMUSCULAR | Status: DC | PRN
  Administered 2020-06-30: 100 mg via INTRAVENOUS

## 2020-06-30 MED ORDER — PROPOFOL 10 MG/ML IV BOLUS
INTRAVENOUS | Status: AC
  Filled 2020-06-30: qty 20

## 2020-06-30 MED ORDER — FENTANYL CITRATE (PF) 100 MCG/2ML IJ SOLN
INTRAMUSCULAR | Status: DC | PRN
  Administered 2020-06-30: 50 ug via INTRAVENOUS
  Administered 2020-06-30 (×3): 25 ug via INTRAVENOUS
  Administered 2020-06-30: 50 ug via INTRAVENOUS
  Administered 2020-06-30: 25 ug via INTRAVENOUS

## 2020-06-30 MED ORDER — ONDANSETRON HCL 4 MG/2ML IJ SOLN
INTRAMUSCULAR | Status: DC | PRN
  Administered 2020-06-30: 4 mg via INTRAVENOUS

## 2020-06-30 MED ORDER — DEXAMETHASONE SODIUM PHOSPHATE 10 MG/ML IJ SOLN
INTRAMUSCULAR | Status: AC
  Filled 2020-06-30: qty 1

## 2020-06-30 MED ORDER — MORPHINE SULFATE (PF) 2 MG/ML IV SOLN
2.0000 mg | INTRAVENOUS | Status: DC | PRN
  Filled 2020-06-30: qty 1

## 2020-06-30 MED ORDER — CEFAZOLIN SODIUM-DEXTROSE 2-4 GM/100ML-% IV SOLN
INTRAVENOUS | Status: AC
  Filled 2020-06-30: qty 100

## 2020-06-30 MED ORDER — ACETAMINOPHEN 325 MG PO TABS: 325.0000 mg | ORAL_TABLET | ORAL | Status: DC | PRN

## 2020-06-30 MED ORDER — OXYCODONE HCL 5 MG PO TABS: 5.0000 mg | ORAL_TABLET | Freq: Once | ORAL | Status: DC | PRN

## 2020-06-30 MED ORDER — SODIUM CHLORIDE 0.9% IV SOLUTION: Freq: Once | INTRAVENOUS | Status: AC

## 2020-06-30 MED ORDER — FENTANYL CITRATE (PF) 100 MCG/2ML IJ SOLN
25.0000 ug | INTRAMUSCULAR | Status: DC | PRN
  Administered 2020-06-30: 50 ug via INTRAVENOUS

## 2020-06-30 MED ORDER — SODIUM CHLORIDE 0.9 % IR SOLN
Status: DC | PRN
  Administered 2020-06-30 (×4): 3000 mL

## 2020-06-30 MED ORDER — ACETAMINOPHEN 160 MG/5ML PO SOLN: 325.0000 mg | ORAL | Status: DC | PRN

## 2020-06-30 MED ORDER — ONDANSETRON HCL 4 MG/2ML IJ SOLN: 4.0000 mg | Freq: Once | INTRAMUSCULAR | Status: DC | PRN

## 2020-06-30 MED ORDER — FENTANYL CITRATE (PF) 100 MCG/2ML IJ SOLN
INTRAMUSCULAR | Status: AC
  Filled 2020-06-30: qty 2

## 2020-06-30 MED ORDER — OXYCODONE HCL 5 MG/5ML PO SOLN: 5.0000 mg | Freq: Once | ORAL | Status: DC | PRN

## 2020-06-30 MED ORDER — LIDOCAINE 2% (20 MG/ML) 5 ML SYRINGE
INTRAMUSCULAR | Status: AC
  Filled 2020-06-30: qty 5

## 2020-06-30 MED ORDER — FENTANYL CITRATE (PF) 100 MCG/2ML IJ SOLN: 25.0000 ug | INTRAMUSCULAR | Status: DC | PRN

## 2020-06-30 SURGICAL SUPPLY — 16 items
BAG URO CATCHER STRL LF (MISCELLANEOUS) ×2 IMPLANT
DRAPE FOOT SWITCH (DRAPES) ×2 IMPLANT
ELECT REM PT RETURN 15FT ADLT (MISCELLANEOUS) ×2 IMPLANT
GLOVE SURG ENC TEXT LTX SZ7.5 (GLOVE) ×2 IMPLANT
GOWN STRL REUS W/TWL XL LVL3 (GOWN DISPOSABLE) ×2 IMPLANT
KIT TURNOVER KIT A (KITS) ×2 IMPLANT
LOOP CUT BIPOLAR 24F LRG (ELECTROSURGICAL) ×1 IMPLANT
MANIFOLD NEPTUNE II (INSTRUMENTS) ×2 IMPLANT
NDL SAFETY ECLIPSE 18X1.5 (NEEDLE) ×1 IMPLANT
NEEDLE HYPO 18GX1.5 SHARP (NEEDLE) ×2
PACK CYSTO (CUSTOM PROCEDURE TRAY) ×2 IMPLANT
PENCIL SMOKE EVACUATOR (MISCELLANEOUS) IMPLANT
SYR TOOMEY IRRIG 70ML (MISCELLANEOUS) ×2
SYRINGE TOOMEY IRRIG 70ML (MISCELLANEOUS) IMPLANT
TUBING CONNECTING 10 (TUBING) ×2 IMPLANT
TUBING UROLOGY SET (TUBING) ×2 IMPLANT

## 2020-06-30 NOTE — Op Note (Signed)
Preoperative diagnosis:  1. Gross hematuria clot retention  Postoperative diagnosis:  1. Same  Procedure: 1. Cystoscopy, clot evacuation and fulguration  Surgeon: Ardis Hughs, MD  Anesthesia: General  Complications: None  Intraoperative findings:  #1: The patient has a very large prostate with a very high median bar and intravesical median lobe.  The patient's bladder was very heavily trabeculated but there was no discrete tumors or other significant pathologic findings within the bladder itself.  Most of the patient's bleeding was coming from the patient's median lobe and intravesical prostate.  I fulgurated all of this and evacuated the patient's clots which seemed to resolve the bleeding.  EBL: Minimal  Specimens: None  Indication: Eddie Mendoza is a 70 y.o. patient with gross hematuria and clot retention.  After reviewing the management options for treatment, he elected to proceed with the above surgical procedure(s). We have discussed the potential benefits and risks of the procedure, side effects of the proposed treatment, the likelihood of the patient achieving the goals of the procedure, and any potential problems that might occur during the procedure or recuperation. Informed consent has been obtained.  Description of procedure:  The patient was taken to the operating room and general anesthesia was induced.  The patient was placed in the dorsal lithotomy position, prepped and draped in the usual sterile fashion, and preoperative antibiotics were administered. A preoperative time-out was performed.   I advanced a 21 French 0 degree cystoscope through the patient's urethra and into the bladder.  Once I was able to navigate into the patient's bladder I noted a significant amount of blood clot.  There was bleeding from the prosthetic urethra.  I then exchanged the 21 French cystoscope sheath for the 26 French resectoscope sheath using the visual obturator.  I then was  able to irrigate out the patient's clot using the [place] syringe.  Once the clot was cleared I was able to then evaluate the patient's bladder and noted no significant pathologic findings including no tumor.  The bleeding clearly coming from the patient's prosthetic urethra and median lobe.  The median lobe was fulgurated and all the obvious bleeding vessels were fulgurated.  Once the bleeding was stopped the resectoscope was removed and a 24 [place] three-way Foley catheter was placed into the patient's bladder.  I irrigated this easily and started the patient on continuous bladder irrigation.  He subsequently extubated and taken back to his room in stable condition.  Ardis Hughs, M.D.

## 2020-06-30 NOTE — Progress Notes (Signed)
PROGRESS NOTE    Eddie Mendoza  FXT:[id number] DOB: 09-22-1950 DOA: 06/28/2020 PCP: [facility]   Brief Narrative:  70 year old male with past medical history of hypertension, distant tobacco use, recent hospitalization from 4/2-4/4 for hematuria and CBI who presented to the ED with complaints of hematuria with clots and urinary retention x1 day.  He had his catheter removed 6 days ago and completed his bactrim.  At presentation he admitted to the ED provider to generalized fatigue, mild suprapubic discomfort and dysuria, though currently denies any fatigue.  No fever, chills, nausea, vomiting or any other complaints.  Denies any aspirin, Plavix or NSAID use or any other blood thinners. Dr. Claudia Desanctis of urology was consulted and recommended continuous bladder irrigation.  Assessment & Plan:   Principal Problem:   Hematuria Active Problems:   HTN (hypertension)       Acute blood loss anemia   COVID-19 virus infection   Gross hematuria likely secondary to bladder mass Acute symptomatic blood loss anemia, POA - Urology consulted -tentatively planned cystoscopy and clot evacuation, appreciate insight and recommendations  -Bladder mass noted on ultrasound, defer to urology for possible biopsy for further evaluation and treatment -N.p.o. pending procedure  -Hemoglobin downtrending currently 7.1 -1 unit PRBC transfused on admission, second unit was to be transfused today but patient refused -Foley continues to put out red urine with CBI  AKI in the setting of obstruction secondary to above with postoperative blood clots, resolving - Creatinine already back to baseline, continue to follow - Foley continues to put out light red to pink urine as above with CBI - Hold chlorthalidone and losartan until urological procedures can be performed  Hypertension Holding chlorthalidone and losartan As needed hydralazine  Incidental asymptomatic COVID 19 + Cycle threshold 43.2 indicating  this is likely an old/resolving infection or very early infectious process Hold off on any treatment given patient is without hypoxia, respiratory symptoms or chest x-ray findings Continue airborne precautions for 10 days per protocol  DVT prophylaxis: SCDs only given ongoing bleed as above Code Status: Full Family Communication: None present  Status is: Impaired  Dispo: The patient is from: Home              Anticipated d/c is to: Home              Anticipated d/c date is: >53h              Patient currently not medically stable for discharge given ongoing need for urological procedure, ongoing bleeding and close monitoring  Consultants:   Urology, Dr. Claudia Desanctis  Procedures:   Tentatively plan cystoscopy  Antimicrobials:  None indicated  Subjective: Bleeding ongoing overnight, multiple episodes of obstruction requiring flushing.  Objective: Vitals:   06/29/20 0706 06/29/20 1453 06/29/20 2150 06/30/20 0346  BP: (!) 141/44 125/69 138/63 128/60  Pulse: (!) 59 69 72 89  Resp: 17 16 18 17   Temp: 98.5 F (36.9 C) 98.6 F (37 C) 99.1 F (37.3 C) 98.3 F (36.8 C)  TempSrc: Oral Oral Oral Oral  SpO2: 100% 100% 99% 100%  Weight:      Height:        Intake/Output Summary (Last 24 hours) at 06/30/2020 0736 Last data filed at 06/30/2020 0700 Gross per 24 hour  Intake 7710 ml  Output 18350 ml  Net -10640 ml   Filed Weights   06/28/20 1520  Weight: 99.8 kg    Examination:  General:  Pleasantly resting in bed, No acute  distress. HEENT:  Normocephalic atraumatic.  Sclerae nonicteric, noninjected.  Extraocular movements intact bilaterally. Neck:  Without mass or deformity.  Trachea is midline. Lungs:  Clear to auscultate bilaterally without rhonchi, wheeze, or rales. Heart:  Regular rate and rhythm.  Without murmurs, rubs, or gallops. Abdomen:  Soft, minimally tender suprapubic area, nondistended.  Without guarding or rebound. GU: CBI ongoing, Foley putting out red  urine Extremities: Without cyanosis, clubbing, edema, or obvious deformity. Vascular:  Dorsalis pedis and posterior tibial pulses palpable bilaterally. Skin:  Warm and dry, no erythema, no ulcerations.  Data Reviewed: I have personally reviewed following labs and imaging studies  CBC: Recent Labs  Lab 06/28/20 1105 06/28/20 1912 06/29/20 0034 06/30/20 0315  WBC 8.0  --  12.4* 15.7*  NEUTROABS 5.7  --   --   --   HGB 7.4* 8.0* 7.4* 7.1*  HCT 21.9* 24.8* 22.6* 21.3*  MCV 95.6  --  95.8 95.9  PLT 405*  --  369 314   Basic Metabolic Panel: Recent Labs  Lab 06/28/20 1105 06/29/20 0034 06/30/20 0315  NA 139 136 135  K 3.7 3.5 3.6  CL 107 105 104  CO2 24 24 22   GLUCOSE 173* 109* 117*  BUN 16 12 10   CREATININE 1.25* 1.04 0.95  CALCIUM 8.8* 8.5* 8.4*   GFR: Estimated Creatinine Clearance: 100.7 mL/min (by C-G formula based on SCr of 0.95 mg/dL). Liver Function Tests: Recent Labs  Lab 06/28/20 1105  AST 15  ALT 16  ALKPHOS 59  BILITOT 0.4  PROT 6.4*  ALBUMIN 3.1*   No results for input(s): LIPASE, AMYLASE in the last 168 hours. No results for input(s): AMMONIA in the last 168 hours. Coagulation Profile: Recent Labs  Lab 06/28/20 1217  INR 1.0   Cardiac Enzymes: No results for input(s): CKTOTAL, CKMB, CKMBINDEX, TROPONINI in the last 168 hours. BNP (last 3 results) No results for input(s): PROBNP in the last 8760 hours. HbA1C: No results for input(s): HGBA1C in the last 72 hours. CBG: No results for input(s): GLUCAP in the last 168 hours. Lipid Profile: No results for input(s): CHOL, HDL, LDLCALC, TRIG, CHOLHDL, LDLDIRECT in the last 72 hours. Thyroid Function Tests: No results for input(s): TSH, T4TOTAL, FREET4, T3FREE, THYROIDAB in the last 72 hours. Anemia Panel: No results for input(s): VITAMINB12, FOLATE, FERRITIN, TIBC, IRON, RETICCTPCT in the last 72 hours. Sepsis Labs: No results for input(s): PROCALCITON, LATICACIDVEN in the last 168  hours.  Recent Results (from the past 240 hour(s))  SARS CORONAVIRUS 2 (TAT 6-24 HRS) Nasopharyngeal Nasopharyngeal Swab     Status: None   Collection Time: 06/20/20  5:26 PM   Specimen: Nasopharyngeal Swab  Result Value Ref Range Status   SARS Coronavirus 2 NEGATIVE NEGATIVE Final    Comment: (NOTE) SARS-CoV-2 target nucleic acids are NOT DETECTED.  The SARS-CoV-2 RNA is generally detectable in upper and lower respiratory specimens during the acute phase of infection. Negative results do not preclude SARS-CoV-2 infection, do not rule out co-infections with other pathogens, and should not be used as the sole basis for treatment or other patient management decisions. Negative results must be combined with clinical observations, patient history, and epidemiological information. The expected result is Negative.  Fact Sheet for Patients: SugarRoll.be  Fact Sheet for Healthcare Providers: https://www.woods-mathews.com/  This test is not yet approved or cleared by the [place] FDA and  has been authorized for detection and/or diagnosis of SARS-CoV-2 by FDA under an Emergency Use Authorization (EUA). This EUA  will remain  in effect (meaning this test can be used) for the duration of the COVID-19 declaration under Se ction 564(b)(1) of the Act, 21 U.S.C. section 360bbb-3(b)(1), unless the authorization is terminated or revoked sooner.  Performed at [facility], [place] [address], [place], [place] [address]   Urine Culture     Status: None   Collection Time: 06/20/20  8:53 PM   Specimen: Urine, Catheterized  Result Value Ref Range Status   Specimen Description   Final    URINE, CATHETERIZED Performed at [facility], [address], [place], [place] [address]    Special Requests   Final    NONE Performed at [facility], [address], [place], [place] [address]    Culture   Final    NO GROWTH Performed at [place], [place] [address] [place], [place] [address]    Report Status 06/22/2020 FINAL  Final  Urine culture     Status: None   Collection Time: 06/28/20 11:06 AM   Specimen: Urine, Clean Catch  Result Value Ref Range Status   Specimen Description   Final    URINE, CLEAN CATCH Performed at [facility], [place] [address] [place], [place] [address]    Special Requests   Final    NONE Performed at [facility], [place] [address] [place], [place] [address]    Culture   Final    NO GROWTH Performed at [facility], [place] [address] [place],  [address]    Report Status 06/29/2020 FINAL  Final  Resp Panel by RT-PCR (Flu A&B, Covid) Nasopharyngeal Swab     Status: Abnormal   Collection Time: 06/28/20  1:22 PM   Specimen: Nasopharyngeal Swab; Nasopharyngeal(NP) swabs in vial transport medium  Result Value Ref Range Status   SARS Coronavirus 2 by RT PCR POSITIVE (A) NEGATIVE Final    Comment: RESULT CALLED TO, READ BACK BY AND VERIFIED WITH: STISKA,J RN @1606  ON 06/28/20 JACKSON,K (NOTE) SARS-CoV-2 target nucleic acids are DETECTED.  The SARS-CoV-2 RNA is generally detectable in upper respiratory specimens during the acute phase of infection. Positive results are indicative of the presence of the identified virus, but do not rule out bacterial infection or co-infection with other pathogens not detected by the test. Clinical correlation with patient history and other diagnostic information is necessary to determine patient infection status. The expected result is Negative.  Fact Sheet for Patients: EntrepreneurPulse.com.au  Fact Sheet for Healthcare Providers: IncredibleEmployment.be  This test is not yet approved or cleared by the [place] FDA and  has been authorized for detection and/or diagnosis of SARS-CoV-2 by FDA under an Emergency Use Authorization (EUA).  This EUA will remain in effect (meaning  this test c an be used) for the duration of  the COVID-19 declaration under Section 564(b)(1) of the Act, 21 U.S.C. section 360bbb-3(b)(1), unless the authorization is terminated or revoked sooner.     Influenza A by PCR NEGATIVE NEGATIVE Final   Influenza B by PCR NEGATIVE NEGATIVE Final    Comment: (NOTE) The Xpert Xpress SARS-CoV-2/FLU/RSV plus assay is intended as an aid in the diagnosis of influenza from Nasopharyngeal swab specimens and should not be used as a sole basis for treatment. Nasal washings and aspirates are unacceptable for Xpert Xpress SARS-CoV-2/FLU/RSV testing.  Fact Sheet for Patients: EntrepreneurPulse.com.au  Fact Sheet for Healthcare Providers: IncredibleEmployment.be  This test is not yet approved or cleared by the [place] FDA  and has been authorized for detection and/or diagnosis of SARS-CoV-2 by FDA under an Emergency Use Authorization (EUA). This EUA will remain in effect (meaning this test can be used) for the duration of the COVID-19 declaration under Section 564(b)(1) of the Act, 21 U.S.C. section 360bbb-3(b)(1), unless the authorization is terminated or revoked.  Performed at [facility], [place] [address] [place], [place] [address]      Radiology Studies: No results found.  Scheduled Meds: . Chlorhexidine Gluconate Cloth  6 each Topical Daily  . finasteride  5 mg Oral Daily   Continuous Infusions: . sodium chloride irrigation       LOS: 2 days   Time spent: 67min  Brittinee Risk C Srah Ake, DO [facility]  If 7PM-7AM, please contact night-coverage [email or web address]  06/30/2020, 7:36 AM

## 2020-06-30 NOTE — Progress Notes (Signed)
The pelvic US shows residual clot and possible neoplasm in the bladder.   He continues to have difficulty with clots obstructing the catheter.   I have spoken with Dr. Claudia Desanctis and she will try to get him down to the OR for cystoscopy with clot evaculation and possible TURBT this afternoon and Dr. Louis Meckel, the on call urologist, is aware should the case be delayed.  I have reviewed the risks of the procedure including bleeding, infection, bladder wall and urethral injury, incontinence, stricture, thrombotic events and anesthetic complications.     His Hgb is down to 7.1 from 11.7 on 06/20/20.   He would benefit from at least a unit of PRBC's prior to the procedure.

## 2020-06-30 NOTE — Progress Notes (Signed)
Approximately 0345 pt called out, RN immediately went into room, Pt stating he had to pee.  Pt urinating around the foley, bloody fluid backing up into the irrigation line.  Manually irrigated with 47mL felt something break loose, but no clots visible in drainage bag.  Educated the patient that if he had the slightest urge to urinate to call me immediately as this is an abnormal sensation given he has a foley and if it is draining properly he should not feel any urges to urinate. Pt verbalizes understanding but feels that CBI is not an appropriate course of treatment for clot evacuation.

## 2020-06-30 NOTE — Progress Notes (Signed)
Subjective: [name] had some increased difficulty this morning and additional clot evacuation was needed.  He has a pelvic US ordered to assess clot burden today.  His Hgb is down to 7.1.  ROS:  Review of Systems  Genitourinary: Negative.        Foley irritation    Anti-infectives: Anti-infectives (From admission, onward)   None      Current Facility-Administered Medications  Medication Dose Route Frequency Provider Last Rate Last Admin  . acetaminophen (TYLENOL) tablet 650 mg  650 mg Oral Q6H PRN Harold Hedge, MD       Or  . acetaminophen (TYLENOL) suppository 650 mg  650 mg Rectal Q6H PRN Harold Hedge, MD      . belladonna-opium (B&O) suppository 16.2-60mg   1 suppository Rectal Q8H PRN Lucas Mallow, MD   1 suppository at 06/29/20 0048  . Chlorhexidine Gluconate Cloth 2 % PADS 6 each  6 each Topical Daily Little Ishikawa, MD   6 each at 06/29/20 1101  . finasteride (PROSCAR) tablet 5 mg  5 mg Oral Daily Eddie Seal, MD   5 mg at 06/29/20 1948  . hydrALAZINE (APRESOLINE) injection 5 mg  5 mg Intravenous Q8H PRN Harold Hedge, MD      . morphine 2 MG/ML injection 2 mg  2 mg Intravenous Q4H PRN Harold Hedge, MD   2 mg at 06/29/20 1132  . polyethylene glycol (MIRALAX / GLYCOLAX) packet 17 g  17 g Oral Daily PRN Harold Hedge, MD      . sodium chloride irrigation 0.9 % 3,000 mL  3,000 mL Irrigation Continuous Harold Hedge, MD   3,000 mL at 06/29/20 1819     Objective: Vital signs in last 24 hours: Temp:  [98.3 F (36.8 C)-99.1 F (37.3 C)] 98.3 F (36.8 C) (04/12 0346) Pulse Rate:  [69-89] 89 (04/12 0346) Resp:  [16-18] 17 (04/12 0346) BP: (125-138)/(60-69) 128/60 (04/12 0346) SpO2:  [99 %-100 %] 100 % (04/12 0346)  Intake/Output from previous day: 04/11 0701 - 04/12 0700 In: 7710  Out: [place] [Urine:19450] Intake/Output this shift: No intake/output data recorded.   Physical Exam Vitals reviewed.  Constitutional:      Appearance: Normal  appearance.  Abdominal:     General: Abdomen is flat.     Palpations: There is no mass.     Tenderness: There is no abdominal tenderness.  Neurological:     Mental Status: He is alert.     Lab Results:  Recent Labs    06/29/20 0034 06/30/20 0315  WBC 12.4* 15.7*  HGB 7.4* 7.1*  HCT 22.6* 21.3*  PLT 369 396   BMET Recent Labs    06/29/20 0034 06/30/20 0315  NA 136 135  K 3.5 3.6  CL 105 104  CO2 24 22  GLUCOSE 109* 117*  BUN 12 10  CREATININE 1.04 0.95  CALCIUM 8.5* 8.4*   PT/INR Recent Labs    06/28/20 1217  LABPROT 12.8  INR 1.0   ABG No results for input(s): PHART, HCO3 in the last 72 hours.  Invalid input(s): PCO2, PO2  Studies/Results: No results found.   Assessment and Plan: BPH with clot retention.  He continues to have bloody urine with clots requiring irrigation.   I will make him NPO again and if the pelvic US shows a large residual clot burden, he will need to go the OR for cystoscopy and clot evacuation.     ABL.  His Hgb is down to 7.1.   Consider transfusion.     LOS: 2 days    Eddie Mendoza 06/30/2020 [phone] ID: Eddie Mendoza, male   DOB: [birth year], 70 y.o.   MRN: [id number]

## 2020-06-30 NOTE — Anesthesia Procedure Notes (Signed)
Procedure Name: LMA Insertion Date/Time: 06/30/2020 8:11 PM Performed by: Barret Esquivel D, CRNA Pre-anesthesia Checklist: Patient identified, Emergency Drugs available, Suction available and Patient being monitored Patient Re-evaluated:Patient Re-evaluated prior to induction Oxygen Delivery Method: Circle system utilized Preoxygenation: Pre-oxygenation with 100% oxygen Induction Type: IV induction Ventilation: Mask ventilation without difficulty LMA: LMA inserted LMA Size: 5.0 Tube type: Oral Number of attempts: 1 Placement Confirmation: positive ETCO2 and breath sounds checked- equal and bilateral Tube secured with: Tape Dental Injury: Teeth and Oropharynx as per pre-operative assessment

## 2020-06-30 NOTE — Interval H&P Note (Signed)
History and Physical Interval Note:  06/30/2020 6:56 PM  Eddie Mendoza  has presented today for surgery, with the diagnosis of Clot Evacuation.  The various methods of treatment have been discussed with the patient and family. After consideration of risks, benefits and other options for treatment, the patient has consented to  Procedure(s): TRANSURETHRAL RESECTION OF BLADDER TUMOR (TURBT)CLOT EVACUATION (N/A) as a surgical intervention.  The patient's history has been reviewed, patient examined, no change in status, stable for surgery.  I have reviewed the patient's chart and labs.  Questions were answered to the patient's satisfaction.     Ardis Hughs

## 2020-06-30 NOTE — Anesthesia Postprocedure Evaluation (Signed)
Anesthesia Post Note  Patient: Eddie Mendoza  Procedure(s) Performed:  [place] Free OF BLEEDING, CLOT EVACUATION (N/A Bladder)     Patient location during evaluation: PACU Anesthesia Type: General Level of consciousness: awake and alert Pain management: pain level controlled Vital Signs Assessment: post-procedure vital signs reviewed and stable Respiratory status: spontaneous breathing, nonlabored ventilation, respiratory function stable and patient connected to nasal cannula oxygen Cardiovascular status: blood pressure returned to baseline and stable Postop Assessment: no apparent nausea or vomiting Anesthetic complications: no   No complications documented.  Last Vitals:  Vitals:   06/30/20 1511 06/30/20 2130  BP: 134/60 (!) 155/83  Pulse: 73 76  Resp: 16 (!) 7  Temp: 37 C 36.8 C  SpO2: 100% 98%    Last Pain:  Vitals:   06/30/20 2130  TempSrc:   PainSc: 0-No pain                 Hazely Sealey

## 2020-06-30 NOTE — Progress Notes (Signed)
Pt refusing blood administration at this time. Dr. Avon Gully and Saxon notified.

## 2020-06-30 NOTE — Anesthesia Preprocedure Evaluation (Addendum)
Anesthesia Evaluation  Patient identified by MRN, date of birth, ID band Patient awake    Reviewed: Allergy & Precautions, H&P , NPO status , Patient's Chart, lab work & pertinent test results, reviewed documented beta blocker date and time   Airway Mallampati: I  TM Distance: >3 FB Neck ROM: full    Dental no notable dental hx. (+) Teeth Intact, Dental Advisory Given   Pulmonary Current Smoker,    Pulmonary exam normal breath sounds clear to auscultation       Cardiovascular Exercise Tolerance: Good hypertension, Pt. on medications  Rhythm:regular Rate:Normal     Neuro/Psych    GI/Hepatic   Endo/Other    Renal/GU      Musculoskeletal   Abdominal   Peds  Hematology  (+) Blood dyscrasia, anemia ,   Anesthesia Other Findings   Reproductive/Obstetrics                            Anesthesia Physical Anesthesia Plan  ASA: III and emergent  Anesthesia Plan: General   Post-op Pain Management:    Induction:   PONV Risk Score and Plan: 2 and Ondansetron, Dexamethasone and Treatment may vary due to age or medical condition  Airway Management Planned: LMA  Additional Equipment:   Intra-op Plan:   Post-operative Plan:   Informed Consent: I have reviewed the patients History and Physical, chart, labs and discussed the procedure including the risks, benefits and alternatives for the proposed anesthesia with the patient or authorized representative who has indicated his/her understanding and acceptance.     Dental Advisory Given  Plan Discussed with: CRNA and Anesthesiologist  Anesthesia Plan Comments:        Anesthesia Quick Evaluation

## 2020-06-30 NOTE — Transfer of Care (Signed)
Immediate Anesthesia Transfer of Care Note  Patient: Eddie Mendoza  Procedure(s) Performed:  Al[place] Free OF BLEEDING, CLOT EVACUATION (N/A Bladder)  Patient Location: PACU  Anesthesia Type:General  Level of Consciousness: awake, alert  and oriented  Airway & Oxygen Therapy: Patient Spontanous Breathing and Patient connected to face mask oxygen  Post-op Assessment: Report given to RN and Post -op Vital signs reviewed and stable  Post vital signs: Reviewed and stable  Last Vitals:  Vitals Value Taken Time  BP 155/83 06/30/20 2131  Temp    Pulse 74 06/30/20 2132  Resp 25 06/30/20 2132  SpO2 100 % 06/30/20 2132  Vitals shown include unvalidated device data.  Last Pain:  Vitals:   06/30/20 1914  TempSrc:   PainSc: 5       Patients Stated Pain Goal: 0 (92/95/74 7340)  Complications: No complications documented.

## 2020-06-30 NOTE — H&P (View-Only) (Signed)
Subjective: Eddie Mendoza had some increased difficulty this morning and additional clot evacuation was needed.  He has a pelvic US ordered to assess clot burden today.  His Hgb is down to 7.1.  ROS:  Review of Systems  Genitourinary: Negative.        Foley irritation    Anti-infectives: Anti-infectives (From admission, onward)   None      Current Facility-Administered Medications  Medication Dose Route Frequency Provider Last Rate Last Admin  . acetaminophen (TYLENOL) tablet 650 mg  650 mg Oral Q6H PRN Harold Hedge, MD       Or  . acetaminophen (TYLENOL) suppository 650 mg  650 mg Rectal Q6H PRN Harold Hedge, MD      . belladonna-opium (B&O) suppository 16.2-60mg   1 suppository Rectal Q8H PRN Lucas Mallow, MD   1 suppository at 06/29/20 0048  . Chlorhexidine Gluconate Cloth 2 % PADS 6 each  6 each Topical Daily Little Ishikawa, MD   6 each at 06/29/20 1101  . finasteride (PROSCAR) tablet 5 mg  5 mg Oral Daily Irine Seal, MD   5 mg at 06/29/20 1948  . hydrALAZINE (APRESOLINE) injection 5 mg  5 mg Intravenous Q8H PRN Harold Hedge, MD      . morphine 2 MG/ML injection 2 mg  2 mg Intravenous Q4H PRN Harold Hedge, MD   2 mg at 06/29/20 1132  . polyethylene glycol (MIRALAX / GLYCOLAX) packet 17 g  17 g Oral Daily PRN Harold Hedge, MD      . sodium chloride irrigation 0.9 % 3,000 mL  3,000 mL Irrigation Continuous Harold Hedge, MD   3,000 mL at 06/29/20 1819     Objective: Vital signs in last 24 hours: Temp:  [98.3 F (36.8 C)-99.1 F (37.3 C)] 98.3 F (36.8 C) (04/12 0346) Pulse Rate:  [69-89] 89 (04/12 0346) Resp:  [16-18] 17 (04/12 0346) BP: (125-138)/(60-69) 128/60 (04/12 0346) SpO2:  [99 %-100 %] 100 % (04/12 0346)  Intake/Output from previous day: 04/11 0701 - 04/12 0700 In: 7710  Out: [place] [Urine:19450] Intake/Output this shift: No intake/output data recorded.   Physical Exam Vitals reviewed.  Constitutional:      Appearance: Normal  appearance.  Abdominal:     General: Abdomen is flat.     Palpations: There is no mass.     Tenderness: There is no abdominal tenderness.  Neurological:     Mental Status: He is alert.     Lab Results:  Recent Labs    06/29/20 0034 06/30/20 0315  WBC 12.4* 15.7*  HGB 7.4* 7.1*  HCT 22.6* 21.3*  PLT 369 396   BMET Recent Labs    06/29/20 0034 06/30/20 0315  NA 136 135  K 3.5 3.6  CL 105 104  CO2 24 22  GLUCOSE 109* 117*  BUN 12 10  CREATININE 1.04 0.95  CALCIUM 8.5* 8.4*   PT/INR Recent Labs    06/28/20 1217  LABPROT 12.8  INR 1.0   ABG No results for input(s): PHART, HCO3 in the last 72 hours.  Invalid input(s): PCO2, PO2  Studies/Results: No results found.   Assessment and Plan: BPH with clot retention.  He continues to have bloody urine with clots requiring irrigation.   I will make him NPO again and if the pelvic US shows a large residual clot burden, he will need to go the OR for cystoscopy and clot evacuation.     ABL.  His Hgb is down to 7.1.   Consider transfusion.     LOS: 2 days    Irine Seal 06/30/2020 [phone] ID: Eddie Mendoza, male   DOB: [birth year], 70 y.o.   MRN: [id number]

## 2020-07-01 DIAGNOSIS — U071 COVID-19: Secondary | ICD-10-CM

## 2020-07-01 DIAGNOSIS — D62 Acute posthemorrhagic anemia: Secondary | ICD-10-CM

## 2020-07-01 DIAGNOSIS — I10 Essential (primary) hypertension: Secondary | ICD-10-CM

## 2020-07-01 DIAGNOSIS — R7989 Other specified abnormal findings of blood chemistry: Secondary | ICD-10-CM

## 2020-07-01 DIAGNOSIS — R319 Hematuria, unspecified: Secondary | ICD-10-CM

## 2020-07-01 LAB — BPAM RBC
Blood Product Expiration Date: 202204292359
Blood Product Expiration Date: 202204302359
ISSUE DATE / TIME: 202204101551
ISSUE DATE / TIME: 202204121231
Unit Type and Rh: 6200
Unit Type and Rh: 6200

## 2020-07-01 LAB — CBC
HCT: 25.3 % — ABNORMAL LOW (ref 39.0–52.0)
Hemoglobin: 8.4 g/dL — ABNORMAL LOW (ref 13.0–17.0)
MCH: 31.9 pg (ref 26.0–34.0)
MCHC: 33.2 g/dL (ref 30.0–36.0)
MCV: 96.2 fL (ref 80.0–100.0)
Platelets: 430 10*3/uL — ABNORMAL HIGH (ref 150–400)
RBC: 2.63 MIL/uL — ABNORMAL LOW (ref 4.22–5.81)
RDW: 13.4 % (ref 11.5–15.5)
WBC: 19.7 10*3/uL — ABNORMAL HIGH (ref 4.0–10.5)
nRBC: 0 % (ref 0.0–0.2)

## 2020-07-01 LAB — TYPE AND SCREEN
ABO/RH(D): A POS
Antibody Screen: NEGATIVE
Unit division: 0
Unit division: 0

## 2020-07-01 LAB — BASIC METABOLIC PANEL
Anion gap: 10 (ref 5–15)
BUN: 10 mg/dL (ref 8–23)
CO2: 23 mmol/L (ref 22–32)
Calcium: 8.6 mg/dL — ABNORMAL LOW (ref 8.9–10.3)
Chloride: 106 mmol/L (ref 98–111)
Creatinine, Ser: 0.86 mg/dL (ref 0.61–1.24)
GFR, Estimated: >60 mL/min (ref >60)
Glucose, Bld: 144 mg/dL — ABNORMAL HIGH (ref 70–99)
Potassium: 4.2 mmol/L (ref 3.5–5.1)
Sodium: 139 mmol/L (ref 135–145)

## 2020-07-01 MED ORDER — TAMSULOSIN HCL 0.4 MG PO CAPS: 0.4000 mg | ORAL_CAPSULE | Freq: Every day | ORAL | 0 refills | Status: DC

## 2020-07-01 MED ORDER — BISACODYL 10 MG RE SUPP
10.0000 mg | Freq: Once | RECTAL | Status: DC
  Filled 2020-07-01: qty 1

## 2020-07-01 MED ORDER — DOCUSATE SODIUM 100 MG PO CAPS
100.0000 mg | ORAL_CAPSULE | Freq: Two times a day (BID) | ORAL | Status: DC
  Administered 2020-07-01: 100 mg via ORAL
  Filled 2020-07-01: qty 1

## 2020-07-01 MED ORDER — MUPIROCIN 2 % EX OINT
1.0000 "application " | TOPICAL_OINTMENT | Freq: Two times a day (BID) | CUTANEOUS | Status: DC
  Administered 2020-07-01 (×2): 1 via NASAL
  Filled 2020-07-01: qty 22

## 2020-07-01 MED ORDER — CHLORHEXIDINE GLUCONATE CLOTH 2 % EX PADS
6.0000 | MEDICATED_PAD | Freq: Every day | CUTANEOUS | Status: DC
  Administered 2020-07-01: 6 via TOPICAL

## 2020-07-01 MED ORDER — FINASTERIDE 5 MG PO TABS: 5.0000 mg | ORAL_TABLET | Freq: Every day | ORAL | 0 refills | Status: DC

## 2020-07-01 MED ORDER — MAGNESIUM CITRATE PO SOLN
1.0000 | Freq: Once | ORAL | Status: AC
  Administered 2020-07-01: 1 via ORAL
  Filled 2020-07-01: qty 296

## 2020-07-01 NOTE — Progress Notes (Signed)
Patient discharged to home w/ wife. Given all belongings, instructions. Verbalized understanding of instructions and follow up.

## 2020-07-01 NOTE — Discharge Summary (Signed)
Physician Discharge Summary  Eddie Mendoza WCB:[id number] DOB: 1950/10/03 DOA: 06/28/2020  PCP: [facility]  Admit date: 06/28/2020 Discharge date: 07/01/2020  Admitted From: Home Disposition: Home   Recommendations for Outpatient Follow-up:  1. Follow up with PCP with repeat CBC in 1-2 weeks.  2. Follow up with urology, Dr. Louis Meckel, in the next week. Started on flomax and finasteride.  Home Health: None Equipment/Devices: None Discharge Condition: Stable CODE STATUS: Full Diet recommendation: Heart healthy  Brief/Interim Summary: Eddie Mendoza is a 70yo M with a history of HTN, remote tobacco use, recent hospitalization from 4/2-4/4 for hematuria and CBI who presented to the ED 4/10 with complaints of hematuria with clots and urinary retention x1 day. Dr. Claudia Desanctis of urology was consulted and recommended CBI, ultimately found to have mass like appearance in the bladder on ultrasound. Cystoscopy was performed 4/12 by Dr. Louis Meckel revealing a very large prostate, heavily trabeculated bladder but there was no discrete tumors or other significant pathologic findings within the bladder itself.  Most of the patient's bleeding was coming from the patient's median lobe and intravesical prostate. This was fulgurated and evacuated the patient's clots which seemed to resolve the bleeding. He required 1u PRBCs and hgb has stabilized with clinical improvement in bleeding. Foley has been removed and the patient is voiding spontaneously with clearing of serial urine specimens. I discussed this with urology, Dr. Louis Meckel, who feels the patient is safe to go home on flomax and finasteride with plans to follow up in his office next week.  Discharge Diagnoses:  Principal Problem:   Hematuria Active Problems:   HTN (hypertension)       Acute blood loss anemia   COVID-19 virus infection  Gross hematuria likely secondary to large prostate s/p fulguration on cystoscopy 4/12. No bladder mass  noted. Acute symptomatic blood loss anemia, POA: Hgb 7.1 > 8.4 at discharge s/p 1u PRBCs.   AKI in the setting of obstruction secondary to above with postoperative blood clots, resolved  Hypertension: Continue home medications  Incidental asymptomatic covid-19 infection:  Cycle threshold 43.2 indicating this is likely an old/resolving infection. He has no attributable symptoms.   - Continue airborne precautions for 10 days   Leukocytosis: No fever or nidus of infection noted. Had just completed course of bactrim. Urine culture negative.   Discharge Instructions Discharge Instructions    Discharge instructions   Complete by: As directed    Follow up with urology, Dr. Louis Meckel as directed or seek medical attention urgently if bleeding returns.     Allergies as of 07/01/2020   No Known Allergies     Medication List    STOP taking these medications   chlorthalidone 25 MG tablet Commonly known as: HYGROTON   colchicine 0.6 MG tablet   losartan 25 MG tablet Commonly known as: COZAAR   nicotine polacrilex 2 MG lozenge Commonly known as: COMMIT   sulfamethoxazole-trimethoprim 800-160 MG tablet Commonly known as: BACTRIM DS     TAKE these medications   finasteride 5 MG tablet Commonly known as: PROSCAR Take 1 tablet (5 mg total) by mouth daily. Start taking on: July 02, 2020   lidocaine 5 % ointment Commonly known as: XYLOCAINE Apply 1 application topically 4 (four) times daily as needed for mild pain or moderate pain.   polyethylene glycol 17 g packet Commonly known as: MIRALAX / GLYCOLAX Take 17 g by mouth daily as needed for mild constipation.   tamsulosin 0.4 MG Caps capsule Commonly known as: Flomax  Take 1 capsule (0.4 mg total) by mouth daily.       Follow-up Information    [facility] Follow up.   Contact information: [place] [address] [phone]        Ardis Hughs, MD Follow up.    Specialty: Urology Contact information: [place] [address] [phone]              No Known Allergies  Consultations:  Urology  Procedures/Studies: US PELVIS LIMITED (TRANSABDOMINAL ONLY)  Result Date: 06/30/2020 CLINICAL DATA:  Gross hematuria EXAM: LIMITED ULTRASOUND OF PELVIS TECHNIQUE: Limited transabdominal ultrasound examination of the pelvis/urinary bladder was performed. COMPARISON:  CT abdomen and pelvis 06/20/2020 FINDINGS: Mild diffuse bladder wall thickening. Large mass/filling defect identified within the bladder lumen measuring 10.2 x 7.5 by 9.4 cm. Mass is heterogeneous raising from slightly hyperechoic to hypoechoic, macrolobulated, with a few foci of interspersed fluid. A few brightly echogenic foci are seen which could represent calcification or air. Portions of the mass demonstrate internal blood flow on color Doppler imaging, while other portions do not. Finding likely represents a combination of tumor and clot within the urinary bladder. IMPRESSION: 10.2 x 7.5 x 9.4 cm diameter mass/filling defect within bladder lumen, portions of which demonstrate internal blood flow on color Doppler imaging, favor a combination of tumor and clot. This could represent bladder tumor or less likely prostate tumor, unlikely prostatic enlargement. Electronically Signed   By: Lavonia Dana M.D.   On: 06/30/2020 08:42   CT ABDOMEN PELVIS W CONTRAST  Result Date: 06/20/2020 CLINICAL DATA:  Groin pain, bladder hemorrhage. EXAM: CT ABDOMEN AND PELVIS WITH CONTRAST TECHNIQUE: Multidetector CT imaging of the abdomen and pelvis was performed using the standard protocol following bolus administration of intravenous contrast. CONTRAST:  158mL OMNIPAQUE IOHEXOL 300 MG/ML  SOLN COMPARISON:  None available FINDINGS: Lower chest: Lung bases are clear. Hepatobiliary: No focal hepatic lesion. There is thickening of the gallbladder wall through the neck of the gallbladder over a 3 cm  segment (image 28/series 2) potential non radiodense 9 calculus within the lumen of the gallbladder. No evidence acute cholecystitis. Common bile duct normal caliber Pancreas: Pancreas is normal. No ductal dilatation. No pancreatic inflammation. Spleen: Normal spleen Adrenals/urinary tract: Adrenal glands normal. No enhancing renal cortical lesion. Mild bilateral hydroureter. No obstructing lesion identified. There is a Foley catheter within the lumen of the bladder. The Foley catheter is position above a markedly enlarged prostate gland. There is a large volume of high-density fluid within the lumen of the bladder consistent with hematoma/hemorrhage. The bladder is distended measuring 14.8 x 9.4 by 11.4 cm (volume = 830 cm^3). The entirety of the lumen of the distended bladder is near completely filled with blood product. Small amount a gas along the dome of the bladder. Several small diverticula at this level. Stomach/Bowel: Stomach, small bowel, appendix, and cecum are normal. The colon and rectosigmoid colon are normal. Vascular/Lymphatic: Abdominal aorta is normal caliber with atherosclerotic calcification. There is no retroperitoneal or periportal lymphadenopathy. No pelvic lymphadenopathy. Reproductive: Enlarged prostate gland measures 7.5 by 8.0 by 5.8 cm (volume = 180 cm^3) Other: No free fluid. Musculoskeletal: Degenerative osteophytosis of the spine. IMPRESSION: 1. The lumen of the distended bladder is near completely filled with blood product. Bladder distended to a volume of 830 cubic cm. Foley catheter within lumen of the bladder position above and enlarged prostate gland. 2. Mural thickening through the neck of the gallbladder. Potential non  radiodense stones within the fundus of the gallbladder. Mural thickening may represent benign collapse the bladder; however, cannot exclude a mucosal lesion or neoplasm. Recommend contrast MRI of the gallbladder on a nonemergent basis. Electronically Signed   By:  Suzy Bouchard M.D.   On: 06/20/2020 11:34     Cystoscopy, clot evacuation and fulguration 06/30/2020 Dr. Louis Meckel. #1: The patient has a very large prostate with a very high median bar and intravesical median lobe.  The patient's bladder was very heavily trabeculated but there was no discrete tumors or other significant pathologic findings within the bladder itself.  Most of the patient's bleeding was coming from the patient's median lobe and intravesical prostate.  I fulgurated all of this and evacuated the patient's clots which seemed to resolve the bleeding.  Subjective: Dressed in street clothes requesting discharge ASAP. States his urine is now clear and he's voiding normally after discontinuing foley. No clots. No abd/suprapubic discomfort.   Discharge Exam: Vitals:   07/01/20 0639 07/01/20 1012  BP: (!) 129/54 (!) 134/58  Pulse: 64 69  Resp: 16 20  Temp: 98 F (36.7 C) 98.3 F (36.8 C)  SpO2: 99% 100%   General: Pt is alert, awake, not in acute distress Cardiovascular: RRR, S1/S2 +, no rubs, no gallops Respiratory: CTA bilaterally, no wheezing, no rhonchi Abdominal: Soft, NT, ND, bowel sounds + Extremities: No edema, no cyanosis  Labs: BNP (last 3 results) No results for input(s): BNP in the last 8760 hours. Basic Metabolic Panel: Recent Labs  Lab 06/28/20 1105 06/29/20 0034 06/30/20 0315 07/01/20 0400  NA 139 136 135 139  K 3.7 3.5 3.6 4.2  CL 107 105 104 106  CO2 24 24 22 23   GLUCOSE 173* 109* 117* 144*  BUN 16 12 10 10   CREATININE 1.25* 1.04 0.95 0.86  CALCIUM 8.8* 8.5* 8.4* 8.6*   Liver Function Tests: Recent Labs  Lab 06/28/20 1105  AST 15  ALT 16  ALKPHOS 59  BILITOT 0.4  PROT 6.4*  ALBUMIN 3.1*   No results for input(s): LIPASE, AMYLASE in the last 168 hours. No results for input(s): AMMONIA in the last 168 hours. CBC: Recent Labs  Lab 06/28/20 1105 06/28/20 1912 06/29/20 0034 06/30/20 0315 07/01/20 0400  WBC 8.0  --  12.4* 15.7* 19.7*   NEUTROABS 5.7  --   --   --   --   HGB 7.4* 8.0* 7.4* 7.1* 8.4*  HCT 21.9* 24.8* 22.6* 21.3* 25.3*  MCV 95.6  --  95.8 95.9 96.2  PLT 405*  --  369 396 430*   Cardiac Enzymes: No results for input(s): CKTOTAL, CKMB, CKMBINDEX, TROPONINI in the last 168 hours. BNP: Invalid input(s): POCBNP CBG: No results for input(s): GLUCAP in the last 168 hours. D-Dimer No results for input(s): DDIMER in the last 72 hours. Hgb A1c No results for input(s): HGBA1C in the last 72 hours. Lipid Profile No results for input(s): CHOL, HDL, LDLCALC, TRIG, CHOLHDL, LDLDIRECT in the last 72 hours. Thyroid function studies No results for input(s): TSH, T4TOTAL, T3FREE, THYROIDAB in the last 72 hours.  Invalid input(s): FREET3 Anemia work up No results for input(s): VITAMINB12, FOLATE, FERRITIN, TIBC, IRON, RETICCTPCT in the last 72 hours. Urinalysis    Component Value Date/Time   COLORURINE RED (A) 06/28/2020 1105   APPEARANCEUR TURBID (A) 06/28/2020 1105   LABSPEC  06/28/2020 1105    TEST NOT REPORTED DUE TO COLOR INTERFERENCE OF URINE PIGMENT   PHURINE  06/28/2020 1105  TEST NOT REPORTED DUE TO COLOR INTERFERENCE OF URINE PIGMENT   GLUCOSEU (A) 06/28/2020 1105    TEST NOT REPORTED DUE TO COLOR INTERFERENCE OF URINE PIGMENT   HGBUR (A) 06/28/2020 1105    TEST NOT REPORTED DUE TO COLOR INTERFERENCE OF URINE PIGMENT   BILIRUBINUR (A) 06/28/2020 1105    TEST NOT REPORTED DUE TO COLOR INTERFERENCE OF URINE PIGMENT   KETONESUR (A) 06/28/2020 1105    TEST NOT REPORTED DUE TO COLOR INTERFERENCE OF URINE PIGMENT   PROTEINUR (A) 06/28/2020 1105    TEST NOT REPORTED DUE TO COLOR INTERFERENCE OF URINE PIGMENT   UROBILINOGEN 0.2 08/15/2009 1010   NITRITE (A) 06/28/2020 1105    TEST NOT REPORTED DUE TO COLOR INTERFERENCE OF URINE PIGMENT   LEUKOCYTESUR (A) 06/28/2020 1105    TEST NOT REPORTED DUE TO COLOR INTERFERENCE OF URINE PIGMENT    Microbiology Recent Results (from the past 240 hour(s))   Urine culture     Status: None   Collection Time: 06/28/20 11:06 AM   Specimen: Urine, Clean Catch  Result Value Ref Range Status   Specimen Description   Final    URINE, CLEAN CATCH Performed at [facility], [place] [address] [place], [place] [address]    Special Requests   Final    NONE Performed at [facility], [place] [address] [place], [place] [address]    Culture   Final    NO GROWTH Performed at [facility], [place] [address], [employer] [place] [employer], [place] [address]    Report Status 06/29/2020 FINAL  Final  Resp Panel by RT-PCR (Flu A&B, Covid) Nasopharyngeal Swab     Status: Abnormal   Collection Time: 06/28/20  1:22 PM   Specimen: Nasopharyngeal Swab; Nasopharyngeal(NP) swabs in vial transport medium  Result Value Ref Range Status   SARS Coronavirus 2 by RT PCR POSITIVE (A) NEGATIVE Final    Comment: RESULT CALLED TO, READ BACK BY AND VERIFIED WITH: STISKA,J RN @1606  ON 06/28/20 JACKSON,K (NOTE) SARS-CoV-2 target nucleic acids are DETECTED.  The SARS-CoV-2 RNA is generally detectable in upper respiratory specimens during the acute phase of infection. Positive results are indicative of the presence of the identified virus, but do not rule out bacterial infection or co-infection with other pathogens not detected by the test. Clinical correlation with patient history and other diagnostic information is necessary to determine patient infection status. The expected result is Negative.  Fact Sheet for Patients: EntrepreneurPulse.com.au  Fact Sheet for Healthcare Providers: IncredibleEmployment.be  This test is not yet approved or cleared by the [place] FDA and  has been authorized for detection and/or diagnosis of SARS-CoV-2 by FDA under an Emergency Use Authorization (EUA).  This EUA will remain in effect (meaning this test c an be used) for the duration of  the COVID-19 declaration under Section  564(b)(1) of the Act, 21 U.S.C. section 360bbb-3(b)(1), unless the authorization is terminated or revoked sooner.     Influenza A by PCR NEGATIVE NEGATIVE Final   Influenza B by PCR NEGATIVE NEGATIVE Final    Comment: (NOTE) The Xpert Xpress SARS-CoV-2/FLU/RSV plus assay is intended as an aid in the diagnosis of influenza from Nasopharyngeal swab specimens and should not be used as a sole basis for treatment. Nasal washings and aspirates are unacceptable for Xpert Xpress SARS-CoV-2/FLU/RSV testing.  Fact Sheet for Patients: EntrepreneurPulse.com.au  Fact Sheet for Healthcare Providers: IncredibleEmployment.be  This test is not yet approved or cleared by the [place] FDA  and has been authorized for detection and/or diagnosis of SARS-CoV-2 by FDA under an Emergency Use Authorization (EUA). This EUA will remain in effect (meaning this test can be used) for the duration of the COVID-19 declaration under Section 564(b)(1) of the Act, 21 U.S.C. section 360bbb-3(b)(1), unless the authorization is terminated or revoked.  Performed at [facility], [place] [address] [place], [place] [address]   Surgical pcr screen     Status: Abnormal   Collection Time: 06/30/20  7:10 PM   Specimen: Nasal Mucosa; Nasal Swab  Result Value Ref Range Status   MRSA, PCR NEGATIVE NEGATIVE Final   Staphylococcus aureus POSITIVE (A) NEGATIVE Final    Comment: (NOTE) The Xpert SA Assay (FDA approved for NASAL specimens in patients 68 years of age and older), is one component of a comprehensive surveillance program. It is not intended to diagnose infection nor to guide or monitor treatment. Performed at [facility], [place] [address] [place], [place] [address]     Time coordinating discharge: Approximately 40 minutes  Patrecia Pour, MD  [facility] 07/01/2020, 1:01 PM

## 2020-07-01 NOTE — Discharge Instructions (Addendum)
Start taking tamsulosin and finasteride to help reduce the size of the prostate and facilitate bladder emptying.  You tested positive for covid-19 infection though have no symptoms of this. The CDC recommends isolation for 10 days as long as you do not develop any symptoms. Seek medical attention if you develop shortness of breath, cough, chest pain.

## 2020-07-01 NOTE — Progress Notes (Signed)
Patient has voided Several times very small amts of pink urine. Patient appears to be trying to complete string of bottles as quickly as possible in order to d/c home. Urine does appear to be getting less red but encouraged patient to drink fluids and wait for larger volume to make sure. Notified MD of patient's urgency for discharge.

## 2020-07-01 NOTE — Progress Notes (Signed)
Patient ID: Eddie Mendoza, male   DOB: Jul 10, 1950, 70 y.o.   MRN: [id number] 1 Day Post-Op  Subjective: Cc: Gross hematuria with clot retention.  Patient was taken to the operating room last night for clot evacuation and fulguration.  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  The patient tolerated the procedure without any difficulty.  This morning his urine is clear on a very slow CBI.  The patient has no complaints of pain or discomfort.  ROS:  ROS  Anti-infectives: Anti-infectives (From admission, onward)   Start     Dose/Rate Route Frequency Ordered Stop   06/30/20 1900  ceFAZolin (ANCEF) IVPB 2g/100 mL premix        2 g 200 mL/hr over 30 Minutes Intravenous On call to O.R. 06/30/20 1409 06/30/20 2019   06/30/20 1345  ceFAZolin (ANCEF) powder 2 g  Status:  Discontinued        2 g Other To Surgery 06/30/20 1246 06/30/20 1409      Current Facility-Administered Medications  Medication Dose Route Frequency Provider Last Rate Last Admin  . acetaminophen (TYLENOL) tablet 650 mg  650 mg Oral Q6H PRN Harold Hedge, MD       Or  . acetaminophen (TYLENOL) suppository 650 mg  650 mg Rectal Q6H PRN Harold Hedge, MD      . bisacodyl (DULCOLAX) suppository 10 mg  10 mg Rectal Once Ardis Hughs, MD      . Chlorhexidine Gluconate Cloth 2 % PADS 6 each  6 each Topical Daily Little Ishikawa, MD   6 each at 06/30/20 [phone]  . Chlorhexidine Gluconate Cloth 2 % PADS 6 each  6 each Topical Q0600 Little Ishikawa, MD      . docusate sodium (COLACE) capsule 100 mg  100 mg Oral BID Ardis Hughs, MD      . finasteride (PROSCAR) tablet 5 mg  5 mg Oral Daily Irine Seal, MD   5 mg at 06/30/20 0942  . hydrALAZINE (APRESOLINE) injection 5 mg  5 mg Intravenous Q8H PRN Harold Hedge, MD      . magnesium citrate solution 1 Bottle  1 Bottle Oral Once Ardis Hughs, MD      . morphine 2 MG/ML injection 2 mg  2 mg Intravenous Q2H  PRN Little Ishikawa, MD      . mupirocin ointment (BACTROBAN) 2 % 1 application  1 application Nasal BID Little Ishikawa, MD   1 application at 02/40/97 0038  . polyethylene glycol (MIRALAX / GLYCOLAX) packet 17 g  17 g Oral Daily PRN Harold Hedge, MD      . sodium chloride irrigation 0.9 % 3,000 mL  3,000 mL Irrigation Continuous Harold Hedge, MD   3,000 mL at 06/30/20 2304     Objective: Vital signs in last 24 hours: Temp:  [98 F (36.7 C)-98.8 F (37.1 C)] 98 F (36.7 C) (04/13 0639) Pulse Rate:  [64-84] 64 (04/13 0639) Resp:  [7-29] 16 (04/13 0639) BP: (124-162)/(50-83) 129/54 (04/13 0639) SpO2:  [93 %-100 %] 99 % (04/13 0639)  Intake/Output from previous day: 04/12 0701 - 04/13 0700 In: 1051.7 [I.V.:400; Blood:251.7] Out: 3950 [Urine:3950] Intake/Output this shift: No intake/output data recorded.   Physical Exam  Lab Results:  Recent Labs    06/30/20 0315 07/01/20 0400  WBC 15.7* 19.7*  HGB 7.1* 8.4*  HCT 21.3* 25.3*  PLT 396 430*   BMET  Recent Labs    06/30/20 0315 07/01/20 0400  NA 135 139  K 3.6 4.2  CL 104 106  CO2 22 23  GLUCOSE 117* 144*  BUN 10 10  CREATININE 0.95 0.86  CALCIUM 8.4* 8.6*   PT/INR Recent Labs    06/28/20 1217  LABPROT 12.8  INR 1.0   ABG No results for input(s): PHART, HCO3 in the last 72 hours.  Invalid input(s): PCO2, PO2  Studies/Results: US PELVIS LIMITED (TRANSABDOMINAL ONLY)  Result Date: 06/30/2020 CLINICAL DATA:  Gross hematuria EXAM: LIMITED ULTRASOUND OF PELVIS TECHNIQUE: Limited transabdominal ultrasound examination of the pelvis/urinary bladder was performed. COMPARISON:  CT abdomen and pelvis 06/20/2020 FINDINGS: Mild diffuse bladder wall thickening. Large mass/filling defect identified within the bladder lumen measuring 10.2 x 7.5 by 9.4 cm. Mass is heterogeneous raising from slightly hyperechoic to hypoechoic, macrolobulated, with a few foci of interspersed fluid. A few brightly echogenic foci  are seen which could represent calcification or air. Portions of the mass demonstrate internal blood flow on color Doppler imaging, while other portions do not. Finding likely represents a combination of tumor and clot within the urinary bladder. IMPRESSION: 10.2 x 7.5 x 9.4 cm diameter mass/filling defect within bladder lumen, portions of which demonstrate internal blood flow on color Doppler imaging, favor a combination of tumor and clot. This could represent bladder tumor or less likely prostate tumor, unlikely prostatic enlargement. Electronically Signed   By: Lavonia Dana M.D.   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  At some point in the future he will need some sort of more definitive procedure, and I have expressed this to the patient in some detail.  We will continue this conversation as an outpatient.  I recommended that the Foley catheter be removed this morning and that we rack his urine to ensure that the bleeding is stopped.  In addition, the patient needs to be treated for his constipation to avoid any pushing and straining to have bowel movements which can exacerbate prostatic bleeding.  I have written for him to have a Dulcolax suppository as well as a bottle of magnesium citrate.  If the patient stable this afternoon and his urine remains clear he can be discharged home.  We will schedule close follow-up in our clinic.      LOS: 3 days    Ardis Hughs 07/01/2020 [phone]

## 2020-07-06 NOTE — Progress Notes (Incomplete)
History of Present Illness: Here for followup of gross hematuria.  Past Medical History:  Diagnosis Date  . Hypertension     No past surgical history on file.  Home Medications:  Allergies as of 07/07/2020   No Known Allergies     Medication List       Accurate as of July 06, 2020  7:55 PM. If you have any questions, ask your nurse or doctor.        finasteride 5 MG tablet Commonly known as: PROSCAR Take 1 tablet (5 mg total) by mouth daily.   lidocaine 5 % ointment Commonly known as: XYLOCAINE Apply 1 application topically 4 (four) times daily as needed for mild pain or moderate pain.   polyethylene glycol 17 g packet Commonly known as: MIRALAX / GLYCOLAX Take 17 g by mouth daily as needed for mild constipation.   tamsulosin 0.4 MG Caps capsule Commonly known as: Flomax Take 1 capsule (0.4 mg total) by mouth daily.       Allergies: No Known Allergies  No family history on file.  Social History:  reports that he has been smoking cigarettes. He has a 15.00 pack-year smoking history. He has never used smokeless tobacco. He reports current alcohol use of about 3.0 standard drinks of alcohol per week. He reports that he does not use drugs.  ROS: A complete review of systems was performed.  All systems are negative except for pertinent findings as noted.  Physical Exam:  Vital signs in last 24 hours: There were no vitals taken for this visit. Constitutional:  Alert and oriented, No acute distress Cardiovascular: Regular rate  Respiratory: Normal respiratory effort GI: Abdomen is soft, nontender, nondistended, no abdominal masses. No CVAT.  Genitourinary: Normal male phallus, testes are descended bilaterally and non-tender and without masses, scrotum is normal in appearance without lesions or masses, perineum is normal on inspection. Lymphatic: No lymphadenopathy Neurologic: Grossly intact, no focal deficits Psychiatric: Normal mood and affect  Laboratory  Data:  No results for input(s): WBC, HGB, HCT, PLT in the last 72 hours.  No results for input(s): NA, K, CL, GLUCOSE, BUN, CALCIUM, CREATININE in the last 72 hours.  Invalid input(s): CO3   No results found for this or any previous visit (from the past 24 hour(s)). Recent Results (from the past 240 hour(s))  Urine culture     Status: None   Collection Time: 06/28/20 11:06 AM   Specimen: Urine, Clean Catch  Result Value Ref Range Status   Specimen Description   Final    URINE, CLEAN CATCH Performed at [facility], [place] [address], [place], [place] [address]    Special Requests   Final    NONE Performed at [facility], [place] [address], [place], [place] [address]    Culture   Final    NO GROWTH Performed at [facility], [place] [address], [place], [place] [address]    Report Status 06/29/2020 FINAL  Final  Resp Panel by RT-PCR (Flu A&B, Covid) Nasopharyngeal Swab     Status: Abnormal   Collection Time: 06/28/20  1:22 PM   Specimen: Nasopharyngeal Swab; Nasopharyngeal(NP) swabs in vial transport medium  Result Value Ref Range Status   SARS Coronavirus 2 by RT PCR POSITIVE (A) NEGATIVE Final    Comment: RESULT CALLED TO, READ BACK BY AND VERIFIED WITH: STISKA,J RN @1606  ON 06/28/20 JACKSON,K (NOTE) SARS-CoV-2 target nucleic acids are DETECTED.  The SARS-CoV-2 RNA is generally detectable in  upper respiratory specimens during the acute phase of infection. Positive results are indicative of the presence of the identified virus, but do not rule out bacterial infection or co-infection with other pathogens not detected by the test. Clinical correlation with patient history and other diagnostic information is necessary to determine patient infection status. The expected result is Negative.  Fact Sheet for Patients: EntrepreneurPulse.com.au  Fact Sheet for Healthcare  Providers: IncredibleEmployment.be  This test is not yet approved or cleared by the [place] FDA and  has been authorized for detection and/or diagnosis of SARS-CoV-2 by FDA under an Emergency Use Authorization (EUA).  This EUA will remain in effect (meaning this test c an be used) for the duration of  the COVID-19 declaration under Section 564(b)(1) of the Act, 21 U.S.C. section 360bbb-3(b)(1), unless the authorization is terminated or revoked sooner.     Influenza A by PCR NEGATIVE NEGATIVE Final   Influenza B by PCR NEGATIVE NEGATIVE Final    Comment: (NOTE) The Xpert Xpress SARS-CoV-2/FLU/RSV plus assay is intended as an aid in the diagnosis of influenza from Nasopharyngeal swab specimens and should not be used as a sole basis for treatment. Nasal washings and aspirates are unacceptable for Xpert Xpress SARS-CoV-2/FLU/RSV testing.  Fact Sheet for Patients: EntrepreneurPulse.com.au  Fact Sheet for Healthcare Providers: IncredibleEmployment.be  This test is not yet approved or cleared by the [place] FDA and has been authorized for detection and/or diagnosis of SARS-CoV-2 by FDA under an Emergency Use Authorization (EUA). This EUA will remain in effect (meaning this test can be used) for the duration of the COVID-19 declaration under Section 564(b)(1) of the Act, 21 U.S.C. section 360bbb-3(b)(1), unless the authorization is terminated or revoked.  Performed at [facility], [place] [address] [place], [place] [address]   Surgical pcr screen     Status: Abnormal   Collection Time: 06/30/20  7:10 PM   Specimen: Nasal Mucosa; Nasal Swab  Result Value Ref Range Status   MRSA, PCR NEGATIVE NEGATIVE Final   Staphylococcus aureus POSITIVE (A) NEGATIVE Final    Comment: (NOTE) The Xpert SA Assay (FDA approved for NASAL specimens in patients 66 years of age and older), is one component of a  comprehensive surveillance program. It is not intended to diagnose infection nor to guide or monitor treatment. Performed at [facility], [place] [address] [place],  [address]     Renal Function: Recent Labs    06/30/20 0315 07/01/20 0400  CREATININE 0.95 0.86   Estimated Creatinine Clearance: 96.4 mL/min (by C-G formula based on SCr of 0.86 mg/dL).  Radiologic Imaging: No results found.  Impression/Assessment:  ***  Plan:  ***

## 2020-07-07 ENCOUNTER — Ambulatory Visit: Payer: No Typology Code available for payment source | Admitting: Urology

## 2020-07-07 DIAGNOSIS — R319 Hematuria, unspecified: Secondary | ICD-10-CM

## 2020-07-13 ENCOUNTER — Encounter (HOSPITAL_COMMUNITY): Payer: Self-pay | Admitting: Urology

## 2020-07-13 NOTE — Progress Notes (Incomplete)
H&P  Chief Complaint: ***  History of Present Illness: ***  Past Medical History:  Diagnosis Date  . Hypertension     Past Surgical History:  Procedure Laterality Date  . TRANSURETHRAL RESECTION OF BLADDER TUMOR N/A 06/30/2020   Procedure:  Almyra Free OF BLEEDING, CLOT EVACUATION;  Surgeon: Ardis Hughs, MD;  Location: [facility];  Service: Urology;  Laterality: N/A;    Home Medications:  Allergies as of 07/14/2020   No Known Allergies     Medication List       Accurate as of July 13, 2020  8:15 PM. If you have any questions, ask your nurse or doctor.        finasteride 5 MG tablet Commonly known as: PROSCAR Take 1 tablet (5 mg total) by mouth daily.   lidocaine 5 % ointment Commonly known as: XYLOCAINE Apply 1 application topically 4 (four) times daily as needed for mild pain or moderate pain.   polyethylene glycol 17 g packet Commonly known as: MIRALAX / GLYCOLAX Take 17 g by mouth daily as needed for mild constipation.   tamsulosin 0.4 MG Caps capsule Commonly known as: Flomax Take 1 capsule (0.4 mg total) by mouth daily.       Allergies: No Known Allergies  No family history on file.  Social History:  reports that he has been smoking cigarettes. He has a 15.00 pack-year smoking history. He has never used smokeless tobacco. He reports current alcohol use of about 3.0 standard drinks of alcohol per week. He reports that he does not use drugs.  ROS: A complete review of systems was performed.  All systems are negative except for pertinent findings as noted.  Physical Exam:  Vital signs in last 24 hours: There were no vitals taken for this visit. Constitutional:  Alert and oriented, No acute distress Cardiovascular: Regular rate  Respiratory: Normal respiratory effort GI: Abdomen is soft, nontender, nondistended, no abdominal masses. No CVAT.  Genitourinary: Normal male phallus, testes are descended bilaterally and non-tender and without masses,  scrotum is normal in appearance without lesions or masses, perineum is normal on inspection. Lymphatic: No lymphadenopathy Neurologic: Grossly intact, no focal deficits Psychiatric: Normal mood and affect  Laboratory Data:  No results for input(s): WBC, HGB, HCT, PLT in the last 72 hours.  No results for input(s): NA, K, CL, GLUCOSE, BUN, CALCIUM, CREATININE in the last 72 hours.  Invalid input(s): CO3   No results found for this or any previous visit (from the past 24 hour(s)). No results found for this or any previous visit (from the past 240 hour(s)).  Renal Function: No results for input(s): CREATININE in the last 168 hours. CrCl cannot be calculated (Unknown ideal weight.).  Radiologic Imaging: No results found.  Impression/Assessment:  ***  Plan:  ***

## 2020-07-14 ENCOUNTER — Other Ambulatory Visit: Payer: Self-pay

## 2020-07-14 ENCOUNTER — Ambulatory Visit (INDEPENDENT_AMBULATORY_CARE_PROVIDER_SITE_OTHER): Payer: No Typology Code available for payment source | Admitting: Urology

## 2020-07-14 DIAGNOSIS — R319 Hematuria, unspecified: Secondary | ICD-10-CM

## 2020-07-29 ENCOUNTER — Other Ambulatory Visit: Payer: Self-pay

## 2020-07-29 ENCOUNTER — Emergency Department (HOSPITAL_COMMUNITY)
Admission: EM | Admit: 2020-07-29 | Discharge: 2020-07-29 | Disposition: A | Discharge status: Home / Self Care | Payer: No Typology Code available for payment source | Attending: Emergency Medicine | Admitting: Emergency Medicine

## 2020-07-29 ENCOUNTER — Encounter (HOSPITAL_COMMUNITY): Payer: Self-pay | Admitting: *Deleted

## 2020-07-29 DIAGNOSIS — I10 Essential (primary) hypertension: Secondary | ICD-10-CM | POA: Insufficient documentation

## 2020-07-29 DIAGNOSIS — F17210 Nicotine dependence, cigarettes, uncomplicated: Secondary | ICD-10-CM | POA: Diagnosis not present

## 2020-07-29 DIAGNOSIS — R799 Abnormal finding of blood chemistry, unspecified: Secondary | ICD-10-CM | POA: Diagnosis present

## 2020-07-29 DIAGNOSIS — Z8616 Personal history of COVID-19: Secondary | ICD-10-CM | POA: Diagnosis not present

## 2020-07-29 DIAGNOSIS — D649 Anemia, unspecified: Secondary | ICD-10-CM | POA: Insufficient documentation

## 2020-07-29 LAB — CBC WITH DIFFERENTIAL/PLATELET
Abs Immature Granulocytes: 0.06 10*3/uL (ref 0.00–0.07)
Basophils Absolute: 0.1 10*3/uL (ref 0.0–0.1)
Basophils Relative: 1 %
Eosinophils Absolute: 0.3 10*3/uL (ref 0.0–0.5)
Eosinophils Relative: 4 %
HCT: 19.6 % — ABNORMAL LOW (ref 39.0–52.0)
Hemoglobin: 5.9 g/dL — CL (ref 13.0–17.0)
Immature Granulocytes: 1 %
Lymphocytes Relative: 20 %
Lymphs Abs: 1.7 10*3/uL (ref 0.7–4.0)
MCH: 26.8 pg (ref 26.0–34.0)
MCHC: 30.1 g/dL (ref 30.0–36.0)
MCV: 89.1 fL (ref 80.0–100.0)
Monocytes Absolute: 0.7 10*3/uL (ref 0.1–1.0)
Monocytes Relative: 9 %
Neutro Abs: 5.6 10*3/uL (ref 1.7–7.7)
Neutrophils Relative %: 65 %
Platelets: 344 10*3/uL (ref 150–400)
RBC: 2.2 MIL/uL — ABNORMAL LOW (ref 4.22–5.81)
RDW: 15.1 % (ref 11.5–15.5)
WBC: 8.5 10*3/uL (ref 4.0–10.5)
nRBC: 0 % (ref 0.0–0.2)

## 2020-07-29 LAB — BASIC METABOLIC PANEL
Anion gap: 9 (ref 5–15)
BUN: 12 mg/dL (ref 8–23)
CO2: 24 mmol/L (ref 22–32)
Calcium: 8.8 mg/dL — ABNORMAL LOW (ref 8.9–10.3)
Chloride: 106 mmol/L (ref 98–111)
Creatinine, Ser: 1.02 mg/dL (ref 0.61–1.24)
GFR, Estimated: >60 mL/min (ref >60)
Glucose, Bld: 122 mg/dL — ABNORMAL HIGH (ref 70–99)
Potassium: 4.1 mmol/L (ref 3.5–5.1)
Sodium: 139 mmol/L (ref 135–145)

## 2020-07-29 LAB — PREPARE RBC (CROSSMATCH)

## 2020-07-29 MED ORDER — SODIUM CHLORIDE 0.9% IV SOLUTION: Freq: Once | INTRAVENOUS | Status: AC

## 2020-07-29 NOTE — ED Provider Notes (Signed)
[facility] DEPT Provider Note   CSN: [id number] Arrival date & time: 07/29/20  1336     History Chief Complaint  Patient presents with  . Abnormal Lab    Low hgb    Eddie Mendoza is a 70 y.o. male.  HPI Patient with baseline anemia presents after outpatient visit at the [facility], with subsequent notification of abnormal blood counts.  Patient denies pain, weakness, melena, hematemesis. He has had hematuria substantially, recently, though this has cleared, and he has none currently.  He is working with his urologist due to hematuria, prostate issues.  Today he went to the [place] for primary care follow-up.  Labs performed there were reportedly notable for anemia.  He was notified of abnormal results and presents for evaluation.  He denies chest pain, dyspnea, nausea, vomiting.    Past Medical History:  Diagnosis Date  . Hypertension     Patient Active Problem List   Diagnosis Date Noted  .   06/28/2020  . Acute blood loss anemia 06/28/2020  . COVID-19 virus infection 06/28/2020  . Hematuria 06/20/2020  . HTN (hypertension) 06/20/2020    Past Surgical History:  Procedure Laterality Date  . TRANSURETHRAL RESECTION OF BLADDER TUMOR N/A 06/30/2020   Procedure:  Almyra Free OF BLEEDING, CLOT EVACUATION;  Surgeon: Ardis Hughs, MD;  Location: [facility];  Service: Urology;  Laterality: N/A;       No family history on file.  Social History   Tobacco Use  . Smoking status: Current Every Day Smoker    Packs/day: 0.50    Years: 30.00    Pack years: 15.00    Types: Cigarettes  . Smokeless tobacco: Never Used  Substance Use Topics  . Alcohol use: Yes    Alcohol/week: 3.0 standard drinks    Types: 3 Shots of liquor per week  . Drug use: No    Home Medications Prior to Admission medications   Medication Sig Start Date End Date Taking? Authorizing Provider  finasteride (PROSCAR) 5 MG tablet Take 1 tablet (5 mg total) by  mouth daily. 07/02/20   Patrecia Pour, MD  lidocaine (XYLOCAINE) 5 % ointment Apply 1 application topically 4 (four) times daily as needed for mild pain or moderate pain. 04/15/20   [provider]  polyethylene glycol (MIRALAX / GLYCOLAX) 17 g packet Take 17 g by mouth daily as needed for mild constipation. 06/22/20   Pokhrel, Corrie Mckusick, MD  tamsulosin (FLOMAX) 0.4 MG CAPS capsule Take 1 capsule (0.4 mg total) by mouth daily. 07/01/20   Patrecia Pour, MD    Allergies    Patient has no known allergies.  Review of Systems   Review of Systems  Constitutional:       Per HPI, otherwise negative  HENT:       Per HPI, otherwise negative  Respiratory:       Per HPI, otherwise negative  Cardiovascular:       Per HPI, otherwise negative  Gastrointestinal: Negative for vomiting.  Endocrine:       Negative aside from HPI  Genitourinary:       Neg aside from HPI   Musculoskeletal:       Per HPI, otherwise negative  Skin: Negative.   Neurological: Negative for syncope.    Physical Exam Updated Vital Signs BP (!) 143/87   Pulse 60   Temp 98.2 F (36.8 C) (Oral)   Resp 18   SpO2 100%   Physical Exam Vitals and  nursing note reviewed.  Constitutional:      General: He is not in acute distress.    Appearance: He is well-developed.  HENT:     Head: Normocephalic and atraumatic.  Eyes:     Conjunctiva/sclera: Conjunctivae normal.  Cardiovascular:     Rate and Rhythm: Normal rate and regular rhythm.  Pulmonary:     Effort: Pulmonary effort is normal. No respiratory distress.     Breath sounds: No stridor.  Abdominal:     General: There is no distension.  Skin:    General: Skin is warm and dry.  Neurological:     Mental Status: He is alert and oriented to person, place, and time.     ED Results / Procedures / Treatments   Labs (all labs ordered are listed, but only abnormal results are displayed) Labs Reviewed  BASIC METABOLIC PANEL - Abnormal; Notable for the following  components:      Result Value   Glucose, Bld 122 (*)    Calcium 8.8 (*)    All other components within normal limits  CBC WITH DIFFERENTIAL/PLATELET - Abnormal; Notable for the following components:   RBC 2.20 (*)    Hemoglobin 5.9 (*)    HCT 19.6 (*)    All other components within normal limits  TYPE AND SCREEN  PREPARE RBC (CROSSMATCH)    Procedures Procedures   Medications Ordered in ED Medications  0.9 %  sodium chloride infusion (Manually program via Guardrails IV Fluids) ( Intravenous New Bag/Given 07/29/20 1514)    ED Course  I have reviewed the triage vital signs and the nursing notes.  Pertinent labs & imaging results that were available during my care of the patient were reviewed by me and considered in my medical decision making (see chart for details).   Update: On repeat exam the patient is awake, alert, in no distress.  Hemoglobin noted 5.9, notably down from baseline of 7/8.  He continues to deny any hematemesis, states that he has had resolution of hematuria. Blood counts also consistent with microcytic anemia, slightly worse than baseline.  Patient amenable to transfusion.    5:43 PM Patient has completed 1 unit PRBC transfusion, smiling, eating, denies complaints.  Elderly male with baseline mild anemia presents with worsening numbers.  Patient is awake and alert, not hypotensive, but with concern for critically abnormal hemoglobin value, though his symptoms are mild, we discussed need for transfusion to which she is amenable.  Patient received this without complication, had no new complaints, is amenable to following up with the [facility] and urology for ongoing care. MDM Rules/Calculators/A&P CRITICAL CARE Performed by: Carmin Muskrat Total critical care time: 35 minutes Critical care time was exclusive of separately billable procedures and treating other patients. Critical care was necessary to treat or prevent imminent or life-threatening  deterioration. Critical care was time spent personally by me on the following activities: development of treatment plan with patient and/or surrogate as well as nursing, discussions with consultants, evaluation of patient's response to treatment, examination of patient, obtaining history from patient or surrogate, ordering and performing treatments and interventions, ordering and review of laboratory studies, ordering and review of radiographic studies, pulse oximetry and re-evaluation of patient's condition.  Final Clinical Impression(s) / ED Diagnoses Final diagnoses:  Symptomatic anemia     Carmin Muskrat, MD 07/29/20 1746

## 2020-07-29 NOTE — Discharge Instructions (Addendum)
As discussed, your evaluation today has been largely reassuring.  But, it is important that you monitor your condition carefully, and do not hesitate to return to the ED if you develop new, or concerning changes in your condition.  Otherwise, please follow-up with your physician for appropriate ongoing care.  Please be sure to call tomorrow to discuss today's need for transfusion and your anemia.

## 2020-07-29 NOTE — ED Triage Notes (Signed)
Pt was called by his [facility] doctor and told he needed a blood transfusion. He had hemoglobin checked today.

## 2020-07-29 NOTE — ED Notes (Signed)
An After Visit Summary was printed and given to the patient. Discharge instructions given and no further questions at this time.  Pt leaving with wife.  

## 2020-07-30 LAB — TYPE AND SCREEN
ABO/RH(D): A POS
Antibody Screen: NEGATIVE
Unit division: 0

## 2020-07-30 LAB — BPAM RBC
Blood Product Expiration Date: 202205282359
ISSUE DATE / TIME: 202205111502
Unit Type and Rh: 6200

## 2020-08-03 NOTE — Progress Notes (Incomplete)
History of Present Illness: 70 yo male here for followup of recurrent gross hematuria.    Past Medical History:  Diagnosis Date  . Hypertension     Past Surgical History:  Procedure Laterality Date  . TRANSURETHRAL RESECTION OF BLADDER TUMOR N/A 06/30/2020   Procedure:  Almyra Free OF BLEEDING, CLOT EVACUATION;  Surgeon: Ardis Hughs, MD;  Location: [facility];  Service: Urology;  Laterality: N/A;    Home Medications:  Allergies as of 08/04/2020   No Known Allergies     Medication List       Accurate as of Aug 03, 2020  8:14 PM. If you have any questions, ask your nurse or doctor.        finasteride 5 MG tablet Commonly known as: PROSCAR Take 1 tablet (5 mg total) by mouth daily.   lidocaine 5 % ointment Commonly known as: XYLOCAINE Apply 1 application topically 4 (four) times daily as needed for mild pain or moderate pain.   polyethylene glycol 17 g packet Commonly known as: MIRALAX / GLYCOLAX Take 17 g by mouth daily as needed for mild constipation.   tamsulosin 0.4 MG Caps capsule Commonly known as: Flomax Take 1 capsule (0.4 mg total) by mouth daily.       Allergies: No Known Allergies  No family history on file.  Social History:  reports that he has been smoking cigarettes. He has a 15.00 pack-year smoking history. He has never used smokeless tobacco. He reports current alcohol use of about 3.0 standard drinks of alcohol per week. He reports that he does not use drugs.  ROS: A complete review of systems was performed.  All systems are negative except for pertinent findings as noted.  Physical Exam:  Vital signs in last 24 hours: There were no vitals taken for this visit. Constitutional:  Alert and oriented, No acute distress Cardiovascular: Regular rate  Respiratory: Normal respiratory effort GI: Abdomen is soft, nontender, nondistended, no abdominal masses. No CVAT.  Genitourinary: Normal male phallus, testes are descended bilaterally and  non-tender and without masses, scrotum is normal in appearance without lesions or masses, perineum is normal on inspection. Lymphatic: No lymphadenopathy Neurologic: Grossly intact, no focal deficits Psychiatric: Normal mood and affect  Laboratory Data:  No results for input(s): WBC, HGB, HCT, PLT in the last 72 hours.  No results for input(s): NA, K, CL, GLUCOSE, BUN, CALCIUM, CREATININE in the last 72 hours.  Invalid input(s): CO3   No results found for this or any previous visit (from the past 24 hour(s)). No results found for this or any previous visit (from the past 240 hour(s)).  Renal Function: Recent Labs    07/29/20 1407  CREATININE 1.02   CrCl cannot be calculated (Unknown ideal weight.).  Radiologic Imaging: No results found.  Impression/Assessment:  ***  Plan:  ***

## 2020-08-04 ENCOUNTER — Ambulatory Visit: Payer: No Typology Code available for payment source | Admitting: Urology

## 2020-08-04 DIAGNOSIS — R319 Hematuria, unspecified: Secondary | ICD-10-CM

## 2020-08-12 ENCOUNTER — Inpatient Hospital Stay (HOSPITAL_COMMUNITY)
Admission: EM | Admit: 2020-08-12 | Discharge: 2020-08-14 | DRG: 713 | Disposition: A | Discharge status: Home / Self Care | Payer: No Typology Code available for payment source | Attending: Family Medicine | Admitting: Family Medicine

## 2020-08-12 ENCOUNTER — Emergency Department (HOSPITAL_COMMUNITY): Payer: No Typology Code available for payment source

## 2020-08-12 ENCOUNTER — Inpatient Hospital Stay (HOSPITAL_COMMUNITY): Payer: No Typology Code available for payment source

## 2020-08-12 ENCOUNTER — Encounter (HOSPITAL_COMMUNITY): Payer: Self-pay | Admitting: *Deleted

## 2020-08-12 DIAGNOSIS — N421 Congestion and hemorrhage of prostate: Secondary | ICD-10-CM | POA: Diagnosis present

## 2020-08-12 DIAGNOSIS — F17210 Nicotine dependence, cigarettes, uncomplicated: Secondary | ICD-10-CM | POA: Diagnosis present

## 2020-08-12 DIAGNOSIS — R310 Gross hematuria: Secondary | ICD-10-CM | POA: Diagnosis present

## 2020-08-12 DIAGNOSIS — Z79899 Other long term (current) drug therapy: Secondary | ICD-10-CM | POA: Diagnosis not present

## 2020-08-12 DIAGNOSIS — D62 Acute posthemorrhagic anemia: Secondary | ICD-10-CM | POA: Diagnosis present

## 2020-08-12 DIAGNOSIS — Z20822 Contact with and (suspected) exposure to covid-19: Secondary | ICD-10-CM | POA: Diagnosis present

## 2020-08-12 DIAGNOSIS — I10 Essential (primary) hypertension: Secondary | ICD-10-CM | POA: Diagnosis present

## 2020-08-12 DIAGNOSIS — N138 Other obstructive and reflux uropathy: Secondary | ICD-10-CM | POA: Diagnosis present

## 2020-08-12 DIAGNOSIS — R932 Abnormal findings on diagnostic imaging of liver and biliary tract: Secondary | ICD-10-CM

## 2020-08-12 DIAGNOSIS — F1510 Other stimulant abuse, uncomplicated: Secondary | ICD-10-CM | POA: Diagnosis present

## 2020-08-12 DIAGNOSIS — R319 Hematuria, unspecified: Secondary | ICD-10-CM

## 2020-08-12 DIAGNOSIS — K829 Disease of gallbladder, unspecified: Secondary | ICD-10-CM | POA: Diagnosis present

## 2020-08-12 DIAGNOSIS — D500 Iron deficiency anemia secondary to blood loss (chronic): Secondary | ICD-10-CM

## 2020-08-12 LAB — CBC
HCT: 22.8 % — ABNORMAL LOW (ref 39.0–52.0)
Hemoglobin: 6.7 g/dL — CL (ref 13.0–17.0)
MCH: 24.4 pg — ABNORMAL LOW (ref 26.0–34.0)
MCHC: 29.4 g/dL — ABNORMAL LOW (ref 30.0–36.0)
MCV: 82.9 fL (ref 80.0–100.0)
Platelets: 474 10*3/uL — ABNORMAL HIGH (ref 150–400)
RBC: 2.75 MIL/uL — ABNORMAL LOW (ref 4.22–5.81)
RDW: 16.1 % — ABNORMAL HIGH (ref 11.5–15.5)
WBC: 10.1 10*3/uL (ref 4.0–10.5)
nRBC: 0 % (ref 0.0–0.2)

## 2020-08-12 LAB — URINALYSIS, ROUTINE W REFLEX MICROSCOPIC

## 2020-08-12 LAB — BASIC METABOLIC PANEL
Anion gap: 9 (ref 5–15)
BUN: 19 mg/dL (ref 8–23)
CO2: 23 mmol/L (ref 22–32)
Calcium: 9 mg/dL (ref 8.9–10.3)
Chloride: 104 mmol/L (ref 98–111)
Creatinine, Ser: 1.16 mg/dL (ref 0.61–1.24)
GFR, Estimated: >60 mL/min (ref >60)
Glucose, Bld: 123 mg/dL — ABNORMAL HIGH (ref 70–99)
Potassium: 3.7 mmol/L (ref 3.5–5.1)
Sodium: 136 mmol/L (ref 135–145)

## 2020-08-12 LAB — PREPARE RBC (CROSSMATCH)

## 2020-08-12 LAB — URINALYSIS, MICROSCOPIC (REFLEX)
Bacteria, UA: NONE SEEN
Mucus: NONE SEEN
RBC / HPF: >50 RBC/hpf (ref 0–5)
WBC Clumps: NONE SEEN

## 2020-08-12 MED ORDER — OXYCODONE HCL 5 MG PO TABS
5.0000 mg | ORAL_TABLET | ORAL | Status: DC | PRN
Start: 2020-08-12 — End: 2020-08-14

## 2020-08-12 MED ORDER — ACETAMINOPHEN 650 MG RE SUPP: 650.0000 mg | Freq: Four times a day (QID) | RECTAL | Status: DC | PRN

## 2020-08-12 MED ORDER — ACETAMINOPHEN 325 MG PO TABS: 650.0000 mg | ORAL_TABLET | Freq: Four times a day (QID) | ORAL | Status: DC | PRN

## 2020-08-12 MED ORDER — SODIUM CHLORIDE 0.9 % IV SOLN
10.0000 mL/h | Freq: Once | INTRAVENOUS | Status: AC
  Administered 2020-08-12: 10 mL/h via INTRAVENOUS

## 2020-08-12 NOTE — ED Notes (Signed)
Blood continues without problems.  Foley catheter intact with bloody urine noted.  Approx 200 mls

## 2020-08-12 NOTE — ED Notes (Signed)
1 st unit of PRBCs started.  The patient was educated on s/s to report to the nurse

## 2020-08-12 NOTE — ED Notes (Signed)
ED TO INPATIENT HANDOFF REPORT  Name/Age/Gender Eddie Mendoza 70 y.o. male  Code Status Code Status History    Date Active Date Inactive Code Status Order ID Comments User Context   06/28/2020 1425 07/01/2020 1820 Full Code [id number]  Harold Hedge, MD ED   06/20/2020 2219 06/22/2020 1731 Full Code [id number]  Bethena Roys, MD Inpatient   Advance Care Planning Activity    Questions for Most Recent Historical Code Status (Order [id number])       Home/SNF/Other Home  Chief Complaint Gross hematuria [R31.0]  Level of Care/Admitting Diagnosis ED Disposition    ED Disposition Condition [facility]: [facility] [[id number]]  Level of Care: Telemetry [5]  Admit to tele based on following criteria: Monitor for Ischemic changes  May admit patient to [facility] or [facility] if equivalent level of care is available:: No  Covid Evaluation: Recent COVID positive no isolation required infection day 21-90  Diagnosis: Gross hematuria [599.71.ICD-9-CM]  Admitting Physician: Jonnie Finner [[id number]]  Attending Physician: Jonnie Finner [[id number]]  Estimated length of stay: past midnight tomorrow  Certification:: I certify this patient will need inpatient services for at least 2 midnights       Medical History Past Medical History:  Diagnosis Date  . Hypertension     Allergies No Known Allergies  IV Location/Drains/Wounds Patient Lines/Drains/Airways Status    Active Line/Drains/Airways    Name Placement date Placement time Site Days   Peripheral IV 08/12/20 20 G Right 08/12/20  1429  --  less than 1          Labs/Imaging Results for orders placed or performed during the hospital encounter of 08/12/20 (from the past 48 hour(s))  CBC     Status: Abnormal   Collection Time: 08/12/20  1:47 PM  Result Value Ref Range   WBC 10.1 4.0 - 10.5 K/uL   RBC 2.75 (L) 4.22 - 5.81 MIL/uL   Hemoglobin 6.7 (LL) 13.0 - 17.0 g/dL    Comment:  REPEATED TO VERIFY THIS CRITICAL RESULT HAS VERIFIED AND BEEN CALLED TO SAVOY,B RN BY MARINDA BLACK ON 05 25 2022 AT 1447, AND HAS BEEN READ BACK. RBV CRITICAL HGB 6.7    HCT 22.8 (L) 39.0 - 52.0 %   MCV 82.9 80.0 - 100.0 fL   MCH 24.4 (L) 26.0 - 34.0 pg   MCHC 29.4 (L) 30.0 - 36.0 g/dL   RDW 16.1 (H) 11.5 - 15.5 %   Platelets 474 (H) 150 - 400 K/uL   nRBC 0.0 0.0 - 0.2 %    Comment: Performed at [facility], [place] [address], [place], [place] [address]  Basic metabolic panel     Status: Abnormal   Collection Time: 08/12/20  1:47 PM  Result Value Ref Range   Sodium 136 135 - 145 mmol/L   Potassium 3.7 3.5 - 5.1 mmol/L   Chloride 104 98 - 111 mmol/L   CO2 23 22 - 32 mmol/L   Glucose, Bld 123 (H) 70 - 99 mg/dL    Comment: Glucose reference range applies only to samples taken after fasting for at least 8 hours.   BUN 19 8 - 23 mg/dL   Creatinine, Ser 1.16 0.61 - 1.24 mg/dL   Calcium 9.0 8.9 - 10.3 mg/dL   GFR, Estimated >60 >60 mL/min    Comment: (NOTE) Calculated using the CKD-EPI Creatinine Equation (2021)    Anion gap 9 5 - 15  Comment: Performed at [facility], [place] [address] [place], [place] [address]  Urinalysis, Routine w reflex microscopic Urine, Clean Catch     Status: Abnormal   Collection Time: 08/12/20  2:06 PM  Result Value Ref Range   Color, Urine RED (A) YELLOW   APPearance TURBID (A) CLEAR   Specific Gravity, Urine  1.005 - 1.030    TEST NOT REPORTED DUE TO COLOR INTERFERENCE OF URINE PIGMENT   pH  5.0 - 8.0    TEST NOT REPORTED DUE TO COLOR INTERFERENCE OF URINE PIGMENT   Glucose, UA (A) NEGATIVE mg/dL    TEST NOT REPORTED DUE TO COLOR INTERFERENCE OF URINE PIGMENT   Hgb urine dipstick (A) NEGATIVE    TEST NOT REPORTED DUE TO COLOR INTERFERENCE OF URINE PIGMENT   Bilirubin Urine (A) NEGATIVE    TEST NOT REPORTED DUE TO COLOR INTERFERENCE OF URINE PIGMENT   Ketones, ur (A) NEGATIVE mg/dL    TEST NOT REPORTED DUE TO  COLOR INTERFERENCE OF URINE PIGMENT   Protein, ur (A) NEGATIVE mg/dL    TEST NOT REPORTED DUE TO COLOR INTERFERENCE OF URINE PIGMENT   Nitrite (A) NEGATIVE    TEST NOT REPORTED DUE TO COLOR INTERFERENCE OF URINE PIGMENT   Leukocytes,Ua (A) NEGATIVE    TEST NOT REPORTED DUE TO COLOR INTERFERENCE OF URINE PIGMENT    Comment: Performed at [facility], [place] [address] [place], [place] [address]  Urinalysis, Microscopic (reflex)     Status: Abnormal   Collection Time: 08/12/20  2:06 PM  Result Value Ref Range   RBC / HPF >50 0 - 5 RBC/hpf   WBC, UA 0-5 0 - 5 WBC/hpf   Bacteria, UA NONE SEEN NONE SEEN   Squamous Epithelial / LPF 0-5 0 - 5   Non Squamous Epithelial 0-5 (A) NONE SEEN   WBC Clumps NONE SEEN    Mucus NONE SEEN     Comment: Performed at [facility], [place] [address] [place], [place] [address]  Type and screen     Status: None (Preliminary result)   Collection Time: 08/12/20  3:02 PM  Result Value Ref Range   ABO/RH(D) A POS    Antibody Screen NEG    Sample Expiration 08/15/2020,2359    Unit Number [id number]    Blood Component Type RED CELLS,LR    Unit division 00    Status of Unit ISSUED    Transfusion Status OK TO TRANSFUSE    Crossmatch Result      Compatible Performed at [facility] [address], [place], [place] [address]   Prepare RBC (crossmatch)     Status: None   Collection Time: 08/12/20  3:30 PM  Result Value Ref Range   Order Confirmation      ORDER PROCESSED BY BLOOD BANK Performed at [facility] [address] [place], [place] [address]    CT ABDOMEN PELVIS WO CONTRAST  Result Date: 08/12/2020 CLINICAL DATA:  Hematuria. Lower abdominal pain for 2 weeks. 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 EXAM: CT ABDOMEN AND PELVIS WITHOUT CONTRAST TECHNIQUE: Multidetector CT  imaging of the abdomen and pelvis was performed following the standard protocol without IV contrast. COMPARISON:  CT 06/20/2020 FINDINGS: Lower chest: Clear lung bases without pleural effusion or focal airspace disease. Normal heart size. Hepatobiliary: No evidence of focal liver lesion on this noncontrast exam.  Gallbladder is physiologically distended. There is abnormal thickening about the neck of the gallbladder again spanning at least 2.5 cm, also seen on prior. There is abnormal thickening about Phrygian cap of the gallbladder, series 4, image 45. No pericholecystic fat stranding. Proximal common bile duct appears prominent, but is not well assessed on this noncontrast exam. Distal common bile duct is nondistended. Pancreas: No ductal dilatation or inflammation. Spleen: Normal in size without focal abnormality. Adrenals/Urinary Tract: Normal adrenal glands. No hydronephrosis or renal calculi. No perinephric edema. No evidence of focal renal lesion on this noncontrast exam. Rounded heterogeneous hyperdense material within the bladder lumen spans approximately 7.2 x 6.3 x 9.3 cm. This has diminished in size from prior CT. This appears elevated above a rounded soft tissue density arising from the bladder base, which is may be with the prostate. Bladder wall thickening and trabeculation. No significant perivesicular fat stranding. Stomach/Bowel: Decompressed stomach. Unremarkable small bowel. No obstruction or inflammation. Normal appendix. Moderate colonic stool burden with mild colonic tortuosity. No colonic inflammation. Vascular/Lymphatic: Aortic atherosclerosis. No aneurysm. No enlarged lymph nodes in the abdomen or pelvis. Reproductive: Enlarged prostate gland which is difficult to delineate from soft tissue density extending into the base of the bladder, less well-defined on this noncontrast exam the previous. Other: No free air or ascites. Musculoskeletal: There are no acute or suspicious osseous  abnormalities. Multilevel facet hypertrophy in the lumbar spine. IMPRESSION: 1. Rounded heterogeneous hyperdense material within the bladder lumen measuring 7.2 x 6.3 x 9.3 cm, diminished in size from prior CT. Suspect blood clot. This is elevated above a rounded soft tissue density at the bladder base which is contiguous with the prostate gland. 2. Enlarged prostate gland which is difficult to delineate from soft tissue density extending into the base of the bladder. It is unclear if this represents a primary bladder or prostatic lesion. There is bladder wall thickening and trabeculation, suggesting chronic bladder outlet obstruction. 3. No hydronephrosis. 4. Abnormal thickening about the neck of the gallbladder again seen spanning at least 2.5 cm. There is abnormal thickening about Phrygian cap of the gallbladder. Given persistence from prior exam, mucosal lesion or neoplasm is considered. Recommend nonemergent contrast enhanced MRI of the gallbladder. Aortic Atherosclerosis (ICD10-I70.0). Electronically Signed   By: Keith Rake M.D.   On: 08/12/2020 15:55   US PELVIS LIMITED (TRANSABDOMINAL ONLY)  Result Date: 08/12/2020 CLINICAL DATA:  Hematuria.  Assess for continued clot burden. EXAM: LIMITED ULTRASOUND OF PELVIS TECHNIQUE: Limited transabdominal ultrasound examination of the pelvis was performed. COMPARISON:  Abdominopelvic CT earlier today. Lateral chest sound 06/30/2020 FINDINGS: Interval placement of Foley catheter within the urinary bladder. Heterogeneous material within the bladder lumen, difficult to delineate intraluminal material from bladder wall as the bladder is decompressed. Bladder base lesion on CT is difficult to accurately visualize, the Foley appears elevated. IMPRESSION: Foley catheter decompresses the urinary bladder. Heterogeneous material within the bladder which is difficult to delineate from adjacent wall thickening. Prostate appears elevated due to enlarged prostate and/or  bladder base lesion, seen on CT. Electronically Signed   By: Keith Rake M.D.   On: 08/12/2020 18:20    Pending Labs [employer] (From admission, onward)          Start     Ordered   Signed and Held  Comprehensive metabolic panel  Tomorrow morning,   R        Signed and Held   Signed and Held  Hemoglobin and hematocrit, blood  Now then every  6 hours,   R      Signed and Held          Vitals/Pain Today's Vitals   08/12/20 1900 08/12/20 1930 08/12/20 2000 08/12/20 2030  BP: 128/67 133/68 (!) 142/70 (!) 144/70  Pulse: 62 60 63 63  Resp: 15 14 14 12   Temp:      TempSrc:      SpO2: 100% 100% 100% 100%  Weight:      Height:      PainSc:        Isolation Precautions No active isolations  Medications Medications  0.9 %  sodium chloride infusion (0 mL/hr Intravenous Stopped 08/12/20 2046)    Mobility walks

## 2020-08-12 NOTE — ED Notes (Signed)
Urology is at the bedside

## 2020-08-12 NOTE — ED Triage Notes (Signed)
Pt had surgery with urology 2 weeks ago and was told to come to ED if he notices blood in urine. He noticed blood in his urine 2 nights ago. The amount of blood has increased.

## 2020-08-12 NOTE — H&P (Addendum)
History and Physical    Eddie Mendoza TGP:[id number] DOB: 1950-04-14 DOA: 08/12/2020  PCP: [facility]  Patient coming from: Home  Chief Complaint: hematuria  HPI: Eddie Mendoza is a 70 y.o. male with medical history significant of bladder tumor, HTN. Presenting with gross hematuria. He reports that he saw blood in his urine 2 days ago. He has had some crampy, burning w/ urination during this time. He didn't have any fevers, N/V or abdominal pain. His symptoms continued through this morning. So he called his PCP and urologist. They recommended that he come to the ED. He reports that he had a recent urological procedure with [facility]. He denies any other aggravating or alleviating factors.   ED Course: It was noted that he had a recent transurethral resection of a bladder tumor. [facility] was consulted. It was noted that his Hgb was 6.7. He was ordered 1 units pRBCs. [facility] was called for admission.   Review of Systems:  Denies CP, palpitations, dyspnea, fevers, N/V, ab pain, syncopal episodes. Review of systems is otherwise negative for all not mentioned in HPI.   PMHx Past Medical History:  Diagnosis Date  . Hypertension     PSHx Past Surgical History:  Procedure Laterality Date  . TRANSURETHRAL RESECTION OF BLADDER TUMOR N/A 06/30/2020   Procedure:  Almyra Free OF BLEEDING, CLOT EVACUATION;  Surgeon: Ardis Hughs, MD;  Location: [facility];  Service: Urology;  Laterality: N/A;    SocHx +tobacco use, +alcohol use, +methamphetamine use  No Known Allergies  FamHx No family history on file.  Prior to Admission medications   Medication Sig Start Date End Date Taking? Authorizing Provider  finasteride (PROSCAR) 5 MG tablet Take 1 tablet (5 mg total) by mouth daily. 07/02/20   Patrecia Pour, MD  lidocaine (XYLOCAINE) 5 % ointment Apply 1 application topically 4 (four) times daily as needed for mild pain or moderate pain. 04/15/20   [provider]  polyethylene glycol (MIRALAX / GLYCOLAX) 17 g packet Take 17 g by mouth daily as needed for mild constipation. 06/22/20   Pokhrel, Corrie Mckusick, MD  tamsulosin (FLOMAX) 0.4 MG CAPS capsule Take 1 capsule (0.4 mg total) by mouth daily. 07/01/20   Patrecia Pour, MD    Physical Exam: Vitals:   08/12/20 1246 08/12/20 1429 08/12/20 1430 08/12/20 1500  BP: 125/70 132/70 122/73 (!) 125/55  Pulse: 93 78 83 78  Resp: 18  14 15   Temp: 98.2 F (36.8 C)     TempSrc: Oral     SpO2: 100% 100% 100% 100%  Weight: 99.8 kg     Height: 5\' 10"  (1.778 m)       General: 70 y.o. male resting in bed in NAD Eyes: PERRL, normal sclera ENMT: Nares patent w/o discharge, orophaynx clear, dentition normal, ears w/o discharge/lesions/ulcers Neck: Supple, trachea midline Cardiovascular: RRR, +S1, S2, no m/g/r, equal pulses throughout Respiratory: CTABL, no w/r/r, normal WOB GI: BS+, NDNT, no masses noted, no organomegaly noted MSK: No e/c/c Skin: No rashes, bruises, ulcerations noted Neuro: A&O x 3, no focal deficits Psyc: Appropriate interaction and affect, calm/cooperative  Labs on Admission: I have personally reviewed following labs and imaging studies  CBC: Recent Labs  Lab 08/12/20 1347  WBC 10.1  HGB 6.7*  HCT 22.8*  MCV 82.9  PLT 309*   Basic Metabolic Panel: Recent Labs  Lab 08/12/20 1347  NA 136  K 3.7  CL 104  CO2 23  GLUCOSE 123*  BUN 19  CREATININE 1.16  CALCIUM 9.0   GFR: Estimated Creatinine Clearance: 69.1 mL/min (by C-G formula based on SCr of 1.16 mg/dL). Liver Function Tests: No results for input(s): AST, ALT, ALKPHOS, BILITOT, PROT, ALBUMIN in the last 168 hours. No results for input(s): LIPASE, AMYLASE in the last 168 hours. No results for input(s): AMMONIA in the last 168 hours. Coagulation Profile: No results for input(s): INR, PROTIME in the last 168 hours. Cardiac Enzymes: No results for input(s): CKTOTAL, CKMB, CKMBINDEX, TROPONINI in the last 168 hours. BNP  (last 3 results) No results for input(s): PROBNP in the last 8760 hours. HbA1C: No results for input(s): HGBA1C in the last 72 hours. CBG: No results for input(s): GLUCAP in the last 168 hours. Lipid Profile: No results for input(s): CHOL, HDL, LDLCALC, TRIG, CHOLHDL, LDLDIRECT in the last 72 hours. Thyroid Function Tests: No results for input(s): TSH, T4TOTAL, FREET4, T3FREE, THYROIDAB in the last 72 hours. Anemia Panel: No results for input(s): VITAMINB12, FOLATE, FERRITIN, TIBC, IRON, RETICCTPCT in the last 72 hours. Urine analysis:    Component Value Date/Time   COLORURINE RED (A) 08/12/2020 1406   APPEARANCEUR TURBID (A) 08/12/2020 1406   LABSPEC  08/12/2020 1406    TEST NOT REPORTED DUE TO COLOR INTERFERENCE OF URINE PIGMENT   PHURINE  08/12/2020 1406    TEST NOT REPORTED DUE TO COLOR INTERFERENCE OF URINE PIGMENT   GLUCOSEU (A) 08/12/2020 1406    TEST NOT REPORTED DUE TO COLOR INTERFERENCE OF URINE PIGMENT   HGBUR (A) 08/12/2020 1406    TEST NOT REPORTED DUE TO COLOR INTERFERENCE OF URINE PIGMENT   BILIRUBINUR (A) 08/12/2020 1406    TEST NOT REPORTED DUE TO COLOR INTERFERENCE OF URINE PIGMENT   KETONESUR (A) 08/12/2020 1406    TEST NOT REPORTED DUE TO COLOR INTERFERENCE OF URINE PIGMENT   PROTEINUR (A) 08/12/2020 1406    TEST NOT REPORTED DUE TO COLOR INTERFERENCE OF URINE PIGMENT   UROBILINOGEN 0.2 08/15/2009 1010   NITRITE (A) 08/12/2020 1406    TEST NOT REPORTED DUE TO COLOR INTERFERENCE OF URINE PIGMENT   LEUKOCYTESUR (A) 08/12/2020 1406    TEST NOT REPORTED DUE TO COLOR INTERFERENCE OF URINE PIGMENT    Radiological Exams on Admission: CT ABDOMEN PELVIS WO CONTRAST  Result Date: 08/12/2020 CLINICAL DATA:  Hematuria. Lower abdominal pain for 2 weeks. 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 EXAM: CT ABDOMEN AND PELVIS WITHOUT CONTRAST TECHNIQUE: Multidetector CT  imaging of the abdomen and pelvis was performed following the standard protocol without IV contrast. COMPARISON:  CT 06/20/2020 FINDINGS: Lower chest: Clear lung bases without pleural effusion or focal airspace disease. Normal heart size. Hepatobiliary: No evidence of focal liver lesion on this noncontrast exam. Gallbladder is physiologically distended. There is abnormal thickening about the neck of the gallbladder again spanning at least 2.5 cm, also seen on prior. There is abnormal thickening about Phrygian cap of the gallbladder, series 4, image 45. No pericholecystic fat stranding. Proximal common bile duct appears prominent, but is not well assessed on this noncontrast exam. Distal common bile duct is nondistended. Pancreas: No ductal dilatation or inflammation. Spleen: Normal in size without focal abnormality. Adrenals/Urinary Tract: Normal adrenal glands. No hydronephrosis or renal calculi. No perinephric edema. No evidence of focal renal lesion on this noncontrast exam. Rounded heterogeneous hyperdense material within the bladder lumen spans approximately 7.2 x 6.3 x 9.3 cm. This has diminished  in size from prior CT. This appears elevated above a rounded soft tissue density arising from the bladder base, which is may be with the prostate. Bladder wall thickening and trabeculation. No significant perivesicular fat stranding. Stomach/Bowel: Decompressed stomach. Unremarkable small bowel. No obstruction or inflammation. Normal appendix. Moderate colonic stool burden with mild colonic tortuosity. No colonic inflammation. Vascular/Lymphatic: Aortic atherosclerosis. No aneurysm. No enlarged lymph nodes in the abdomen or pelvis. Reproductive: Enlarged prostate gland which is difficult to delineate from soft tissue density extending into the base of the bladder, less well-defined on this noncontrast exam the previous. Other: No free air or ascites. Musculoskeletal: There are no acute or suspicious osseous  abnormalities. Multilevel facet hypertrophy in the lumbar spine. IMPRESSION: 1. Rounded heterogeneous hyperdense material within the bladder lumen measuring 7.2 x 6.3 x 9.3 cm, diminished in size from prior CT. Suspect blood clot. This is elevated above a rounded soft tissue density at the bladder base which is contiguous with the prostate gland. 2. Enlarged prostate gland which is difficult to delineate from soft tissue density extending into the base of the bladder. It is unclear if this represents a primary bladder or prostatic lesion. There is bladder wall thickening and trabeculation, suggesting chronic bladder outlet obstruction. 3. No hydronephrosis. 4. Abnormal thickening about the neck of the gallbladder again seen spanning at least 2.5 cm. There is abnormal thickening about Phrygian cap of the gallbladder. Given persistence from prior exam, mucosal lesion or neoplasm is considered. Recommend nonemergent contrast enhanced MRI of the gallbladder. Aortic Atherosclerosis (ICD10-I70.0). Electronically Signed   By: Keith Rake M.D.   On: 08/12/2020 15:55    EKG: None obtained  Assessment/Plan Hematuria     - admit to inpt, tele     - follow H&H; transfuse 1 unit pRBCs     - urology to see, awaiting final recs     - CLD tonight, NPOpMN incase procedure tomorrow   BPH     - continue home meds  Hx of HTN     - med rec updated, resume home meds  Tobacco abuse Methamphetamine abuse     - counsel again further use  DVT prophylaxis: SCDs  Code Status: FULL  Family Communication: None at bedside  Consults called: EDP spoke with urology   Status is: Inpatient  Remains inpatient appropriate because:Inpatient level of care appropriate due to severity of illness   Dispo: The patient is from: Home              Anticipated d/c is to: Home              Patient currently is not medically stable to d/c.   Difficult to place patient No  Time spent coordinating admission: 75  minutes  [facility]  If 7PM-7AM, please contact night-coverage [email or web address]  08/12/2020, 4:16 PM

## 2020-08-12 NOTE — ED Provider Notes (Signed)
[facility] DEPT Provider Note   CSN: [id number] Arrival date & time: 08/12/20  1224     History Chief Complaint  Patient presents with  . Hematuria    Eddie Mendoza is a 70 y.o. male.  Patient presents to the emergency department for evaluation of hematuria.  Patient was admitted for cystoscopy on April 12 due to hematuria.  He was found to have bleeding of the prostatic urethra and median lobe.  This was controlled.  Patient followed up with urology and then had routine labs drawn by his [place] provider.  He was found to have low hemoglobin and presented to the emergency department on May 11 at which time he had 1 unit of packed red cells and discharged.  Patient states that over the past 2 days he has had red blood passed again in the urine with small clots.  He denies pain.  He is not lightheaded or short of breath with activity.  Overall he states he is feeling good. The onset of this condition was acute. The course is constant. Aggravating factors: none. Alleviating factors: none.          Past Medical History:  Diagnosis Date  . Hypertension     Patient Active Problem List   Diagnosis Date Noted  .   06/28/2020  . Acute blood loss anemia 06/28/2020  . COVID-19 virus infection 06/28/2020  . Hematuria 06/20/2020  . HTN (hypertension) 06/20/2020    Past Surgical History:  Procedure Laterality Date  . TRANSURETHRAL RESECTION OF BLADDER TUMOR N/A 06/30/2020   Procedure:  Almyra Free OF BLEEDING, CLOT EVACUATION;  Surgeon: Ardis Hughs, MD;  Location: [facility];  Service: Urology;  Laterality: N/A;       No family history on file.  Social History   Tobacco Use  . Smoking status: Current Every Day Smoker    Packs/day: 0.50    Years: 30.00    Pack years: 15.00    Types: Cigarettes  . Smokeless tobacco: Never Used  Substance Use Topics  . Alcohol use: Yes    Alcohol/week: 3.0 standard drinks    Types: 3 Shots of liquor per week   . Drug use: No    Home Medications Prior to Admission medications   Medication Sig Start Date End Date Taking? Authorizing Provider  finasteride (PROSCAR) 5 MG tablet Take 1 tablet (5 mg total) by mouth daily. 07/02/20   Patrecia Pour, MD  lidocaine (XYLOCAINE) 5 % ointment Apply 1 application topically 4 (four) times daily as needed for mild pain or moderate pain. 04/15/20   [provider]  polyethylene glycol (MIRALAX / GLYCOLAX) 17 g packet Take 17 g by mouth daily as needed for mild constipation. 06/22/20   Pokhrel, Corrie Mckusick, MD  tamsulosin (FLOMAX) 0.4 MG CAPS capsule Take 1 capsule (0.4 mg total) by mouth daily. 07/01/20   Patrecia Pour, MD    Allergies    Patient has no known allergies.  Review of Systems   Review of Systems  Constitutional: Negative for fatigue and fever.  HENT: Positive for ear pain (can feel heart beat in ears). Negative for rhinorrhea and sore throat.   Eyes: Negative for redness.  Respiratory: Negative for cough and shortness of breath.   Cardiovascular: Negative for chest pain.  Gastrointestinal: Negative for abdominal pain, diarrhea, nausea and vomiting.  Genitourinary: Positive for hematuria. Negative for dysuria.  Musculoskeletal: Negative for myalgias.  Skin: Negative for rash.  Neurological: Negative for headaches.  Physical Exam Updated Vital Signs BP 125/70 (BP Location: Left Arm)   Pulse 93   Temp 98.2 F (36.8 C) (Oral)   Resp 18   Ht 5\' 10"  (1.778 m)   Wt 99.8 kg   SpO2 100%   BMI 31.57 kg/m   Physical Exam Vitals and nursing note reviewed.  Constitutional:      Appearance: He is well-developed.  HENT:     Head: Normocephalic and atraumatic.     Right Ear: Tympanic membrane, ear canal and external ear normal.     Left Ear: Tympanic membrane, ear canal and external ear normal.  Eyes:     General:        Right eye: No discharge.        Left eye: No discharge.     Conjunctiva/sclera: Conjunctivae normal.   Cardiovascular:     Rate and Rhythm: Normal rate and regular rhythm.     Heart sounds: Normal heart sounds.  Pulmonary:     Effort: Pulmonary effort is normal.     Breath sounds: Normal breath sounds.  Abdominal:     Palpations: Abdomen is soft.     Tenderness: There is no abdominal tenderness.  Musculoskeletal:     Cervical back: Normal range of motion and neck supple.  Skin:    General: Skin is warm and dry.  Neurological:     Mental Status: He is alert.     ED Results / Procedures / Treatments   Labs (all labs ordered are listed, but only abnormal results are displayed) Labs Reviewed  CBC - Abnormal; Notable for the following components:      Result Value   RBC 2.75 (*)    Hemoglobin 6.7 (*)    HCT 22.8 (*)    MCH 24.4 (*)    MCHC 29.4 (*)    RDW 16.1 (*)    Platelets 474 (*)    All other components within normal limits  BASIC METABOLIC PANEL - Abnormal; Notable for the following components:   Glucose, Bld 123 (*)    All other components within normal limits  URINALYSIS, ROUTINE W REFLEX MICROSCOPIC - Abnormal; Notable for the following components:   Color, Urine RED (*)    APPearance TURBID (*)    Glucose, UA   (*)    Value: TEST NOT REPORTED DUE TO COLOR INTERFERENCE OF URINE PIGMENT   Hgb urine dipstick   (*)    Value: TEST NOT REPORTED DUE TO COLOR INTERFERENCE OF URINE PIGMENT   Bilirubin Urine   (*)    Value: TEST NOT REPORTED DUE TO COLOR INTERFERENCE OF URINE PIGMENT   Ketones, ur   (*)    Value: TEST NOT REPORTED DUE TO COLOR INTERFERENCE OF URINE PIGMENT   Protein, ur   (*)    Value: TEST NOT REPORTED DUE TO COLOR INTERFERENCE OF URINE PIGMENT   Nitrite   (*)    Value: TEST NOT REPORTED DUE TO COLOR INTERFERENCE OF URINE PIGMENT   Leukocytes,Ua   (*)    Value: TEST NOT REPORTED DUE TO COLOR INTERFERENCE OF URINE PIGMENT   All other components within normal limits  URINALYSIS, MICROSCOPIC (REFLEX) - Abnormal; Notable for the following components:    Non Squamous Epithelial 0-5 (*)    All other components within normal limits  TYPE AND SCREEN  PREPARE RBC (CROSSMATCH)    EKG None  Radiology CT ABDOMEN PELVIS WO CONTRAST  Result Date: 08/12/2020 CLINICAL DATA:  Hematuria. Lower abdominal pain  for 2 weeks. 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 EXAM: CT ABDOMEN AND PELVIS WITHOUT CONTRAST TECHNIQUE: Multidetector CT imaging of the abdomen and pelvis was performed following the standard protocol without IV contrast. COMPARISON:  CT 06/20/2020 FINDINGS: Lower chest: Clear lung bases without pleural effusion or focal airspace disease. Normal heart size. Hepatobiliary: No evidence of focal liver lesion on this noncontrast exam. Gallbladder is physiologically distended. There is abnormal thickening about the neck of the gallbladder again spanning at least 2.5 cm, also seen on prior. There is abnormal thickening about Phrygian cap of the gallbladder, series 4, image 45. No pericholecystic fat stranding. Proximal common bile duct appears prominent, but is not well assessed on this noncontrast exam. Distal common bile duct is nondistended. Pancreas: No ductal dilatation or inflammation. Spleen: Normal in size without focal abnormality. Adrenals/Urinary Tract: Normal adrenal glands. No hydronephrosis or renal calculi. No perinephric edema. No evidence of focal renal lesion on this noncontrast exam. Rounded heterogeneous hyperdense material within the bladder lumen spans approximately 7.2 x 6.3 x 9.3 cm. This has diminished in size from prior CT. This appears elevated above a rounded soft tissue density arising from the bladder base, which is may be with the prostate. Bladder wall thickening and trabeculation. No significant perivesicular fat stranding. Stomach/Bowel: Decompressed stomach. Unremarkable small bowel. No obstruction or inflammation. Normal  appendix. Moderate colonic stool burden with mild colonic tortuosity. No colonic inflammation. Vascular/Lymphatic: Aortic atherosclerosis. No aneurysm. No enlarged lymph nodes in the abdomen or pelvis. Reproductive: Enlarged prostate gland which is difficult to delineate from soft tissue density extending into the base of the bladder, less well-defined on this noncontrast exam the previous. Other: No free air or ascites. Musculoskeletal: There are no acute or suspicious osseous abnormalities. Multilevel facet hypertrophy in the lumbar spine. IMPRESSION: 1. Rounded heterogeneous hyperdense material within the bladder lumen measuring 7.2 x 6.3 x 9.3 cm, diminished in size from prior CT. Suspect blood clot. This is elevated above a rounded soft tissue density at the bladder base which is contiguous with the prostate gland. 2. Enlarged prostate gland which is difficult to delineate from soft tissue density extending into the base of the bladder. It is unclear if this represents a primary bladder or prostatic lesion. There is bladder wall thickening and trabeculation, suggesting chronic bladder outlet obstruction. 3. No hydronephrosis. 4. Abnormal thickening about the neck of the gallbladder again seen spanning at least 2.5 cm. There is abnormal thickening about Phrygian cap of the gallbladder. Given persistence from prior exam, mucosal lesion or neoplasm is considered. Recommend nonemergent contrast enhanced MRI of the gallbladder. Aortic Atherosclerosis (ICD10-I70.0). Electronically Signed   By: Keith Rake M.D.   On: 08/12/2020 15:55    Procedures Procedures   Medications Ordered in ED Medications  0.9 %  sodium chloride infusion (has no administration in time range)    ED Course  I have reviewed the triage vital signs and the nursing notes.  Pertinent labs & imaging results that were available during my care of the patient were reviewed by me and considered in my medical decision making (see chart  for details).  Patient seen and examined. Work-up initiated. Will need to recheck hgb and UA.   Vital signs reviewed and are as follows: BP 125/70 (BP Location: Left Arm)   Pulse 93   Temp 98.2 F (36.8 C) (Oral)   Resp 18   Ht 5\' 10"  (1.778  m)   Wt 99.8 kg   SpO2 100%   BMI 31.57 kg/m   Hemoglobin has dropped again and is now 6.7.  Patient discussed with and seen by Dr. Francia Greaves.  I discussed the patient case with Dr. Gloriann Loan of urology.  He request that I repeat CT imaging.  They will consult on patient.  They are in agreement with plan for admission.  CRITICAL CARE Performed by: Carlisle Cater PA-C Total critical care time: 35 minutes Critical care time was exclusive of separately billable procedures and treating other patients. Critical care was necessary to treat or prevent imminent or life-threatening deterioration. Critical care was time spent personally by me on the following activities: development of treatment plan with patient and/or surrogate as well as nursing, discussions with consultants, evaluation of patient's response to treatment, examination of patient, obtaining history from patient or surrogate, ordering and performing treatments and interventions, ordering and review of laboratory studies, ordering and review of radiographic studies, pulse oximetry and re-evaluation of patient's condition.     MDM Rules/Calculators/A&P                          Plan admit for blood transfusion due to anemia due to GU bleeding.  Large clot noted on CT scan.  No signs of sepsis.  Urology will consult, hospitalist to admit.   Final Clinical Impression(s) / ED Diagnoses Final diagnoses:  Gross hematuria  Blood loss anemia    Rx / DC Orders ED Discharge Orders    None       Carlisle Cater, PA-C 08/12/20 1613    Valarie Merino, MD 08/19/20 [phone]

## 2020-08-12 NOTE — Consult Note (Signed)
Urology Consult   Physician requesting consult: Cherylann Ratel MD  Reason for consult: hematuria  History of Present Illness: Eddie Mendoza is a 70 y.o. M with history of BPH, hematuria and clot retention who presents again for gross hematuria and downtrending Hgb. He was seen a few weeks ago 4/12 by urology for the same symptoms and required cysto, clot evacuation who showed very large prostate, high riding intravesical median lobe and no bladder abnormalities. Hematuria is thought to be secondary to bleeding prostate. He was seen again on 5/11 for low Hgb requiring transfusion but he was urinating well at the time so was not seen by urology. He reports that he has not had frank hematuria since that time until the last few days where he was passing many clots, although he feels as though he is emptying is bladder. CT scan with large clot in bladder. Hgb today in ED 6.7.    Past Medical History:  Diagnosis Date  . Hypertension     Past Surgical History:  Procedure Laterality Date  . TRANSURETHRAL RESECTION OF BLADDER TUMOR N/A 06/30/2020   Procedure:  Almyra Free OF BLEEDING, CLOT EVACUATION;  Surgeon: Ardis Hughs, MD;  Location: [facility];  Service: Urology;  Laterality: N/A;    Current Hospital Medications:  Home Meds:  No current facility-administered medications on file prior to encounter.   Current Outpatient Medications on File Prior to Encounter  Medication Sig Dispense Refill  . chlorthalidone (HYGROTON) 25 MG tablet Take 1 tablet by mouth daily.    . finasteride (PROSCAR) 5 MG tablet Take 1 tablet (5 mg total) by mouth daily. 30 tablet 0  . lidocaine (XYLOCAINE) 5 % ointment Apply 1 application topically 4 (four) times daily as needed for mild pain or moderate pain.    Marland Kitchen losartan (COZAAR) 100 MG tablet Take 50 mg by mouth daily.    . polyethylene glycol (MIRALAX / GLYCOLAX) 17 g packet Take 17 g by mouth daily as needed for mild constipation. 14 each 0  . tamsulosin  (FLOMAX) 0.4 MG CAPS capsule Take 1 capsule (0.4 mg total) by mouth daily. 30 capsule 0     Scheduled Meds: Continuous Infusions: . sodium chloride     PRN Meds:.  Allergies: No Known Allergies  No family history on file.  Social History:  reports that he has been smoking cigarettes. He has a 15.00 pack-year smoking history. He has never used smokeless tobacco. He reports current alcohol use of about 3.0 standard drinks of alcohol per week. He reports that he does not use drugs.  ROS: A complete review of systems was performed.  All systems are negative except for pertinent findings as noted.  Physical Exam:  Vital signs in last 24 hours: Temp:  [98.2 F (36.8 C)] 98.2 F (36.8 C) (05/25 1246) Pulse Rate:  [64-93] 64 (05/25 1700) Resp:  [10-18] 10 (05/25 1700) BP: (122-132)/(55-73) 124/62 (05/25 1700) SpO2:  [100 %] 100 % (05/25 1700) Weight:  [99.8 kg] 99.8 kg (05/25 1246) Constitutional:  Alert and oriented, No acute distress Cardiovascular: Regular rate and rhythm, No JVD Respiratory: Normal respiratory effort, Lungs clear bilaterally GI: Abdomen is soft, nontender, nondistended, no abdominal masses GU: No CVA tenderness. Catheter in place following placement by urology with clear cherry urine draining. Lymphatic: No lymphadenopathy Neurologic: Grossly intact, no focal deficits Psychiatric: Normal mood and affect  Laboratory Data:  Recent Labs    08/12/20 1347  WBC 10.1  HGB 6.7*  HCT 22.8*  PLT  474*    Recent Labs    08/12/20 1347  NA 136  K 3.7  CL 104  GLUCOSE 123*  BUN 19  CALCIUM 9.0  CREATININE 1.16     Results for orders placed or performed during the hospital encounter of 08/12/20 (from the past 24 hour(s))  CBC     Status: Abnormal   Collection Time: 08/12/20  1:47 PM  Result Value Ref Range   WBC 10.1 4.0 - 10.5 K/uL   RBC 2.75 (L) 4.22 - 5.81 MIL/uL   Hemoglobin 6.7 (LL) 13.0 - 17.0 g/dL   HCT 22.8 (L) 39.0 - 52.0 %   MCV 82.9 80.0 -  100.0 fL   MCH 24.4 (L) 26.0 - 34.0 pg   MCHC 29.4 (L) 30.0 - 36.0 g/dL   RDW 16.1 (H) 11.5 - 15.5 %   Platelets 474 (H) 150 - 400 K/uL   nRBC 0.0 0.0 - 0.2 %  Basic metabolic panel     Status: Abnormal   Collection Time: 08/12/20  1:47 PM  Result Value Ref Range   Sodium 136 135 - 145 mmol/L   Potassium 3.7 3.5 - 5.1 mmol/L   Chloride 104 98 - 111 mmol/L   CO2 23 22 - 32 mmol/L   Glucose, Bld 123 (H) 70 - 99 mg/dL   BUN 19 8 - 23 mg/dL   Creatinine, Ser 1.16 0.61 - 1.24 mg/dL   Calcium 9.0 8.9 - 10.3 mg/dL   GFR, Estimated >60 >60 mL/min   Anion gap 9 5 - 15  Urinalysis, Routine w reflex microscopic Urine, Clean Catch     Status: Abnormal   Collection Time: 08/12/20  2:06 PM  Result Value Ref Range   Color, Urine RED (A) YELLOW   APPearance TURBID (A) CLEAR   Specific Gravity, Urine  1.005 - 1.030    TEST NOT REPORTED DUE TO COLOR INTERFERENCE OF URINE PIGMENT   pH  5.0 - 8.0    TEST NOT REPORTED DUE TO COLOR INTERFERENCE OF URINE PIGMENT   Glucose, UA (A) NEGATIVE mg/dL    TEST NOT REPORTED DUE TO COLOR INTERFERENCE OF URINE PIGMENT   Hgb urine dipstick (A) NEGATIVE    TEST NOT REPORTED DUE TO COLOR INTERFERENCE OF URINE PIGMENT   Bilirubin Urine (A) NEGATIVE    TEST NOT REPORTED DUE TO COLOR INTERFERENCE OF URINE PIGMENT   Ketones, ur (A) NEGATIVE mg/dL    TEST NOT REPORTED DUE TO COLOR INTERFERENCE OF URINE PIGMENT   Protein, ur (A) NEGATIVE mg/dL    TEST NOT REPORTED DUE TO COLOR INTERFERENCE OF URINE PIGMENT   Nitrite (A) NEGATIVE    TEST NOT REPORTED DUE TO COLOR INTERFERENCE OF URINE PIGMENT   Leukocytes,Ua (A) NEGATIVE    TEST NOT REPORTED DUE TO COLOR INTERFERENCE OF URINE PIGMENT  Urinalysis, Microscopic (reflex)     Status: Abnormal   Collection Time: 08/12/20  2:06 PM  Result Value Ref Range   RBC / HPF >50 0 - 5 RBC/hpf   WBC, UA 0-5 0 - 5 WBC/hpf   Bacteria, UA NONE SEEN NONE SEEN   Squamous Epithelial / LPF 0-5 0 - 5   Non Squamous Epithelial 0-5 (A)  NONE SEEN   WBC Clumps NONE SEEN    Mucus NONE SEEN   Type and screen     Status: None (Preliminary result)   Collection Time: 08/12/20  3:02 PM  Result Value Ref Range   ABO/RH(D) A POS  Antibody Screen NEG    Sample Expiration 08/15/2020,2359    Unit Number [id number]    Blood Component Type RED CELLS,LR    Unit division 00    Status of Unit ALLOCATED    Transfusion Status OK TO TRANSFUSE    Crossmatch Result      Compatible Performed at [address] [facility] [address], [place], [place] [address]   Prepare RBC (crossmatch)     Status: None   Collection Time: 08/12/20  3:30 PM  Result Value Ref Range   Order Confirmation      ORDER PROCESSED BY BLOOD BANK Performed at [facility] [address], [place], [place] [address]    No results found for this or any previous visit (from the past 240 hour(s)).  Renal Function: Recent Labs    08/12/20 1347  CREATININE 1.16   Estimated Creatinine Clearance: 69.1 mL/min (by C-G formula based on SCr of 1.16 mg/dL).  Radiologic Imaging: CT ABDOMEN PELVIS WO CONTRAST  Result Date: 08/12/2020 CLINICAL DATA:  Hematuria. Lower abdominal pain for 2 weeks. 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 EXAM: CT ABDOMEN AND PELVIS WITHOUT CONTRAST TECHNIQUE: Multidetector CT imaging of the abdomen and pelvis was performed following the standard protocol without IV contrast. COMPARISON:  CT 06/20/2020 FINDINGS: Lower chest: Clear lung bases without pleural effusion or focal airspace disease. Normal heart size. Hepatobiliary: No evidence of focal liver lesion on this noncontrast exam. Gallbladder is physiologically distended. There is abnormal thickening about the neck of the gallbladder again spanning at least 2.5 cm, also seen on prior. There is abnormal thickening about Phrygian cap of the gallbladder, series  4, image 45. No pericholecystic fat stranding. Proximal common bile duct appears prominent, but is not well assessed on this noncontrast exam. Distal common bile duct is nondistended. Pancreas: No ductal dilatation or inflammation. Spleen: Normal in size without focal abnormality. Adrenals/Urinary Tract: Normal adrenal glands. No hydronephrosis or renal calculi. No perinephric edema. No evidence of focal renal lesion on this noncontrast exam. Rounded heterogeneous hyperdense material within the bladder lumen spans approximately 7.2 x 6.3 x 9.3 cm. This has diminished in size from prior CT. This appears elevated above a rounded soft tissue density arising from the bladder base, which is may be with the prostate. Bladder wall thickening and trabeculation. No significant perivesicular fat stranding. Stomach/Bowel: Decompressed stomach. Unremarkable small bowel. No obstruction or inflammation. Normal appendix. Moderate colonic stool burden with mild colonic tortuosity. No colonic inflammation. Vascular/Lymphatic: Aortic atherosclerosis. No aneurysm. No enlarged lymph nodes in the abdomen or pelvis. Reproductive: Enlarged prostate gland which is difficult to delineate from soft tissue density extending into the base of the bladder, less well-defined on this noncontrast exam the previous. Other: No free air or ascites. Musculoskeletal: There are no acute or suspicious osseous abnormalities. Multilevel facet hypertrophy in the lumbar spine. IMPRESSION: 1. Rounded heterogeneous hyperdense material within the bladder lumen measuring 7.2 x 6.3 x 9.3 cm, diminished in size from prior CT. Suspect blood clot. This is elevated above a rounded soft tissue density at the bladder base which is contiguous with the prostate gland. 2. Enlarged prostate gland which is difficult to delineate from soft tissue density extending into the base of the bladder. It is unclear if this represents a primary bladder or prostatic lesion. There is  bladder wall thickening and trabeculation, suggesting chronic bladder outlet obstruction. 3. No hydronephrosis. 4.  Abnormal thickening about the neck of the gallbladder again seen spanning at least 2.5 cm. There is abnormal thickening about Phrygian cap of the gallbladder. Given persistence from prior exam, mucosal lesion or neoplasm is considered. Recommend nonemergent contrast enhanced MRI of the gallbladder. Aortic Atherosclerosis (ICD10-I70.0). Electronically Signed   By: Keith Rake M.D.   On: 08/12/2020 15:55    I independently reviewed the above imaging studies.  Impression/Recommendation 70yo M with BPH, recurrent frank hematuria and clot retention who presents for continued hematuria, downtrending Hgb.  Urology placed 24Fr 3way catheter at bedside without issue, 10cc in balloon, with return of 300cc of thick red urine and he was hand irrigated with return of 100cc of old clot to clear cherry urine. CBI was not started. Patient tolerated well and felt much better after irrigation.  - recommend RUS to assess remaining bladder clot burden following irrigation - NPO at midnight for possible intervention tmrw for cysto, clot evacuation - cont catheter to drainage at this time, nurses can irrigate as needed if not draining. If still not draining despite flushes or there is worsening hematuria with large amount of clots, please contact urology as this may indicate need for more aggressive irrigation and initiation of CBI - urology will continue to follow and monitor hematuria  Trenika Hudson [facility] 08/12/2020, 5:45 PM

## 2020-08-12 NOTE — ED Notes (Signed)
Patient denies pain and is resting comfortably.  

## 2020-08-12 NOTE — ED Notes (Signed)
Returns from CT 

## 2020-08-13 ENCOUNTER — Inpatient Hospital Stay (HOSPITAL_COMMUNITY): Payer: No Typology Code available for payment source | Admitting: Anesthesiology

## 2020-08-13 ENCOUNTER — Encounter (HOSPITAL_COMMUNITY): Payer: Self-pay | Admitting: Internal Medicine

## 2020-08-13 ENCOUNTER — Encounter (HOSPITAL_COMMUNITY)
Admission: EM | Disposition: A | Discharge status: Home / Self Care | Payer: Self-pay | Source: Home / Self Care | Attending: Family Medicine

## 2020-08-13 DIAGNOSIS — R932 Abnormal findings on diagnostic imaging of liver and biliary tract: Secondary | ICD-10-CM

## 2020-08-13 DIAGNOSIS — R310 Gross hematuria: Secondary | ICD-10-CM

## 2020-08-13 DIAGNOSIS — D62 Acute posthemorrhagic anemia: Secondary | ICD-10-CM

## 2020-08-13 HISTORY — DX: Abnormal findings on diagnostic imaging of liver and biliary tract: R93.2

## 2020-08-13 HISTORY — PX: TRANSURETHRAL RESECTION OF BLADDER TUMOR: SHX2575

## 2020-08-13 LAB — RESP PANEL BY RT-PCR (FLU A&B, COVID) ARPGX2
Influenza A by PCR: NEGATIVE
Influenza B by PCR: NEGATIVE
SARS Coronavirus 2 by RT PCR: NEGATIVE

## 2020-08-13 LAB — COMPREHENSIVE METABOLIC PANEL
ALT: 8 U/L (ref 0–44)
AST: 10 U/L — ABNORMAL LOW (ref 15–41)
Albumin: 3.3 g/dL — ABNORMAL LOW (ref 3.5–5.0)
Alkaline Phosphatase: 50 U/L (ref 38–126)
Anion gap: 7 (ref 5–15)
BUN: 18 mg/dL (ref 8–23)
CO2: 26 mmol/L (ref 22–32)
Calcium: 8.8 mg/dL — ABNORMAL LOW (ref 8.9–10.3)
Chloride: 104 mmol/L (ref 98–111)
Creatinine, Ser: 0.98 mg/dL (ref 0.61–1.24)
GFR, Estimated: >60 mL/min (ref >60)
Glucose, Bld: 115 mg/dL — ABNORMAL HIGH (ref 70–99)
Potassium: 3.7 mmol/L (ref 3.5–5.1)
Sodium: 137 mmol/L (ref 135–145)
Total Bilirubin: 0.4 mg/dL (ref 0.3–1.2)
Total Protein: 6.3 g/dL — ABNORMAL LOW (ref 6.5–8.1)

## 2020-08-13 LAB — PREPARE RBC (CROSSMATCH)

## 2020-08-13 LAB — HEMOGLOBIN AND HEMATOCRIT, BLOOD
HCT: 22.3 % — ABNORMAL LOW (ref 39.0–52.0)
HCT: 23.9 % — ABNORMAL LOW (ref 39.0–52.0)
HCT: 25 % — ABNORMAL LOW (ref 39.0–52.0)
HCT: 25.8 % — ABNORMAL LOW (ref 39.0–52.0)
Hemoglobin: 6.9 g/dL — CL (ref 13.0–17.0)
Hemoglobin: 7.4 g/dL — ABNORMAL LOW (ref 13.0–17.0)
Hemoglobin: 7.7 g/dL — ABNORMAL LOW (ref 13.0–17.0)
Hemoglobin: 7.9 g/dL — ABNORMAL LOW (ref 13.0–17.0)

## 2020-08-13 LAB — SURGICAL PCR SCREEN
MRSA, PCR: NEGATIVE
Staphylococcus aureus: POSITIVE — AB

## 2020-08-13 SURGERY — TURBT (TRANSURETHRAL RESECTION OF BLADDER TUMOR)
Anesthesia: General

## 2020-08-13 MED ORDER — CHLORHEXIDINE GLUCONATE 0.12 % MT SOLN
15.0000 mL | Freq: Once | OROMUCOSAL | Status: AC
  Administered 2020-08-13: 15 mL via OROMUCOSAL

## 2020-08-13 MED ORDER — CEFAZOLIN SODIUM-DEXTROSE 2-3 GM-%(50ML) IV SOLR
INTRAVENOUS | Status: DC | PRN
  Administered 2020-08-13: 2 g via INTRAVENOUS

## 2020-08-13 MED ORDER — SODIUM CHLORIDE 0.9 % IV SOLN: INTRAVENOUS | Status: DC

## 2020-08-13 MED ORDER — LIDOCAINE 2% (20 MG/ML) 5 ML SYRINGE
INTRAMUSCULAR | Status: DC | PRN
  Administered 2020-08-13: 60 mg via INTRAVENOUS
  Administered 2020-08-13: 40 mg via INTRAVENOUS

## 2020-08-13 MED ORDER — LIDOCAINE HCL URETHRAL/MUCOSAL 2 % EX GEL
CUTANEOUS | Status: AC
  Filled 2020-08-13: qty 30

## 2020-08-13 MED ORDER — CHLORTHALIDONE 25 MG PO TABS
25.0000 mg | ORAL_TABLET | Freq: Every day | ORAL | Status: DC
  Administered 2020-08-13 – 2020-08-14 (×2): 25 mg via ORAL
  Filled 2020-08-13 (×2): qty 1

## 2020-08-13 MED ORDER — LACTATED RINGERS IV SOLN: INTRAVENOUS | Status: DC

## 2020-08-13 MED ORDER — LOSARTAN POTASSIUM 50 MG PO TABS
50.0000 mg | ORAL_TABLET | Freq: Every day | ORAL | Status: DC
  Administered 2020-08-13 – 2020-08-14 (×2): 50 mg via ORAL
  Filled 2020-08-13 (×3): qty 1

## 2020-08-13 MED ORDER — FENTANYL CITRATE (PF) 100 MCG/2ML IJ SOLN: 25.0000 ug | INTRAMUSCULAR | Status: DC | PRN

## 2020-08-13 MED ORDER — ONDANSETRON HCL 4 MG/2ML IJ SOLN
INTRAMUSCULAR | Status: DC | PRN
  Administered 2020-08-13: 4 mg via INTRAVENOUS

## 2020-08-13 MED ORDER — OXYCODONE HCL 5 MG PO TABS
5.0000 mg | ORAL_TABLET | Freq: Once | ORAL | Status: DC | PRN
Start: 2020-08-13 — End: 2020-08-13

## 2020-08-13 MED ORDER — MUPIROCIN 2 % EX OINT
1.0000 "application " | TOPICAL_OINTMENT | Freq: Two times a day (BID) | CUTANEOUS | Status: DC
  Administered 2020-08-13 – 2020-08-14 (×3): 1 via NASAL
  Filled 2020-08-13: qty 22

## 2020-08-13 MED ORDER — TAMSULOSIN HCL 0.4 MG PO CAPS
0.4000 mg | ORAL_CAPSULE | Freq: Every day | ORAL | Status: DC
  Administered 2020-08-13 – 2020-08-14 (×2): 0.4 mg via ORAL
  Filled 2020-08-13 (×2): qty 1

## 2020-08-13 MED ORDER — CHLORHEXIDINE GLUCONATE CLOTH 2 % EX PADS
6.0000 | MEDICATED_PAD | Freq: Every day | CUTANEOUS | Status: DC
  Administered 2020-08-13: 6 via TOPICAL

## 2020-08-13 MED ORDER — SODIUM CHLORIDE 0.9% IV SOLUTION: Freq: Once | INTRAVENOUS | Status: DC

## 2020-08-13 MED ORDER — LOSARTAN POTASSIUM 50 MG PO TABS: 50.0000 mg | ORAL_TABLET | Freq: Every day | ORAL | Status: DC

## 2020-08-13 MED ORDER — PHENYLEPHRINE 40 MCG/ML (10ML) SYRINGE FOR IV PUSH (FOR BLOOD PRESSURE SUPPORT)
PREFILLED_SYRINGE | INTRAVENOUS | Status: DC | PRN
  Administered 2020-08-13: 120 ug via INTRAVENOUS
  Administered 2020-08-13: 40 ug via INTRAVENOUS
  Administered 2020-08-13 (×2): 120 ug via INTRAVENOUS

## 2020-08-13 MED ORDER — SODIUM CHLORIDE 0.9 % IR SOLN
Status: DC | PRN
  Administered 2020-08-13: 3000 mL
  Administered 2020-08-13: 6000 mL

## 2020-08-13 MED ORDER — SODIUM CHLORIDE 0.9 % IR SOLN
3000.0000 mL | Status: DC
  Administered 2020-08-13 (×3): 3000 mL

## 2020-08-13 MED ORDER — PROPOFOL 10 MG/ML IV BOLUS
INTRAVENOUS | Status: DC | PRN
  Administered 2020-08-13: 200 mg via INTRAVENOUS

## 2020-08-13 MED ORDER — STERILE WATER FOR IRRIGATION IR SOLN
Status: DC | PRN
  Administered 2020-08-13: 500 mL

## 2020-08-13 MED ORDER — CEFAZOLIN SODIUM-DEXTROSE 2-4 GM/100ML-% IV SOLN
INTRAVENOUS | Status: AC
  Filled 2020-08-13: qty 100

## 2020-08-13 MED ORDER — ONDANSETRON HCL 4 MG/2ML IJ SOLN: 4.0000 mg | Freq: Four times a day (QID) | INTRAMUSCULAR | Status: DC | PRN

## 2020-08-13 MED ORDER — FINASTERIDE 5 MG PO TABS
5.0000 mg | ORAL_TABLET | Freq: Every day | ORAL | Status: DC
  Administered 2020-08-13 – 2020-08-14 (×2): 5 mg via ORAL
  Filled 2020-08-13 (×3): qty 1

## 2020-08-13 MED ORDER — OXYCODONE HCL 5 MG/5ML PO SOLN: 5.0000 mg | Freq: Once | ORAL | Status: DC | PRN

## 2020-08-13 MED ORDER — DEXAMETHASONE SODIUM PHOSPHATE 10 MG/ML IJ SOLN
INTRAMUSCULAR | Status: DC | PRN
  Administered 2020-08-13: 4 mg via INTRAVENOUS

## 2020-08-13 MED ORDER — BELLADONNA ALKALOIDS-OPIUM 16.2-30 MG RE SUPP
RECTAL | Status: AC
  Filled 2020-08-13: qty 1

## 2020-08-13 MED ORDER — EPHEDRINE SULFATE-NACL 50-0.9 MG/10ML-% IV SOSY
PREFILLED_SYRINGE | INTRAVENOUS | Status: DC | PRN
  Administered 2020-08-13 (×2): 10 mg via INTRAVENOUS

## 2020-08-13 SURGICAL SUPPLY — 20 items
BAG URO CATCHER STRL LF (MISCELLANEOUS) ×2 IMPLANT
CATH FOLEY 3WAY 30CC 24FR (CATHETERS) ×2
CATH URO 16X24FR 3W FL PS (CATHETERS) IMPLANT
DRAPE FOOT SWITCH (DRAPES) ×2 IMPLANT
ELECT REM PT RETURN 15FT ADLT (MISCELLANEOUS) ×2 IMPLANT
GLOVE SURG ENC TEXT LTX SZ7.5 (GLOVE) ×2 IMPLANT
GOWN STRL REUS W/TWL XL LVL3 (GOWN DISPOSABLE) ×2 IMPLANT
KIT TURNOVER KIT A (KITS) ×2 IMPLANT
LOOP CUT BIPOLAR 24F LRG (ELECTROSURGICAL) ×1 IMPLANT
MANIFOLD NEPTUNE II (INSTRUMENTS) ×2 IMPLANT
NDL SAFETY ECLIPSE 18X1.5 (NEEDLE) ×1 IMPLANT
NEEDLE HYPO 18GX1.5 SHARP (NEEDLE)
PACK CYSTO (CUSTOM PROCEDURE TRAY) ×2 IMPLANT
PENCIL SMOKE EVACUATOR (MISCELLANEOUS) IMPLANT
PLUG CATH AND CAP STER (CATHETERS) ×1 IMPLANT
SYR 30ML LL (SYRINGE) ×1 IMPLANT
SYR TOOMEY IRRIG 70ML (MISCELLANEOUS)
SYRINGE TOOMEY IRRIG 70ML (MISCELLANEOUS) IMPLANT
TUBING CONNECTING 10 (TUBING) ×2 IMPLANT
TUBING UROLOGY SET (TUBING) ×2 IMPLANT

## 2020-08-13 NOTE — Progress Notes (Signed)
Urology Progress Note      Subjective: NAEON.  Hgb only partially responded to transfusion Hematuria continues, catheter is draining but is dark red and clotted in collection bag Remains NPO No new complaints  Objective: Vital signs in last 24 hours: Temp:  [98.2 F (36.8 C)-98.7 F (37.1 C)] 98.3 F (36.8 C) (05/26 0540) Pulse Rate:  [60-93] 68 (05/26 0540) Resp:  [10-18] 18 (05/26 0540) BP: (118-149)/(47-73) 127/60 (05/26 0540) SpO2:  [100 %] 100 % (05/26 0540) Weight:  [99.8 kg] 99.8 kg (05/25 1246)  Intake/Output from previous day: 05/25 0701 - 05/26 0700 In: 315 [Blood:315] Out: 450 [Urine:450] Intake/Output this shift: No intake/output data recorded.  Physical Exam:  General: Alert and oriented CV: RRR Lungs: Clear Abdomen: Soft, appropriately tender. Incisions c/d/i. JP SS GU: Foley in place draining dark red urine  Ext: NT, No erythema  Lab Results: Recent Labs    08/12/20 1347 08/13/20 0143 08/13/20 0526  HGB 6.7* 6.9* 7.4*  HCT 22.8* 22.3* 23.9*   BMET Recent Labs    08/12/20 1347 08/13/20 0526  NA 136 137  K 3.7 3.7  CL 104 104  CO2 23 26  GLUCOSE 123* 115*  BUN 19 18  CREATININE 1.16 0.98  CALCIUM 9.0 8.8*     Studies/Results: CT ABDOMEN PELVIS WO CONTRAST  Result Date: 08/12/2020 CLINICAL DATA:  Hematuria. Lower abdominal pain for 2 weeks. 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 EXAM: CT ABDOMEN AND PELVIS WITHOUT CONTRAST TECHNIQUE: Multidetector CT imaging of the abdomen and pelvis was performed following the standard protocol without IV contrast. COMPARISON:  CT 06/20/2020 FINDINGS: Lower chest: Clear lung bases without pleural effusion or focal airspace disease. Normal heart size. Hepatobiliary: No evidence of focal liver lesion on this noncontrast exam. Gallbladder is physiologically distended. There is abnormal thickening about  the neck of the gallbladder again spanning at least 2.5 cm, also seen on prior. There is abnormal thickening about Phrygian cap of the gallbladder, series 4, image 45. No pericholecystic fat stranding. Proximal common bile duct appears prominent, but is not well assessed on this noncontrast exam. Distal common bile duct is nondistended. Pancreas: No ductal dilatation or inflammation. Spleen: Normal in size without focal abnormality. Adrenals/Urinary Tract: Normal adrenal glands. No hydronephrosis or renal calculi. No perinephric edema. No evidence of focal renal lesion on this noncontrast exam. Rounded heterogeneous hyperdense material within the bladder lumen spans approximately 7.2 x 6.3 x 9.3 cm. This has diminished in size from prior CT. This appears elevated above a rounded soft tissue density arising from the bladder base, which is may be with the prostate. Bladder wall thickening and trabeculation. No significant perivesicular fat stranding. Stomach/Bowel: Decompressed stomach. Unremarkable small bowel. No obstruction or inflammation. Normal appendix. Moderate colonic stool burden with mild colonic tortuosity. No colonic inflammation. Vascular/Lymphatic: Aortic atherosclerosis. No aneurysm. No enlarged lymph nodes in the abdomen or pelvis. Reproductive: Enlarged prostate gland which is difficult to delineate from soft tissue density extending into the base of the bladder, less well-defined on this noncontrast exam the previous. Other: No free air or ascites. Musculoskeletal: There are no acute or suspicious osseous abnormalities. Multilevel facet hypertrophy in the lumbar spine. IMPRESSION: 1. Rounded heterogeneous hyperdense material within the bladder lumen measuring 7.2 x 6.3 x 9.3 cm, diminished in size from prior CT. Suspect blood clot. This is elevated above a rounded soft tissue density at the bladder base which is  contiguous with the prostate gland. 2. Enlarged prostate gland which is difficult to  delineate from soft tissue density extending into the base of the bladder. It is unclear if this represents a primary bladder or prostatic lesion. There is bladder wall thickening and trabeculation, suggesting chronic bladder outlet obstruction. 3. No hydronephrosis. 4. Abnormal thickening about the neck of the gallbladder again seen spanning at least 2.5 cm. There is abnormal thickening about Phrygian cap of the gallbladder. Given persistence from prior exam, mucosal lesion or neoplasm is considered. Recommend nonemergent contrast enhanced MRI of the gallbladder. Aortic Atherosclerosis (ICD10-I70.0). Electronically Signed   By: Keith Rake M.D.   On: 08/12/2020 15:55   US PELVIS LIMITED (TRANSABDOMINAL ONLY)  Result Date: 08/12/2020 CLINICAL DATA:  Hematuria.  Assess for continued clot burden. EXAM: LIMITED ULTRASOUND OF PELVIS TECHNIQUE: Limited transabdominal ultrasound examination of the pelvis was performed. COMPARISON:  Abdominopelvic CT earlier today. Lateral chest sound 06/30/2020 FINDINGS: Interval placement of Foley catheter within the urinary bladder. Heterogeneous material within the bladder lumen, difficult to delineate intraluminal material from bladder wall as the bladder is decompressed. Bladder base lesion on CT is difficult to accurately visualize, the Foley appears elevated. IMPRESSION: Foley catheter decompresses the urinary bladder. Heterogeneous material within the bladder which is difficult to delineate from adjacent wall thickening. Prostate appears elevated due to enlarged prostate and/or bladder base lesion, seen on CT. Electronically Signed   By: Keith Rake M.D.   On: 08/12/2020 18:20    Assessment/Plan:  70 y.o. male with BPH, gross hematuria and clot retention, acute blood los anemia requiring transfusion s/p foley placement  hand irrigation 5/25  - keep NPO for OR - plan for OR today for cysto, clot evacuation and fulguration - agree with serial H&H, transfusions  as appropriate - will initiate slow drip CBI this am given slightly worsened hematuria - cont catheter to drainage at this time, nurses can irrigate as needed if not draining. If still not draining despite flushes or there is worsening hematuria with large amount of clots, please contact urology    LOS: 1 day   Eddie Mendoza 08/13/2020, 7:10 AM

## 2020-08-13 NOTE — Discharge Instructions (Signed)
CYSTOSCOPY HOME CARE INSTRUCTIONS  Activity: Rest for the remainder of the day.  Do not drive or operate equipment today.  You may resume normal activities in one to two days as instructed by your physician.   Meals: Drink plenty of liquids and eat light foods such as gelatin or soup this evening.  You may return to a normal meal plan tomorrow.  Return to Work: You may return to work in one to two days or as instructed by your physician.  Special Instructions / Symptoms: Call your physician if any of these symptoms occur:   -persistent or heavy bleeding  -bleeding which continues after first few urination  -large blood clots that are difficult to pass  -urine stream diminishes or stops completely  -fever equal to or higher than 101 degrees Farenheit.  -cloudy urine with a strong, foul odor  -severe pain   

## 2020-08-13 NOTE — Transfer of Care (Signed)
Immediate Anesthesia Transfer of Care Note  Patient: Eddie Mendoza  Procedure(s) Performed: CYSTOSCOPY, CLOT EVACUATION, FULGERATION (N/A )  Patient Location: PACU  Anesthesia Type:General  Level of Consciousness: awake  Airway & Oxygen Therapy: Patient Spontanous Breathing and Patient connected to face mask oxygen  Post-op Assessment: Report given to RN and Post -op Vital signs reviewed and stable  Post vital signs: Reviewed and stable  Last Vitals:  Vitals Value Taken Time  BP 126/58 08/13/20 1639  Temp    Pulse 81 08/13/20 1641  Resp 12 08/13/20 1641  SpO2 100 % 08/13/20 1641  Vitals shown include unvalidated device data.  Last Pain:  Vitals:   08/13/20 1408  TempSrc: Oral  PainSc: 0-No pain         Complications: No complications documented.

## 2020-08-13 NOTE — Progress Notes (Signed)
PROGRESS NOTE  Eddie Mendoza JJH:[id number] DOB: 06/06/1950 DOA: 08/12/2020 PCP: [facility]  Brief History   70 year old male PMH status post transurethral resection of bladder tumor, presented with gross hematuria.  A & P  Gross hematuria thought secondary to prostatic in origin. --Continue irrigation, definitive management per urology.  Acute blood loss anemia secondary to gross hematuria. --Hemoglobin improved with 1 unit PRBC.  Stable in the mid 7 range.  Given ongoing bleeding, will transfuse 1 unit additionally.  BPH --Continue home medication  Methamphetamine use --Recommend abstinence.  Abnormal thickening neck gallbladder spanning wrist 2.5 cm. Given persistence from prior exam, mucosal lesion or neoplasm is considered. Recommend nonemergent contrast enhanced MRI of the gallbladder.   Disposition Plan:  Discussion:   Status is: Inpatient  Remains inpatient appropriate because:Inpatient level of care appropriate due to severity of illness  Dispo: The patient is from: Home              Anticipated d/c is to: Home              Patient currently is not medically stable to d/c.   Difficult to place patient No  DVT prophylaxis: SCDs Start: 08/12/20 2302   Code Status: Full Code Level of care: Telemetry Family Communication: wife at bedsie  Murray Hodgkins, MD  [facility] Direct contact: see [email or web address] (further directions at bottom of note if needed) 7PM-7AM contact night coverage as at bottom of note 08/13/2020, 3:59 PM  LOS: 1 day     Consults:  [facility]    Procedures:  Mar[facility] Kitchen   Micro Data:  .    Antimicrobials:  .   Interval History/Subjective  CC: f/u hematuria  Feels okay today, no pain, no difficulty breathing  Objective   Vitals:  Vitals:   08/13/20 0943 08/13/20 1408  BP: (!) 108/52 (!) 125/57  Pulse: 68 73  Resp: 14 16  Temp: 98.6 F (37 C) 98.9 F (37.2 C)  SpO2: 100% 100%    Exam:  Constitutional:    . Appears calm and comfortable ENMT:  . grossly normal hearing  Respiratory:  . CTA bilaterally, no w/r/r.  . Respiratory effort normal.  Cardiovascular:  . RRR, no m/r/g . No LE extremity edema   Psychiatric:  . Mental status o Mood, affect appropriate  I have personally reviewed the following:   Today's Data  . CMP unremarkable . Hemoglobin stable at 7.7  Scheduled Meds: . [MAR Hold] Chlorhexidine Gluconate Cloth  6 each Topical Daily  . [MAR Hold] chlorthalidone  25 mg Oral Daily  . [MAR Hold] finasteride  5 mg Oral Daily  . [MAR Hold] losartan  50 mg Oral Daily  . [MAR Hold] mupirocin ointment  1 application Nasal BID  . [MAR Hold] tamsulosin  0.4 mg Oral Daily   Continuous Infusions: . sodium chloride 50 mL/hr at 08/13/20 1016  . ceFAZolin    . lactated ringers 10 mL/hr at 08/13/20 1412  . sodium chloride irrigation      Principal Problem:   Gross hematuria Active Problems:   Acute blood loss anemia   Abnormal CT scan, gallbladder   LOS: 1 day   How to contact the [facility] Attending or Consulting provider Helen or covering provider during after hours [place], for this patient?  1. Check the care team in [facility] and look for a) attending/consulting [facility] provider listed and b) the [facility] team listed 2. Log into [email or web address] and use [place]'s universal password  to access. If you do not have the password, please contact the hospital operator. 3. Locate the [facility] provider you are looking for under [facility] and page to a number that you can be directly reached. 4. If you still have difficulty reaching the provider, please page the [facility] (Director on Call) for the Hospitalists listed on amion for assistance.

## 2020-08-13 NOTE — Anesthesia Postprocedure Evaluation (Signed)
Anesthesia Post Note  Patient: Eddie Mendoza  Procedure(s) Performed: CYSTOSCOPY, CLOT EVACUATION, FULGERATION (N/A )     Patient location during evaluation: PACU Anesthesia Type: General Level of consciousness: awake and alert Pain management: pain level controlled Vital Signs Assessment: post-procedure vital signs reviewed and stable Respiratory status: spontaneous breathing, nonlabored ventilation, respiratory function stable and patient connected to nasal cannula oxygen Cardiovascular status: blood pressure returned to baseline and stable Postop Assessment: no apparent nausea or vomiting Anesthetic complications: no   No complications documented.  Last Vitals:  Vitals:   08/13/20 1730 08/13/20 1739  BP: 138/60 113/68  Pulse: 73 70  Resp: (!) 25 20  Temp: 36.7 C 36.7 C  SpO2: 100% 100%    Last Pain:  Vitals:   08/13/20 1739  TempSrc:   PainSc: 0-No pain                 Colie Josten S

## 2020-08-13 NOTE — Progress Notes (Signed)
Patient back from procedure. Output from foley yellow and clear no evidence of blood or clots at this time. No pain reported from patient. Most recent Hgb 7.9. MD Sarajane Jews was notified and told this RN to hold off for now on infusing the 1 unit of ordered blood. Patient is resting comfortably in bed awaiting his dinner, his wife is at bedside. No s/s of distress noted. Patient had no c/o at this time. Will continue to monitor output from foley through end of shift.

## 2020-08-13 NOTE — ED Notes (Signed)
ED TO INPATIENT HANDOFF REPORT  Name/Age/Gender Eddie Mendoza 70 y.o. male  Code Status    Code Status Orders  (From admission, onward)         Start     Ordered   08/12/20 2302  Full code  Continuous        08/12/20 2301        Code Status History    Date Active Date Inactive Code Status Order ID Comments User Context   06/28/2020 1425 07/01/2020 1820 Full Code [id number]  Harold Hedge, MD ED   06/20/2020 2219 06/22/2020 1731 Full Code [id number]  Bethena Roys, MD Inpatient   Advance Care Planning Activity      Home/SNF/Other Home  Chief Complaint Gross hematuria [R31.0]  Level of Care/Admitting Diagnosis ED Disposition    ED Disposition Condition [facility]: [facility] [[id number]]  Level of Care: Telemetry [5]  Admit to tele based on following criteria: Monitor for Ischemic changes  May admit patient to [facility] or [facility] if equivalent level of care is available:: No  Covid Evaluation: Recent COVID positive no isolation required infection day 21-90  Diagnosis: Gross hematuria [599.71.ICD-9-CM]  Admitting Physician: Jonnie Finner [[id number]]  Attending Physician: Jonnie Finner [[id number]]  Estimated length of stay: past midnight tomorrow  Certification:: I certify this patient will need inpatient services for at least 2 midnights       Medical History Past Medical History:  Diagnosis Date  . Hypertension     Allergies No Known Allergies  IV Location/Drains/Wounds Patient Lines/Drains/Airways Status    Active Line/Drains/Airways    Name Placement date Placement time Site Days   Peripheral IV 08/12/20 20 G Right 08/12/20  1429  --  1          Labs/Imaging Results for orders placed or performed during the hospital encounter of 08/12/20 (from the past 48 hour(s))  CBC     Status: Abnormal   Collection Time: 08/12/20  1:47 PM  Result Value Ref Range   WBC 10.1 4.0 - 10.5 K/uL   RBC 2.75 (L) 4.22 -  5.81 MIL/uL   Hemoglobin 6.7 (LL) 13.0 - 17.0 g/dL    Comment: REPEATED TO VERIFY THIS CRITICAL RESULT HAS VERIFIED AND BEEN CALLED TO SAVOY,B RN BY MARINDA BLACK ON 05 25 2022 AT 1447, AND HAS BEEN READ BACK. RBV CRITICAL HGB 6.7    HCT 22.8 (L) 39.0 - 52.0 %   MCV 82.9 80.0 - 100.0 fL   MCH 24.4 (L) 26.0 - 34.0 pg   MCHC 29.4 (L) 30.0 - 36.0 g/dL   RDW 16.1 (H) 11.5 - 15.5 %   Platelets 474 (H) 150 - 400 K/uL   nRBC 0.0 0.0 - 0.2 %    Comment: Performed at [facility], [place] [address], [place], [place] [address]  Basic metabolic panel     Status: Abnormal   Collection Time: 08/12/20  1:47 PM  Result Value Ref Range   Sodium 136 135 - 145 mmol/L   Potassium 3.7 3.5 - 5.1 mmol/L   Chloride 104 98 - 111 mmol/L   CO2 23 22 - 32 mmol/L   Glucose, Bld 123 (H) 70 - 99 mg/dL    Comment: Glucose reference range applies only to samples taken after fasting for at least 8 hours.   BUN 19 8 - 23 mg/dL   Creatinine, Ser 1.16 0.61 - 1.24 mg/dL  Calcium 9.0 8.9 - 10.3 mg/dL   GFR, Estimated >60 >60 mL/min    Comment: (NOTE) Calculated using the CKD-EPI Creatinine Equation (2021)    Anion gap 9 5 - 15    Comment: Performed at [facility], [place] [address] [place], [place] [address]  Urinalysis, Routine w reflex microscopic Urine, Clean Catch     Status: Abnormal   Collection Time: 08/12/20  2:06 PM  Result Value Ref Range   Color, Urine RED (A) YELLOW   APPearance TURBID (A) CLEAR   Specific Gravity, Urine  1.005 - 1.030    TEST NOT REPORTED DUE TO COLOR INTERFERENCE OF URINE PIGMENT   pH  5.0 - 8.0    TEST NOT REPORTED DUE TO COLOR INTERFERENCE OF URINE PIGMENT   Glucose, UA (A) NEGATIVE mg/dL    TEST NOT REPORTED DUE TO COLOR INTERFERENCE OF URINE PIGMENT   Hgb urine dipstick (A) NEGATIVE    TEST NOT REPORTED DUE TO COLOR INTERFERENCE OF URINE PIGMENT   Bilirubin Urine (A) NEGATIVE    TEST NOT REPORTED DUE TO COLOR INTERFERENCE OF URINE PIGMENT    Ketones, ur (A) NEGATIVE mg/dL    TEST NOT REPORTED DUE TO COLOR INTERFERENCE OF URINE PIGMENT   Protein, ur (A) NEGATIVE mg/dL    TEST NOT REPORTED DUE TO COLOR INTERFERENCE OF URINE PIGMENT   Nitrite (A) NEGATIVE    TEST NOT REPORTED DUE TO COLOR INTERFERENCE OF URINE PIGMENT   Leukocytes,Ua (A) NEGATIVE    TEST NOT REPORTED DUE TO COLOR INTERFERENCE OF URINE PIGMENT    Comment: Performed at [facility], [place] [address], [place], [place] [address]  Urinalysis, Microscopic (reflex)     Status: Abnormal   Collection Time: 08/12/20  2:06 PM  Result Value Ref Range   RBC / HPF >50 0 - 5 RBC/hpf   WBC, UA 0-5 0 - 5 WBC/hpf   Bacteria, UA NONE SEEN NONE SEEN   Squamous Epithelial / LPF 0-5 0 - 5   Non Squamous Epithelial 0-5 (A) NONE SEEN   WBC Clumps NONE SEEN    Mucus NONE SEEN     Comment: Performed at [facility], [place] [address], [place], [place] [address]  Type and screen     Status: None (Preliminary result)   Collection Time: 08/12/20  3:02 PM  Result Value Ref Range   ABO/RH(D) A POS    Antibody Screen NEG    Sample Expiration 08/15/2020,2359    Unit Number [id number]    Blood Component Type RED CELLS,LR    Unit division 00    Status of Unit ISSUED    Transfusion Status OK TO TRANSFUSE    Crossmatch Result      Compatible Performed at [place] [address] [place], [place] [address]   Prepare RBC (crossmatch)     Status: None   Collection Time: 08/12/20  3:30 PM  Result Value Ref Range   Order Confirmation      ORDER PROCESSED BY BLOOD BANK Performed at [place] [address] [place], [place] [address]   Hemoglobin and hematocrit, blood     Status: Abnormal   Collection Time: 08/13/20  1:43 AM  Result Value Ref Range   Hemoglobin 6.9 (LL) 13.0 - 17.0 g/dL    Comment: CRITICAL VALUE NOTED.  VALUE IS CONSISTENT WITH PREVIOUSLY REPORTED AND CALLED VALUE. REPEATED TO  VERIFY    HCT 22.3 (L) 39.0 -  52.0 %    Comment: Performed at [facility], [place] [address] [place], [place] [address]   CT ABDOMEN PELVIS WO CONTRAST  Result Date: 08/12/2020 CLINICAL DATA:  Hematuria. Lower abdominal pain for 2 weeks. 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 EXAM: CT ABDOMEN AND PELVIS WITHOUT CONTRAST TECHNIQUE: Multidetector CT imaging of the abdomen and pelvis was performed following the standard protocol without IV contrast. COMPARISON:  CT 06/20/2020 FINDINGS: Lower chest: Clear lung bases without pleural effusion or focal airspace disease. Normal heart size. Hepatobiliary: No evidence of focal liver lesion on this noncontrast exam. Gallbladder is physiologically distended. There is abnormal thickening about the neck of the gallbladder again spanning at least 2.5 cm, also seen on prior. There is abnormal thickening about Phrygian cap of the gallbladder, series 4, image 45. No pericholecystic fat stranding. Proximal common bile duct appears prominent, but is not well assessed on this noncontrast exam. Distal common bile duct is nondistended. Pancreas: No ductal dilatation or inflammation. Spleen: Normal in size without focal abnormality. Adrenals/Urinary Tract: Normal adrenal glands. No hydronephrosis or renal calculi. No perinephric edema. No evidence of focal renal lesion on this noncontrast exam. Rounded heterogeneous hyperdense material within the bladder lumen spans approximately 7.2 x 6.3 x 9.3 cm. This has diminished in size from prior CT. This appears elevated above a rounded soft tissue density arising from the bladder base, which is may be with the prostate. Bladder wall thickening and trabeculation. No significant perivesicular fat stranding. Stomach/Bowel: Decompressed stomach. Unremarkable small bowel. No obstruction or inflammation. Normal appendix.  Moderate colonic stool burden with mild colonic tortuosity. No colonic inflammation. Vascular/Lymphatic: Aortic atherosclerosis. No aneurysm. No enlarged lymph nodes in the abdomen or pelvis. Reproductive: Enlarged prostate gland which is difficult to delineate from soft tissue density extending into the base of the bladder, less well-defined on this noncontrast exam the previous. Other: No free air or ascites. Musculoskeletal: There are no acute or suspicious osseous abnormalities. Multilevel facet hypertrophy in the lumbar spine. IMPRESSION: 1. Rounded heterogeneous hyperdense material within the bladder lumen measuring 7.2 x 6.3 x 9.3 cm, diminished in size from prior CT. Suspect blood clot. This is elevated above a rounded soft tissue density at the bladder base which is contiguous with the prostate gland. 2. Enlarged prostate gland which is difficult to delineate from soft tissue density extending into the base of the bladder. It is unclear if this represents a primary bladder or prostatic lesion. There is bladder wall thickening and trabeculation, suggesting chronic bladder outlet obstruction. 3. No hydronephrosis. 4. Abnormal thickening about the neck of the gallbladder again seen spanning at least 2.5 cm. There is abnormal thickening about Phrygian cap of the gallbladder. Given persistence from prior exam, mucosal lesion or neoplasm is considered. Recommend nonemergent contrast enhanced MRI of the gallbladder. Aortic Atherosclerosis (ICD10-I70.0). Electronically Signed   By: Keith Rake M.D.   On: 08/12/2020 15:55   US PELVIS LIMITED (TRANSABDOMINAL ONLY)  Result Date: 08/12/2020 CLINICAL DATA:  Hematuria.  Assess for continued clot burden. EXAM: LIMITED ULTRASOUND OF PELVIS TECHNIQUE: Limited transabdominal ultrasound examination of the pelvis was performed. COMPARISON:  Abdominopelvic CT earlier today. Lateral chest sound 06/30/2020 FINDINGS: Interval placement of Foley catheter within the urinary  bladder. Heterogeneous material within the bladder lumen, difficult to delineate intraluminal material from bladder wall as the bladder is decompressed. Bladder base lesion on CT is difficult to accurately visualize, the  Foley appears elevated. IMPRESSION: Foley catheter decompresses the urinary bladder. Heterogeneous material within the bladder which is difficult to delineate from adjacent wall thickening. Prostate appears elevated due to enlarged prostate and/or bladder base lesion, seen on CT. Electronically Signed   By: Keith Rake M.D.   On: 08/12/2020 18:20    Pending Labs Unresulted Labs (From admission, onward)          Start     Ordered   08/13/20 0500  Comprehensive metabolic panel  Tomorrow morning,   R        08/12/20 2301   08/13/20 0330  Prepare RBC (crossmatch)  ONCE - STAT,   STAT       Question Answer Comment  # of Units 1 unit   Transfusion Indications Actively Bleeding / GI Bleed   Number of Units to Keep Ahead NO units ahead   If emergent release call blood bank Not emergent release      08/13/20 0329   08/12/20 2302  Hemoglobin and hematocrit, blood  Now then every 6 hours,   R (with STAT occurrences)      08/12/20 2301          Vitals/Pain Today's Vitals   08/13/20 0030 08/13/20 0130 08/13/20 0200 08/13/20 0230  BP: (!) 146/72 (!) 142/69 (!) 124/55 (!) 121/47  Pulse: 68 60 64 65  Resp: 13 14 15 14   Temp:      TempSrc:      SpO2: 100% 100% 100% 100%  Weight:      Height:      PainSc:        Isolation Precautions No active isolations  Medications Medications  acetaminophen (TYLENOL) tablet 650 mg (has no administration in time range)    Or  acetaminophen (TYLENOL) suppository 650 mg (has no administration in time range)  oxyCODONE (Oxy IR/ROXICODONE) immediate release tablet 5 mg (has no administration in time range)  0.9 %  sodium chloride infusion (0 mL/hr Intravenous Stopped 08/12/20 2046)    Mobility walks with device

## 2020-08-13 NOTE — ED Notes (Signed)
Per Mansy, MD, repeat H&H after transfusion of additional unit.

## 2020-08-13 NOTE — ED Notes (Signed)
Patient is resting comfortably. VSS. Respirations even and unlabored

## 2020-08-13 NOTE — Anesthesia Preprocedure Evaluation (Signed)
Anesthesia Evaluation  Patient identified by MRN, date of birth, ID band Patient awake    Reviewed: Allergy & Precautions, H&P , NPO status , Patient's Chart, lab work & pertinent test results  Airway Mallampati: II   Neck ROM: full    Dental   Pulmonary Current Smoker,    breath sounds clear to auscultation       Cardiovascular hypertension,  Rhythm:regular Rate:Normal     Neuro/Psych    GI/Hepatic   Endo/Other    Renal/GU      Musculoskeletal   Abdominal   Peds  Hematology  (+) Blood dyscrasia, anemia ,   Anesthesia Other Findings   Reproductive/Obstetrics                             Anesthesia Physical Anesthesia Plan  ASA: II  Anesthesia Plan: General   Post-op Pain Management:    Induction: Intravenous  PONV Risk Score and Plan: 1 and Ondansetron, Dexamethasone and Treatment may vary due to age or medical condition  Airway Management Planned: LMA  Additional Equipment:   Intra-op Plan:   Post-operative Plan: Extubation in OR  Informed Consent: I have reviewed the patients History and Physical, chart, labs and discussed the procedure including the risks, benefits and alternatives for the proposed anesthesia with the patient or authorized representative who has indicated his/her understanding and acceptance.     Dental advisory given  Plan Discussed with: CRNA, Anesthesiologist and Surgeon  Anesthesia Plan Comments:         Anesthesia Quick Evaluation

## 2020-08-13 NOTE — Progress Notes (Signed)
Urology Inpatient Progress Report  Blood loss anemia [D50.0] Gross hematuria [R31.0] Abnormal CT scan, gallbladder [R93.2] Hematuria [R31.9]     Intv/Subj: The patient was admitted overnight and a three-way catheter inserted.  He was transfused 1 unit of packed red blood cells. His Foley catheter was irrigated copious clots removed.  This morning the patient continues to have gross hematuria with clots.  Denies any pain.  Active Problems:   Gross hematuria  Current Facility-Administered Medications  Medication Dose Route Frequency Provider Last Rate Last Admin  . acetaminophen (TYLENOL) tablet 650 mg  650 mg Oral Q6H PRN Marylyn Ishihara, Tyrone A, DO       Or  . acetaminophen (TYLENOL) suppository 650 mg  650 mg Rectal Q6H PRN Marylyn Ishihara, Tyrone A, DO      . Chlorhexidine Gluconate Cloth 2 % PADS 6 each  6 each Topical Daily Kyle, Tyrone A, DO      . chlorthalidone (HYGROTON) tablet 25 mg  25 mg Oral Daily Kyle, Tyrone A, DO      . finasteride (PROSCAR) tablet 5 mg  5 mg Oral Daily Kyle, Tyrone A, DO      . losartan (COZAAR) tablet 50 mg  50 mg Oral Daily Samuella Cota, MD      . oxyCODONE (Oxy IR/ROXICODONE) immediate release tablet 5 mg  5 mg Oral Q4H PRN Marylyn Ishihara, Tyrone A, DO      . tamsulosin (FLOMAX) capsule 0.4 mg  0.4 mg Oral Daily Kyle, Tyrone A, DO         Objective: Vital: Vitals:   08/13/20 0230 08/13/20 0436 08/13/20 0453 08/13/20 0540  BP: (!) 121/47 118/61 125/63 127/60  Pulse: 65 63 63 68  Resp: 14 14 13 18   Temp:  98.5 F (36.9 C) 98.5 F (36.9 C) 98.3 F (36.8 C)  TempSrc:  Oral Oral   SpO2: 100% 100% 100% 100%  Weight:      Height:       I/Os: I/O last 3 completed shifts: In: 39 [Blood:615] Out: 450 [Urine:450]  Physical Exam:  General: Patient is in no apparent distress Lungs: Normal respiratory effort, chest expands symmetrically. GI: Abdomen is soft Foley: Very dark urine with lots of clots in the tubing. Ext: lower extremities symmetric  Lab  Results: Recent Labs    08/12/20 1347 08/13/20 0143 08/13/20 0526  WBC 10.1  --   --   HGB 6.7* 6.9* 7.4*  HCT 22.8* 22.3* 23.9*   Recent Labs    08/12/20 1347 08/13/20 0526  NA 136 137  K 3.7 3.7  CL 104 104  CO2 23 26  GLUCOSE 123* 115*  BUN 19 18  CREATININE 1.16 0.98  CALCIUM 9.0 8.8*   No results for input(s): LABPT, INR in the last 72 hours. No results for input(s): LABURIN in the last 72 hours. Results for orders placed or performed during the hospital encounter of 08/12/20  Resp Panel by RT-PCR (Flu A&B, Covid) Nasopharyngeal Swab     Status: None   Collection Time: 08/13/20  4:31 AM   Specimen: Nasopharyngeal Swab; Nasopharyngeal(NP) swabs in vial transport medium  Result Value Ref Range Status   SARS Coronavirus 2 by RT PCR NEGATIVE NEGATIVE Final    Comment: (NOTE) SARS-CoV-2 target nucleic acids are NOT DETECTED.  The SARS-CoV-2 RNA is generally detectable in upper respiratory specimens during the acute phase of infection. The lowest concentration of SARS-CoV-2 viral copies this assay can detect is 138 copies/mL. A negative result does not  preclude SARS-Cov-2 infection and should not be used as the sole basis for treatment or other patient management decisions. A negative result may occur with  improper specimen collection/handling, submission of specimen other than nasopharyngeal swab, presence of viral mutation(s) within the areas targeted by this assay, and inadequate number of viral copies(<138 copies/mL). A negative result must be combined with clinical observations, patient history, and epidemiological information. The expected result is Negative.  Fact Sheet for Patients:  EntrepreneurPulse.com.au  Fact Sheet for Healthcare Providers:  IncredibleEmployment.be  This test is no t yet approved or cleared by the [place] FDA and  has been authorized for detection and/or diagnosis of SARS-CoV-2 by FDA under  an Emergency Use Authorization (EUA). This EUA will remain  in effect (meaning this test can be used) for the duration of the COVID-19 declaration under Section 564(b)(1) of the Act, 21 U.S.C.section 360bbb-3(b)(1), unless the authorization is terminated  or revoked sooner.       Influenza A by PCR NEGATIVE NEGATIVE Final   Influenza B by PCR NEGATIVE NEGATIVE Final    Comment: (NOTE) The Xpert Xpress SARS-CoV-2/FLU/RSV plus assay is intended as an aid in the diagnosis of influenza from Nasopharyngeal swab specimens and should not be used as a sole basis for treatment. Nasal washings and aspirates are unacceptable for Xpert Xpress SARS-CoV-2/FLU/RSV testing.  Fact Sheet for Patients: EntrepreneurPulse.com.au  Fact Sheet for Healthcare Providers: IncredibleEmployment.be  This test is not yet approved or cleared by the [place] FDA and has been authorized for detection and/or diagnosis of SARS-CoV-2 by FDA under an Emergency Use Authorization (EUA). This EUA will remain in effect (meaning this test can be used) for the duration of the COVID-19 declaration under Section 564(b)(1) of the Act, 21 U.S.C. section 360bbb-3(b)(1), unless the authorization is terminated or revoked.  Performed at [facility], [place] [address] [place], [place] [address]     Studies/Results: CT ABDOMEN PELVIS WO CONTRAST  Result Date: 08/12/2020 CLINICAL DATA:  Hematuria. Lower abdominal pain for 2 weeks. 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 EXAM: CT ABDOMEN AND PELVIS WITHOUT CONTRAST TECHNIQUE: Multidetector CT imaging of the abdomen and pelvis was performed following the standard protocol without IV contrast. COMPARISON:  CT 06/20/2020 FINDINGS: Lower chest: Clear lung bases without pleural effusion or focal airspace disease. Normal heart  size. Hepatobiliary: No evidence of focal liver lesion on this noncontrast exam. Gallbladder is physiologically distended. There is abnormal thickening about the neck of the gallbladder again spanning at least 2.5 cm, also seen on prior. There is abnormal thickening about Phrygian cap of the gallbladder, series 4, image 45. No pericholecystic fat stranding. Proximal common bile duct appears prominent, but is not well assessed on this noncontrast exam. Distal common bile duct is nondistended. Pancreas: No ductal dilatation or inflammation. Spleen: Normal in size without focal abnormality. Adrenals/Urinary Tract: Normal adrenal glands. No hydronephrosis or renal calculi. No perinephric edema. No evidence of focal renal lesion on this noncontrast exam. Rounded heterogeneous hyperdense material within the bladder lumen spans approximately 7.2 x 6.3 x 9.3 cm. This has diminished in size from prior CT. This appears elevated above a rounded soft tissue density arising from the bladder base, which is may be with the prostate. Bladder wall thickening and trabeculation. No significant perivesicular fat stranding. Stomach/Bowel: Decompressed stomach. Unremarkable small bowel. No obstruction or inflammation. Normal appendix. Moderate colonic stool burden with mild colonic tortuosity.  No colonic inflammation. Vascular/Lymphatic: Aortic atherosclerosis. No aneurysm. No enlarged lymph nodes in the abdomen or pelvis. Reproductive: Enlarged prostate gland which is difficult to delineate from soft tissue density extending into the base of the bladder, less well-defined on this noncontrast exam the previous. Other: No free air or ascites. Musculoskeletal: There are no acute or suspicious osseous abnormalities. Multilevel facet hypertrophy in the lumbar spine. IMPRESSION: 1. Rounded heterogeneous hyperdense material within the bladder lumen measuring 7.2 x 6.3 x 9.3 cm, diminished in size from prior CT. Suspect blood clot. This is  elevated above a rounded soft tissue density at the bladder base which is contiguous with the prostate gland. 2. Enlarged prostate gland which is difficult to delineate from soft tissue density extending into the base of the bladder. It is unclear if this represents a primary bladder or prostatic lesion. There is bladder wall thickening and trabeculation, suggesting chronic bladder outlet obstruction. 3. No hydronephrosis. 4. Abnormal thickening about the neck of the gallbladder again seen spanning at least 2.5 cm. There is abnormal thickening about Phrygian cap of the gallbladder. Given persistence from prior exam, mucosal lesion or neoplasm is considered. Recommend nonemergent contrast enhanced MRI of the gallbladder. Aortic Atherosclerosis (ICD10-I70.0). Electronically Signed   By: Keith Rake M.D.   On: 08/12/2020 15:55   US PELVIS LIMITED (TRANSABDOMINAL ONLY)  Result Date: 08/12/2020 CLINICAL DATA:  Hematuria.  Assess for continued clot burden. EXAM: LIMITED ULTRASOUND OF PELVIS TECHNIQUE: Limited transabdominal ultrasound examination of the pelvis was performed. COMPARISON:  Abdominopelvic CT earlier today. Lateral chest sound 06/30/2020 FINDINGS: Interval placement of Foley catheter within the urinary bladder. Heterogeneous material within the bladder lumen, difficult to delineate intraluminal material from bladder wall as the bladder is decompressed. Bladder base lesion on CT is difficult to accurately visualize, the Foley appears elevated. IMPRESSION: Foley catheter decompresses the urinary bladder. Heterogeneous material within the bladder which is difficult to delineate from adjacent wall thickening. Prostate appears elevated due to enlarged prostate and/or bladder base lesion, seen on CT. Electronically Signed   By: Keith Rake M.D.   On: 08/12/2020 18:20    Assessment: Gross hematuria likely prostatic in origin. Plan: Plan is to take the patient to the operating room this afternoon  for clot EVAC and fulguration.  We will then hopefully be able to wean him off CBI and remove his catheter tomorrow morning prior to discharge.  I also would like the patient to be seen by interventional radiology if possible today for consideration of prostatic artery embolization in the near future to minimize these recurrent prostatic bleeding circumstances in the future.   Louis Meckel, MD Urology 08/13/2020, 7:43 AM

## 2020-08-13 NOTE — Hospital Course (Signed)
70 year old male PMH status post transurethral resection of bladder tumor, presented with gross hematuria.  A & P  Gross hematuria thought secondary to prostatic in origin. --Continue irrigation, definitive management per urology.  Acute blood loss anemia secondary to gross hematuria. --Hemoglobin improved with 1 unit PRBC.  Stable in the mid 7 range.  Given ongoing bleeding, will transfuse 1 unit additionally.  BPH --Continue home medication  Methamphetamine use --Recommend abstinence.  Abnormal thickening neck gallbladder spanning wrist 2.5 cm. Given persistence from prior exam, mucosal lesion or neoplasm is considered. Recommend nonemergent contrast enhanced MRI of the gallbladder.

## 2020-08-13 NOTE — Progress Notes (Addendum)
IR consulted by Dr. 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  Patient to consult with IR rad at IR clinic- IR schedulers to call patient to set up this appointment (order has been placed for this). No plans for IP IR interventions at this time- will delete IP order. Will make Dr. Louis Meckel aware.  Please call IR with questions/concerns.   Bea Graff Juan Olthoff, PA-C 08/13/2020, 9:38 AM

## 2020-08-13 NOTE — Anesthesia Procedure Notes (Signed)
Procedure Name: LMA Insertion °Performed by: Kaleb Linquist H, CRNA °Pre-anesthesia Checklist: Patient identified, Emergency Drugs available, Suction available and Patient being monitored °Patient Re-evaluated:Patient Re-evaluated prior to induction °Oxygen Delivery Method: Circle System Utilized °Preoxygenation: Pre-oxygenation with 100% oxygen °Induction Type: IV induction °Ventilation: Mask ventilation without difficulty °LMA: LMA inserted °LMA Size: 4.0 °Number of attempts: 1 °Airway Equipment and Method: Bite block °Placement Confirmation: positive ETCO2 °Tube secured with: Tape °Dental Injury: Teeth and Oropharynx as per pre-operative assessment  ° ° ° ° ° ° °

## 2020-08-13 NOTE — Op Note (Signed)
Preoperative diagnosis:  1. Gross hematuria   Postoperative diagnosis:  1. same   Procedure: 1. Cystoscopy, clot evacuation 2. Prostatic bleeding fulgaration  Surgeon: Ardis Hughs, MD  Anesthesia: General  Complications: None  Intraoperative findings:  Large prostate with significant intravesical lobe Bleeding from prostate - fulgarated Large amount of clot removed from bladder  EBL: Minimal  Specimens: None  Indication: Cebastian R Ensminger is a 70 y.o. 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  After reviewing the management options for treatment, he elected to proceed with the above surgical procedure(s). We have discussed the potential benefits and risks of the procedure, side effects of the proposed treatment, the likelihood of the patient achieving the goals of the procedure, and any potential problems that might occur during the procedure or recuperation. Informed consent has been obtained.  Description of procedure:  The patient was taken to the operating room and general anesthesia was induced.  The patient was placed in the dorsal lithotomy position, prepped and draped in the usual sterile fashion, and preoperative antibiotics were administered. A preoperative time-out was performed.   20F resectoscope sheath passed into the patient's bladder and toomey syringed used for evacuating the large amount of clots. I then insepcted most of his bladder (limited by his massive intravesical prostate) and noted no bleeding or tumors.  I pulled back into his prostate and fulgarated the areas that were bleeding.  I then removed the scope and placed a 15F 3-way foley catheter.  Clear efflux was noted.  Ardis Hughs, M.D.

## 2020-08-14 ENCOUNTER — Encounter (HOSPITAL_COMMUNITY): Payer: Self-pay | Admitting: Urology

## 2020-08-14 DIAGNOSIS — D62 Acute posthemorrhagic anemia: Secondary | ICD-10-CM | POA: Diagnosis not present

## 2020-08-14 DIAGNOSIS — R932 Abnormal findings on diagnostic imaging of liver and biliary tract: Secondary | ICD-10-CM | POA: Diagnosis not present

## 2020-08-14 DIAGNOSIS — R310 Gross hematuria: Secondary | ICD-10-CM | POA: Diagnosis not present

## 2020-08-14 LAB — CBC
HCT: 24.7 % — ABNORMAL LOW (ref 39.0–52.0)
Hemoglobin: 7.5 g/dL — ABNORMAL LOW (ref 13.0–17.0)
MCH: 25.6 pg — ABNORMAL LOW (ref 26.0–34.0)
MCHC: 30.4 g/dL (ref 30.0–36.0)
MCV: 84.3 fL (ref 80.0–100.0)
Platelets: 382 10*3/uL (ref 150–400)
RBC: 2.93 MIL/uL — ABNORMAL LOW (ref 4.22–5.81)
RDW: 16 % — ABNORMAL HIGH (ref 11.5–15.5)
WBC: 13.6 10*3/uL — ABNORMAL HIGH (ref 4.0–10.5)
nRBC: 0 % (ref 0.0–0.2)

## 2020-08-14 NOTE — Progress Notes (Signed)
Urology Inpatient Progress Report  Blood loss anemia [D50.0] Gross hematuria [R31.0] Abnormal CT scan, gallbladder [R93.2] Hematuria [R31.9]  Procedure(s): CYSTOSCOPY, CLOT EVACUATION, FULGERATION  1 Day Post-Op   Intv/Subj: No acute events overnight. Patient is without complaint. Urine clear off CBI - hemoglobin stable  Principal Problem:   Gross hematuria Active Problems:   Acute blood loss anemia   Abnormal CT scan, gallbladder  Current Facility-Administered Medications  Medication Dose Route Frequency Provider Last Rate Last Admin  . 0.9 %  sodium chloride infusion (Manually program via Guardrails IV Fluids)   Intravenous Once Samuella Cota, MD      . 0.9 %  sodium chloride infusion   Intravenous Continuous Samuella Cota, MD 50 mL/hr at 08/13/20 1016 New Bag at 08/13/20 1016  . acetaminophen (TYLENOL) tablet 650 mg  650 mg Oral Q6H PRN Ardis Hughs, MD       Or  . acetaminophen (TYLENOL) suppository 650 mg  650 mg Rectal Q6H PRN Ardis Hughs, MD      . Chlorhexidine Gluconate Cloth 2 % PADS 6 each  6 each Topical Daily Ardis Hughs, MD   6 each at 08/13/20 [phone]  . chlorthalidone (HYGROTON) tablet 25 mg  25 mg Oral Daily Ardis Hughs, MD   25 mg at 08/13/20 0905  . finasteride (PROSCAR) tablet 5 mg  5 mg Oral Daily Ardis Hughs, MD   5 mg at 08/13/20 0905  . losartan (COZAAR) tablet 50 mg  50 mg Oral Daily Ardis Hughs, MD   50 mg at 08/13/20 7209  . mupirocin ointment (BACTROBAN) 2 % 1 application  1 application Nasal BID Ardis Hughs, MD   1 application at 47/09/62 2102  . oxyCODONE (Oxy IR/ROXICODONE) immediate release tablet 5 mg  5 mg Oral Q4H PRN Ardis Hughs, MD      . sodium chloride irrigation 0.9 % 3,000 mL  3,000 mL Irrigation Continuous Ardis Hughs, MD   3,000 mL at 08/13/20 1535  . tamsulosin (FLOMAX) capsule 0.4 mg  0.4 mg Oral Daily Ardis Hughs, MD   0.4 mg at 08/13/20 0905      Objective: Vital: Vitals:   08/13/20 1730 08/13/20 1739 08/13/20 2040 08/14/20 0505  BP: 138/60 113/68 117/64 127/65  Pulse: 73 70 78 63  Resp: (!) 25 20 18 16   Temp: 98 F (36.7 C) 98.1 F (36.7 C) 98.7 F (37.1 C) 98 F (36.7 C)  TempSrc:  Oral Oral Oral  SpO2: 100% 100% 100% 100%  Weight:      Height:       I/Os: I/O last 3 completed shifts: In: 3245.4 [I.V.:780.4; Blood:615; Other:1850] Out: 6250 [Urine:6250]  Physical Exam:  General: Patient is in no apparent distress Lungs: Normal respiratory effort, chest expands symmetrically. GI:  The abdomen is soft and nontender without mass. Foley: clear yellow urine  Ext: lower extremities symmetric  Lab Results: Recent Labs    08/12/20 1347 08/13/20 0143 08/13/20 0526 08/13/20 1121 08/13/20 1657  WBC 10.1  --   --   --   --   HGB 6.7*   < > 7.4* 7.7* 7.9*  HCT 22.8*   < > 23.9* 25.0* 25.8*   < > = values in this interval not displayed.   Recent Labs    08/12/20 1347 08/13/20 0526  NA 136 137  K 3.7 3.7  CL 104 104  CO2 23 26  GLUCOSE 123* 115*  BUN  19 18  CREATININE 1.16 0.98  CALCIUM 9.0 8.8*   No results for input(s): LABPT, INR in the last 72 hours. No results for input(s): LABURIN in the last 72 hours. Results for orders placed or performed during the hospital encounter of 08/12/20  Resp Panel by RT-PCR (Flu A&B, Covid) Nasopharyngeal Swab     Status: None   Collection Time: 08/13/20  4:31 AM   Specimen: Nasopharyngeal Swab; Nasopharyngeal(NP) swabs in vial transport medium  Result Value Ref Range Status   SARS Coronavirus 2 by RT PCR NEGATIVE NEGATIVE Final    Comment: (NOTE) SARS-CoV-2 target nucleic acids are NOT DETECTED.  The SARS-CoV-2 RNA is generally detectable in upper respiratory specimens during the acute phase of infection. The lowest concentration of SARS-CoV-2 viral copies this assay can detect is 138 copies/mL. A negative result does not preclude SARS-Cov-2 infection and  should not be used as the sole basis for treatment or other patient management decisions. A negative result may occur with  improper specimen collection/handling, submission of specimen other than nasopharyngeal swab, presence of viral mutation(s) within the areas targeted by this assay, and inadequate number of viral copies(<138 copies/mL). A negative result must be combined with clinical observations, patient history, and epidemiological information. The expected result is Negative.  Fact Sheet for Patients:  EntrepreneurPulse.com.au  Fact Sheet for Healthcare Providers:  IncredibleEmployment.be  This test is no t yet approved or cleared by the [place] FDA and  has been authorized for detection and/or diagnosis of SARS-CoV-2 by FDA under an Emergency Use Authorization (EUA). This EUA will remain  in effect (meaning this test can be used) for the duration of the COVID-19 declaration under Section 564(b)(1) of the Act, 21 U.S.C.section 360bbb-3(b)(1), unless the authorization is terminated  or revoked sooner.       Influenza A by PCR NEGATIVE NEGATIVE Final   Influenza B by PCR NEGATIVE NEGATIVE Final    Comment: (NOTE) The Xpert Xpress SARS-CoV-2/FLU/RSV plus assay is intended as an aid in the diagnosis of influenza from Nasopharyngeal swab specimens and should not be used as a sole basis for treatment. Nasal washings and aspirates are unacceptable for Xpert Xpress SARS-CoV-2/FLU/RSV testing.  Fact Sheet for Patients: EntrepreneurPulse.com.au  Fact Sheet for Healthcare Providers: IncredibleEmployment.be  This test is not yet approved or cleared by the [place] FDA and has been authorized for detection and/or diagnosis of SARS-CoV-2 by FDA under an Emergency Use Authorization (EUA). This EUA will remain in effect (meaning this test can be used) for the duration of the COVID-19 declaration  under Section 564(b)(1) of the Act, 21 U.S.C. section 360bbb-3(b)(1), unless the authorization is terminated or revoked.  Performed at [facility], [place] [address], [place], [place] [address]   Surgical PCR screen     Status: Abnormal   Collection Time: 08/13/20  9:11 AM   Specimen: Nasal Mucosa; Nasal Swab  Result Value Ref Range Status   MRSA, PCR NEGATIVE NEGATIVE Final   Staphylococcus aureus POSITIVE (A) NEGATIVE Final    Comment: (NOTE) The Xpert SA Assay (FDA approved for NASAL specimens in patients 109 years of age and older), is one component of a comprehensive surveillance program. It is not intended to diagnose infection nor to guide or monitor treatment. Performed at [facility], [place] [address], [place], [place] [address]     Studies/Results: CT ABDOMEN PELVIS WO CONTRAST  Result Date: 08/12/2020 CLINICAL DATA:  Hematuria. Lower abdominal pain for 2 weeks. 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 EXAM: CT ABDOMEN AND PELVIS WITHOUT CONTRAST TECHNIQUE: Multidetector CT imaging of the abdomen and pelvis was performed following the standard protocol without IV contrast. COMPARISON:  CT 06/20/2020 FINDINGS: Lower chest: Clear lung bases without pleural effusion or focal airspace disease. Normal heart size. Hepatobiliary: No evidence of focal liver lesion on this noncontrast exam. Gallbladder is physiologically distended. There is abnormal thickening about the neck of the gallbladder again spanning at least 2.5 cm, also seen on prior. There is abnormal thickening about Phrygian cap of the gallbladder, series 4, image 45. No pericholecystic fat stranding. Proximal common bile duct appears prominent, but is not well assessed on this noncontrast exam. Distal common bile duct is nondistended. Pancreas: No ductal dilatation or inflammation. Spleen:  Normal in size without focal abnormality. Adrenals/Urinary Tract: Normal adrenal glands. No hydronephrosis or renal calculi. No perinephric edema. No evidence of focal renal lesion on this noncontrast exam. Rounded heterogeneous hyperdense material within the bladder lumen spans approximately 7.2 x 6.3 x 9.3 cm. This has diminished in size from prior CT. This appears elevated above a rounded soft tissue density arising from the bladder base, which is may be with the prostate. Bladder wall thickening and trabeculation. No significant perivesicular fat stranding. Stomach/Bowel: Decompressed stomach. Unremarkable small bowel. No obstruction or inflammation. Normal appendix. Moderate colonic stool burden with mild colonic tortuosity. No colonic inflammation. Vascular/Lymphatic: Aortic atherosclerosis. No aneurysm. No enlarged lymph nodes in the abdomen or pelvis. Reproductive: Enlarged prostate gland which is difficult to delineate from soft tissue density extending into the base of the bladder, less well-defined on this noncontrast exam the previous. Other: No free air or ascites. Musculoskeletal: There are no acute or suspicious osseous abnormalities. Multilevel facet hypertrophy in the lumbar spine. IMPRESSION: 1. Rounded heterogeneous hyperdense material within the bladder lumen measuring 7.2 x 6.3 x 9.3 cm, diminished in size from prior CT. Suspect blood clot. This is elevated above a rounded soft tissue density at the bladder base which is contiguous with the prostate gland. 2. Enlarged prostate gland which is difficult to delineate from soft tissue density extending into the base of the bladder. It is unclear if this represents a primary bladder or prostatic lesion. There is bladder wall thickening and trabeculation, suggesting chronic bladder outlet obstruction. 3. No hydronephrosis. 4. Abnormal thickening about the neck of the gallbladder again seen spanning at least 2.5 cm. There is abnormal thickening about  Phrygian cap of the gallbladder. Given persistence from prior exam, mucosal lesion or neoplasm is considered. Recommend nonemergent contrast enhanced MRI of the gallbladder. Aortic Atherosclerosis (ICD10-I70.0). Electronically Signed   By: Keith Rake M.D.   On: 08/12/2020 15:55   US PELVIS LIMITED (TRANSABDOMINAL ONLY)  Result Date: 08/12/2020 CLINICAL DATA:  Hematuria.  Assess for continued clot burden. EXAM: LIMITED ULTRASOUND OF PELVIS TECHNIQUE: Limited transabdominal ultrasound examination of the pelvis was performed. COMPARISON:  Abdominopelvic CT earlier today. Lateral chest sound 06/30/2020 FINDINGS: Interval placement of Foley catheter within the urinary bladder. Heterogeneous material within the bladder lumen, difficult to delineate intraluminal material from bladder wall as the bladder is decompressed. Bladder base lesion on CT is difficult to accurately visualize, the Foley appears elevated. IMPRESSION: Foley catheter decompresses the urinary bladder. Heterogeneous material within the bladder which is difficult to delineate from adjacent wall thickening. Prostate appears elevated due to enlarged prostate and/or bladder base lesion, seen on CT. Electronically Signed  By: Keith Rake M.D.   On: 08/12/2020 18:20    Assessment: Procedure(s): CYSTOSCOPY, CLOT EVACUATION, FULGERATION, 1 Day Post-Op  doing well.  Plan: D/c foley, home per hospitalist.   F/u with IR as outpatient to discuss prostatic artery embolization Will contact patient for clinic f/u w/ me in the next few weeks.    Louis Meckel, MD Urology 08/14/2020, 5:09 AM

## 2020-08-14 NOTE — Discharge Summary (Signed)
Physician Discharge Summary  Eddie Mendoza RXV:[id number] DOB: [birth year] DOA: 08/12/2020  PCP: [facility]  Admit date: 08/12/2020 Discharge date: 08/14/2020  Recommendations for Outpatient Follow-up:   Gross hematuria thought secondary to prostatic in origin. -- Follow-up with neurology, ambulatory referral to Interventional radiology  Abnormal thickening neck gallbladder spanning at least 2.5 cm. Given persistence from prior exam, mucosal lesion or neoplasm is considered. Recommend nonemergent contrast enhanced MRI of the gallbladder.  This was discussed with the patient.    Follow-up Information    Ardis Hughs, MD In 2 weeks.   Specialty: Urology Contact information: [place] [address] [phone]        Cl[facility], [facility] Schedule an appointment as soon as possible for a visit in 2 week(s).   Contact information: [place] [address] [phone]                Discharge Diagnoses: Principal diagnosis is #1 Principal Problem:   Gross hematuria Active Problems:   Acute blood loss anemia   Abnormal CT scan, gallbladder   Discharge Condition: improved Disposition: home  Diet recommendation:  Diet Orders (From admission, onward)    Start     Ordered   08/14/20 0000  Diet - low sodium heart healthy        08/14/20 1001   08/13/20 1739  Diet regular Room service appropriate? Yes; Fluid consistency: Thin  Diet effective now       Question Answer Comment  Room service appropriate? 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  He was placed on continuous irrigation and seen in consultation with urology.  He subsequently underwent cystoscopy, clot  evacuation and prostatic bleeding fulguration.  Bleeding stopped.  Hemoglobin stable.  Voiding without bleeding today 5/27.  Cleared for discharge by urology.  He was referred to interventional radiology as an outpatient for consideration of prostatic artery embolization in the near future.  Gross hematuria thought secondary to prostatic in origin. -- Resolved status post management per urology as above.  Follow-up with neurology as an outpatient.  Outpatient referral to interventional radiology for consideration of embolization as above.  Acute blood loss anemia secondary to gross hematuria. --Hemoglobin improved with 2 units PRBC.  Stable in the mid 7 range.  Discussed value today with patient, recommended transfusing 1 additional unit, patient declined.  I feel this is reasonable given stable hemoglobin, no bleeding and no symptoms.  He knows to return for any bleeding.  BPH --Continue home medication  Methamphetamine use --Recommend abstinence.  Abnormal thickening neck gallbladder spanning at least 2.5 cm. Given persistence from prior exam, mucosal lesion or neoplasm is considered. Recommend nonemergent contrast enhanced MRI of the gallbladder.  I discussed this with the patient.  Consults:   Urology   Procedures:  1. Cystoscopy, clot evacuation 2. Prostatic bleeding fulgaration  Today's assessment: S: CC: f/u hematuria  Feels really good today, up since 5 AM.  Ambulating without difficulty.  No dizziness.  No bleeding, has voided several times this morning.  O: Vitals:  Vitals:   08/13/20 2040 08/14/20 0505  BP: 117/64 127/65  Pulse: 78 63  Resp: 18 16  Temp: 98.7 F (37.1 C) 98 F (36.7 C)  SpO2: 100% 100%    Constitutional:  . Appears calm and comfortable ENMT:  . grossly normal hearing  Respiratory:  . CTA bilaterally, no w/r/r.  . Respiratory effort normal. Cardiovascular:  . RRR, no m/r/g . No LE extremity edema   Psychiatric:  . Mental status o Mood,  affect appropriate  Hgb stable at 7.5  Discharge Instructions  Discharge Instructions    Diet - low sodium heart healthy   Complete by: As directed    Discharge instructions   Complete by: As directed    Call your physician or seek immediate medical attention for bleeding, difficulty peeing, pain, vomiting, confusion, weakness, dizziness, falls, lightheadedness or worsening of condition.   Increase activity slowly   Complete by: As directed    No wound care   Complete by: As directed      Allergies as of 08/14/2020   No Known Allergies     Medication List    TAKE these medications   chlorthalidone 25 MG tablet Commonly known as: HYGROTON Take 1 tablet by mouth daily.   finasteride 5 MG tablet Commonly known as: PROSCAR Take 1 tablet (5 mg total) by mouth daily.   lidocaine 5 % ointment Commonly known as: XYLOCAINE Apply 1 application topically 4 (four) times daily as needed for mild pain or moderate pain.   losartan 100 MG tablet Commonly known as: COZAAR Take 50 mg by mouth daily.   polyethylene glycol 17 g packet Commonly known as: MIRALAX / GLYCOLAX Take 17 g by mouth daily as needed for mild constipation.   tamsulosin 0.4 MG Caps capsule Commonly known as: Flomax Take 1 capsule (0.4 mg total) by mouth daily.      No Known Allergies  The results of significant diagnostics from this hospitalization (including imaging, microbiology, ancillary and laboratory) are listed below for reference.    Significant Diagnostic Studies: CT ABDOMEN PELVIS WO CONTRAST  Result Date: 08/12/2020 CLINICAL DATA:  Hematuria. Lower abdominal pain for 2 weeks. 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 EXAM: CT ABDOMEN AND PELVIS WITHOUT CONTRAST TECHNIQUE: Multidetector CT imaging of the abdomen and pelvis was performed following the standard protocol without IV contrast.  COMPARISON:  CT 06/20/2020 FINDINGS: Lower chest: Clear lung bases without pleural effusion or focal airspace disease. Normal heart size. Hepatobiliary: No evidence of focal liver lesion on this noncontrast exam. Gallbladder is physiologically distended. There is abnormal thickening about the neck of the gallbladder again spanning at least 2.5 cm, also seen on prior. There is abnormal thickening about Phrygian cap of the gallbladder, series 4, image 45. No pericholecystic fat stranding. Proximal common bile duct appears prominent, but is not well assessed on this noncontrast exam. Distal common bile duct is nondistended. Pancreas: No ductal dilatation or inflammation. Spleen: Normal in size without focal abnormality. Adrenals/Urinary Tract: Normal adrenal glands. No hydronephrosis or renal calculi. No perinephric edema. No evidence of focal renal lesion on this noncontrast exam. Rounded heterogeneous hyperdense material within the bladder lumen spans approximately 7.2 x 6.3 x 9.3 cm. This has diminished in size from prior CT. This appears elevated above a rounded soft tissue density arising from the bladder base, which is may be with the prostate. Bladder wall thickening and trabeculation. No significant perivesicular fat stranding. Stomach/Bowel: Decompressed stomach. Unremarkable small bowel. No obstruction or inflammation. Normal appendix. Moderate colonic stool burden with mild  colonic tortuosity. No colonic inflammation. Vascular/Lymphatic: Aortic atherosclerosis. No aneurysm. No enlarged lymph nodes in the abdomen or pelvis. Reproductive: Enlarged prostate gland which is difficult to delineate from soft tissue density extending into the base of the bladder, less well-defined on this noncontrast exam the previous. Other: No free air or ascites. Musculoskeletal: There are no acute or suspicious osseous abnormalities. Multilevel facet hypertrophy in the lumbar spine. IMPRESSION: 1. Rounded heterogeneous  hyperdense material within the bladder lumen measuring 7.2 x 6.3 x 9.3 cm, diminished in size from prior CT. Suspect blood clot. This is elevated above a rounded soft tissue density at the bladder base which is contiguous with the prostate gland. 2. Enlarged prostate gland which is difficult to delineate from soft tissue density extending into the base of the bladder. It is unclear if this represents a primary bladder or prostatic lesion. There is bladder wall thickening and trabeculation, suggesting chronic bladder outlet obstruction. 3. No hydronephrosis. 4. Abnormal thickening about the neck of the gallbladder again seen spanning at least 2.5 cm. There is abnormal thickening about Phrygian cap of the gallbladder. Given persistence from prior exam, mucosal lesion or neoplasm is considered. Recommend nonemergent contrast enhanced MRI of the gallbladder. Aortic Atherosclerosis (ICD10-I70.0). Electronically Signed   By: Keith Rake M.D.   On: 08/12/2020 15:55   US PELVIS LIMITED (TRANSABDOMINAL ONLY)  Result Date: 08/12/2020 CLINICAL DATA:  Hematuria.  Assess for continued clot burden. EXAM: LIMITED ULTRASOUND OF PELVIS TECHNIQUE: Limited transabdominal ultrasound examination of the pelvis was performed. COMPARISON:  Abdominopelvic CT earlier today. Lateral chest sound 06/30/2020 FINDINGS: Interval placement of Foley catheter within the urinary bladder. Heterogeneous material within the bladder lumen, difficult to delineate intraluminal material from bladder wall as the bladder is decompressed. Bladder base lesion on CT is difficult to accurately visualize, the Foley appears elevated. IMPRESSION: Foley catheter decompresses the urinary bladder. Heterogeneous material within the bladder which is difficult to delineate from adjacent wall thickening. Prostate appears elevated due to enlarged prostate and/or bladder base lesion, seen on CT. Electronically Signed   By: Keith Rake M.D.   On: 08/12/2020 18:20     Microbiology: Recent Results (from the past 240 hour(s))  Resp Panel by RT-PCR (Flu A&B, Covid) Nasopharyngeal Swab     Status: None   Collection Time: 08/13/20  4:31 AM   Specimen: Nasopharyngeal Swab; Nasopharyngeal(NP) swabs in vial transport medium  Result Value Ref Range Status   SARS Coronavirus 2 by RT PCR NEGATIVE NEGATIVE Final    Comment: (NOTE) SARS-CoV-2 target nucleic acids are NOT DETECTED.  The SARS-CoV-2 RNA is generally detectable in upper respiratory specimens during the acute phase of infection. The lowest concentration of SARS-CoV-2 viral copies this assay can detect is 138 copies/mL. A negative result does not preclude SARS-Cov-2 infection and should not be used as the sole basis for treatment or other patient management decisions. A negative result may occur with  improper specimen collection/handling, submission of specimen other than nasopharyngeal swab, presence of viral mutation(s) within the areas targeted by this assay, and inadequate number of viral copies(<138 copies/mL). A negative result must be combined with clinical observations, patient history, and epidemiological information. The expected result is Negative.  Fact Sheet for Patients:  EntrepreneurPulse.com.au  Fact Sheet for Healthcare Providers:  IncredibleEmployment.be  This test is no t yet approved or cleared by the [place] FDA and  has been authorized for detection and/or diagnosis of SARS-CoV-2 by FDA under an Emergency Use Authorization (EUA). This EUA will  remain  in effect (meaning this test can be used) for the duration of the COVID-19 declaration under Section 564(b)(1) of the Act, 21 U.S.C.section 360bbb-3(b)(1), unless the authorization is terminated  or revoked sooner.       Influenza A by PCR NEGATIVE NEGATIVE Final   Influenza B by PCR NEGATIVE NEGATIVE Final    Comment: (NOTE) The Xpert Xpress SARS-CoV-2/FLU/RSV plus assay is  intended as an aid in the diagnosis of influenza from Nasopharyngeal swab specimens and should not be used as a sole basis for treatment. Nasal washings and aspirates are unacceptable for Xpert Xpress SARS-CoV-2/FLU/RSV testing.  Fact Sheet for Patients: EntrepreneurPulse.com.au  Fact Sheet for Healthcare Providers: IncredibleEmployment.be  This test is not yet approved or cleared by the [place] FDA and has been authorized for detection and/or diagnosis of SARS-CoV-2 by FDA under an Emergency Use Authorization (EUA). This EUA will remain in effect (meaning this test can be used) for the duration of the COVID-19 declaration under Section 564(b)(1) of the Act, 21 U.S.C. section 360bbb-3(b)(1), unless the authorization is terminated or revoked.  Performed at [facility], [place] [address], [place], [place] [address]   Surgical PCR screen     Status: Abnormal   Collection Time: 08/13/20  9:11 AM   Specimen: Nasal Mucosa; Nasal Swab  Result Value Ref Range Status   MRSA, PCR NEGATIVE NEGATIVE Final   Staphylococcus aureus POSITIVE (A) NEGATIVE Final    Comment: (NOTE) The Xpert SA Assay (FDA approved for NASAL specimens in patients 74 years of age and older), is one component of a comprehensive surveillance program. It is not intended to diagnose infection nor to guide or monitor treatment. Performed at [facility], [place] [address] [place],  [address]      Labs: Basic Metabolic Panel: Recent Labs  Lab 08/12/20 1347 08/13/20 0526  NA 136 137  K 3.7 3.7  CL 104 104  CO2 23 26  GLUCOSE 123* 115*  BUN 19 18  CREATININE 1.16 0.98  CALCIUM 9.0 8.8*   Liver Function Tests: Recent Labs  Lab 08/13/20 0526  AST 10*  ALT 8  ALKPHOS 50  BILITOT 0.4  PROT 6.3*  ALBUMIN 3.3*   CBC: Recent Labs  Lab 08/12/20 1347 08/13/20 0143 08/13/20 0526 08/13/20 1121 08/13/20 1657  08/14/20 0546  WBC 10.1  --   --   --   --  13.6*  HGB 6.7* 6.9* 7.4* 7.7* 7.9* 7.5*  HCT 22.8* 22.3* 23.9* 25.0* 25.8* 24.7*  MCV 82.9  --   --   --   --  84.3  PLT 474*  --   --   --   --  382    Principal Problem:   Gross hematuria Active Problems:   Acute blood loss anemia   Abnormal CT scan, gallbladder   Time coordinating discharge: 25 minutes  Signed:  Murray Hodgkins, MD  [facility]  08/14/2020, 10:12 AM

## 2020-08-14 NOTE — Plan of Care (Signed)

## 2020-08-16 ENCOUNTER — Other Ambulatory Visit: Payer: Self-pay

## 2020-08-16 ENCOUNTER — Encounter (HOSPITAL_COMMUNITY): Payer: Self-pay

## 2020-08-16 ENCOUNTER — Emergency Department (HOSPITAL_COMMUNITY)
Admission: EM | Admit: 2020-08-16 | Discharge: 2020-08-16 | Disposition: A | Discharge status: Home / Self Care | Payer: No Typology Code available for payment source | Attending: Emergency Medicine | Admitting: Emergency Medicine

## 2020-08-16 DIAGNOSIS — R310 Gross hematuria: Secondary | ICD-10-CM | POA: Insufficient documentation

## 2020-08-16 DIAGNOSIS — Z79899 Other long term (current) drug therapy: Secondary | ICD-10-CM | POA: Insufficient documentation

## 2020-08-16 DIAGNOSIS — F17210 Nicotine dependence, cigarettes, uncomplicated: Secondary | ICD-10-CM | POA: Diagnosis not present

## 2020-08-16 DIAGNOSIS — Z8616 Personal history of COVID-19: Secondary | ICD-10-CM | POA: Insufficient documentation

## 2020-08-16 DIAGNOSIS — R319 Hematuria, unspecified: Secondary | ICD-10-CM | POA: Diagnosis present

## 2020-08-16 DIAGNOSIS — I10 Essential (primary) hypertension: Secondary | ICD-10-CM | POA: Insufficient documentation

## 2020-08-16 LAB — CBC WITH DIFFERENTIAL/PLATELET
Abs Immature Granulocytes: 0.05 10*3/uL (ref 0.00–0.07)
Basophils Absolute: 0.1 10*3/uL (ref 0.0–0.1)
Basophils Relative: 1 %
Eosinophils Absolute: 0.3 10*3/uL (ref 0.0–0.5)
Eosinophils Relative: 3 %
HCT: 23.9 % — ABNORMAL LOW (ref 39.0–52.0)
Hemoglobin: 7.2 g/dL — ABNORMAL LOW (ref 13.0–17.0)
Immature Granulocytes: 1 %
Lymphocytes Relative: 17 %
Lymphs Abs: 1.5 10*3/uL (ref 0.7–4.0)
MCH: 25.8 pg — ABNORMAL LOW (ref 26.0–34.0)
MCHC: 30.1 g/dL (ref 30.0–36.0)
MCV: 85.7 fL (ref 80.0–100.0)
Monocytes Absolute: 0.7 10*3/uL (ref 0.1–1.0)
Monocytes Relative: 7 %
Neutro Abs: 6.6 10*3/uL (ref 1.7–7.7)
Neutrophils Relative %: 71 %
Platelets: 489 10*3/uL — ABNORMAL HIGH (ref 150–400)
RBC: 2.79 MIL/uL — ABNORMAL LOW (ref 4.22–5.81)
RDW: 16.5 % — ABNORMAL HIGH (ref 11.5–15.5)
WBC: 9.2 10*3/uL (ref 4.0–10.5)
nRBC: 0 % (ref 0.0–0.2)

## 2020-08-16 LAB — TYPE AND SCREEN
ABO/RH(D): A POS
ABO/RH(D): A POS
Antibody Screen: NEGATIVE
Antibody Screen: NEGATIVE
Unit division: 0
Unit division: 0
Unit division: 0

## 2020-08-16 LAB — BASIC METABOLIC PANEL
Anion gap: 5 (ref 5–15)
BUN: 25 mg/dL — ABNORMAL HIGH (ref 8–23)
CO2: 26 mmol/L (ref 22–32)
Calcium: 8.4 mg/dL — ABNORMAL LOW (ref 8.9–10.3)
Chloride: 103 mmol/L (ref 98–111)
Creatinine, Ser: 1.16 mg/dL (ref 0.61–1.24)
GFR, Estimated: >60 mL/min (ref >60)
Glucose, Bld: 137 mg/dL — ABNORMAL HIGH (ref 70–99)
Potassium: 3.6 mmol/L (ref 3.5–5.1)
Sodium: 134 mmol/L — ABNORMAL LOW (ref 135–145)

## 2020-08-16 LAB — BPAM RBC
Blood Product Expiration Date: 202206242359
Blood Product Expiration Date: 202206242359
Blood Product Expiration Date: 202206242359
ISSUE DATE / TIME: 202205251806
ISSUE DATE / TIME: 202205260429
Unit Type and Rh: 6200
Unit Type and Rh: 6200
Unit Type and Rh: 6200

## 2020-08-16 LAB — URINALYSIS, ROUTINE W REFLEX MICROSCOPIC
Specific Gravity, Urine: 1.01 (ref 1.005–1.030)
pH: 7 (ref 5.0–8.0)

## 2020-08-16 LAB — URINALYSIS, MICROSCOPIC (REFLEX)
Bacteria, UA: NONE SEEN
RBC / HPF: >50 RBC/hpf (ref 0–5)
Squamous Epithelial / HPF: NONE SEEN (ref 0–5)

## 2020-08-16 NOTE — ED Notes (Signed)
Pt is refusing a bladder scan. States he has a procedure Monday to fix the issue, and he wants to go home and spend the rest of the day with his wife

## 2020-08-16 NOTE — ED Provider Notes (Signed)
[facility] DEPT Provider Note   CSN: [id number] Arrival date & time: 08/16/20  1742     History Chief Complaint  Patient presents with  . Hematuria    Eddie Mendoza is a 70 y.o. male with PMHx transurethral resection of bladder tumor in April and recent admission for gross hematuria requiring cont bladder irrigation with anemia requiring transfusion.  Patient was discharged on 08/14/2020.  He was able to void prior to discharge and was sent home without Foley.  States yesterday the bleeding began once again.  He has gross blood in his urine every time he urinates.  Today he passed some large clots that with mostly small clots.  He does not feel as though he is retaining urine.  He is not feeling lightheadedness or fatigue like he did when he was admitted requiring transfusion though he is most concerned about his hemoglobin.  He states he is managed by both [facility] for this.  Not on anticoagulation.  No abdominal pain.  Review of medical record, patient's hemoglobin on 08/12/2020 was 6.7.  At discharge 2 days ago it was 7.5.  It appears he has chronic anemia as well.    The history is provided by the patient and medical records.       Past Medical History:  Diagnosis Date  . Abnormal CT scan, gallbladder 08/13/2020  . Hypertension     Patient Active Problem List   Diagnosis Date Noted  . Abnormal CT scan, gallbladder 08/13/2020  . Gross hematuria 08/12/2020  .   06/28/2020  . Acute blood loss anemia 06/28/2020  . COVID-19 virus infection 06/28/2020  . Hematuria 06/20/2020  . HTN (hypertension) 06/20/2020    Past Surgical History:  Procedure Laterality Date  . TRANSURETHRAL RESECTION OF BLADDER TUMOR N/A 06/30/2020   Procedure:  Almyra Free OF BLEEDING, CLOT EVACUATION;  Surgeon: Ardis Hughs, MD;  Location: [facility];  Service: Urology;  Laterality: N/A;  . TRANSURETHRAL RESECTION OF BLADDER TUMOR N/A 08/13/2020    Procedure: CYSTOSCOPY, CLOT EVACUATION, FULGERATION;  Surgeon: Ardis Hughs, MD;  Location: [facility];  Service: Urology;  Laterality: N/A;       History reviewed. No pertinent family history.  Social History   Tobacco Use  . Smoking status: Current Every Day Smoker    Packs/day: 0.50    Years: 30.00    Pack years: 15.00    Types: Cigarettes  . Smokeless tobacco: Never Used  Vaping Use  . Vaping Use: Never used  Substance Use Topics  . Alcohol use: Yes    Alcohol/week: 3.0 standard drinks    Types: 3 Shots of liquor per week  . Drug use: No    Home Medications Prior to Admission medications   Medication Sig Start Date End Date Taking? Authorizing Provider  chlorthalidone (HYGROTON) 25 MG tablet Take 1 tablet by mouth daily. 07/29/20   [provider]  finasteride (PROSCAR) 5 MG tablet Take 1 tablet (5 mg total) by mouth daily. 07/02/20   Patrecia Pour, MD  lidocaine (XYLOCAINE) 5 % ointment Apply 1 application topically 4 (four) times daily as needed for mild pain or moderate pain. 04/15/20   [provider]  losartan (COZAAR) 100 MG tablet Take 50 mg by mouth daily. 07/29/20   [provider]  polyethylene glycol (MIRALAX / GLYCOLAX) 17 g packet Take 17 g by mouth daily as needed for mild constipation. 06/22/20   Pokhrel, Corrie Mckusick, MD  tamsulosin [facility])  0.4 MG CAPS capsule Take 1 capsule (0.4 mg total) by mouth daily. 07/01/20   Patrecia Pour, MD    Allergies    Patient has no known allergies.  Review of Systems   Review of Systems  All other systems reviewed and are negative.   Physical Exam Updated Vital Signs BP 134/69   Pulse 74   Temp 98.5 F (36.9 C) (Oral)   Resp 18   Ht 5\' 10"  (1.778 m)   Wt 99.8 kg   SpO2 100%   BMI 31.57 kg/m   Physical Exam Vitals and nursing note reviewed.  Constitutional:      Appearance: He is well-developed.  HENT:     Head: Normocephalic and atraumatic.  Eyes:     Conjunctiva/sclera: Conjunctivae  normal.  Cardiovascular:     Rate and Rhythm: Normal rate.  Pulmonary:     Effort: Pulmonary effort is normal. No respiratory distress.     Breath sounds: Normal breath sounds.  Abdominal:     General: Bowel sounds are normal.     Palpations: Abdomen is soft.     Tenderness: There is no abdominal tenderness.  Skin:    General: Skin is warm.  Neurological:     Mental Status: He is alert.  Psychiatric:        Behavior: Behavior normal.     ED Results / Procedures / Treatments   Labs (all labs ordered are listed, but only abnormal results are displayed) Labs Reviewed  BASIC METABOLIC PANEL - Abnormal; Notable for the following components:      Result Value   Sodium 134 (*)    Glucose, Bld 137 (*)    BUN 25 (*)    Calcium 8.4 (*)    All other components within normal limits  CBC WITH DIFFERENTIAL/PLATELET - Abnormal; Notable for the following components:   RBC 2.79 (*)    Hemoglobin 7.2 (*)    HCT 23.9 (*)    MCH 25.8 (*)    RDW 16.5 (*)    Platelets 489 (*)    All other components within normal limits  URINALYSIS, ROUTINE W REFLEX MICROSCOPIC  TYPE AND SCREEN    EKG None  Radiology No results found.  Procedures Procedures   Medications Ordered in ED Medications - No data to display  ED Course  I have reviewed the triage vital signs and the nursing notes.  Pertinent labs & imaging results that were available during my care of the patient were reviewed by me and considered in my medical decision making (see chart for details).    MDM Rules/Calculators/A&P                          Patient with presentation as above.  His hemoglobin here is overall stable at 7.2.  He is voiding without difficulty.  Patient states the only thing he wanted was a hemoglobin check and he is ready to leave.  He states he has follow-up on Tuesday with urology.  He refuses UA and bladder scan.  Discussed with patient that it is important that he returns if he is unable to void, if  bleeding worsens, or if he becomes feeling faint, fatigue or short of breath with concern for worsening anemia.  Final Clinical Impression(s) / ED Diagnoses Final diagnoses:  Gross hematuria    Rx / DC Orders ED Discharge Orders    None       Houston Zapien, [place] N, PA-C  08/16/20 2055    Arnaldo Natal, MD 08/16/20 2120

## 2020-08-16 NOTE — ED Triage Notes (Signed)
Patient states he has been having blood in his urine and states he was seen 2 days ago for the same, but is now having blood clots. Patient states his Hgb was 7.4 previously and is worried that it has dropped.

## 2020-08-16 NOTE — Discharge Instructions (Signed)
Follow closely with your urologist. If you are unable to empty your bladder, if you begin feeling faint or weak, return to the Emergency Department.

## 2021-06-26 IMAGING — CT CT ABD-PELV W/ CM
2 of 5 series · 15 of 46 positions shown, 17 images · IV contrast (Omnipaque or Isovue)
Comparison: None available

CLINICAL DATA: Groin pain, bladder hemorrhage.

EXAM:
CT ABDOMEN AND PELVIS WITH CONTRAST
TECHNIQUE: Multidetector CT imaging of the abdomen and pelvis was performed
using the standard protocol following bolus administration of
intravenous contrast.
CONTRAST:  100mL OMNIPAQUE IOHEXOL 300 MG/ML  SOLN

[Series 2: axial st · axial · 0.74mm/px · z∈[+765,+1185]mm · 12 of 94 slices shown, 14 images]
[im 5/94  soft-tissue]
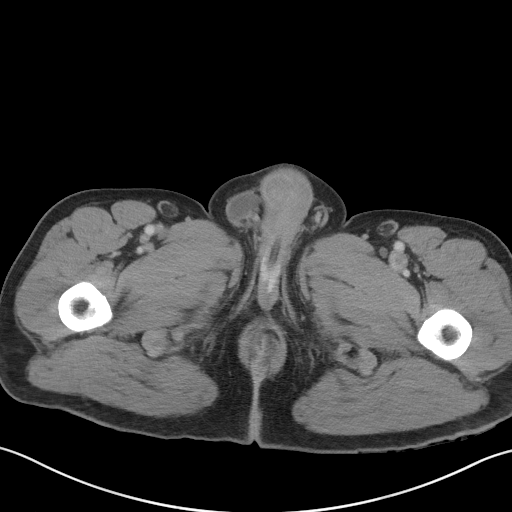
[im 5/94  bone]
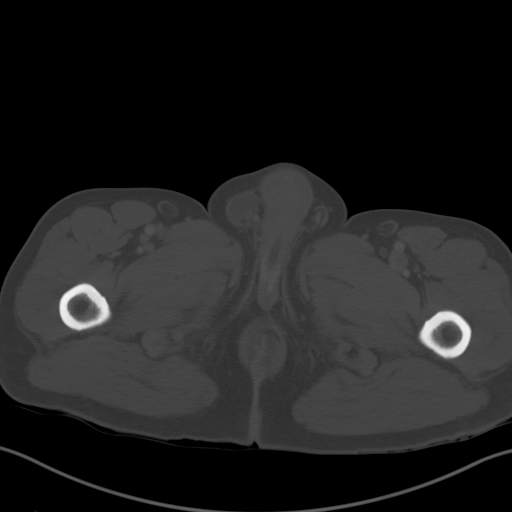
[im 15/94  soft-tissue]
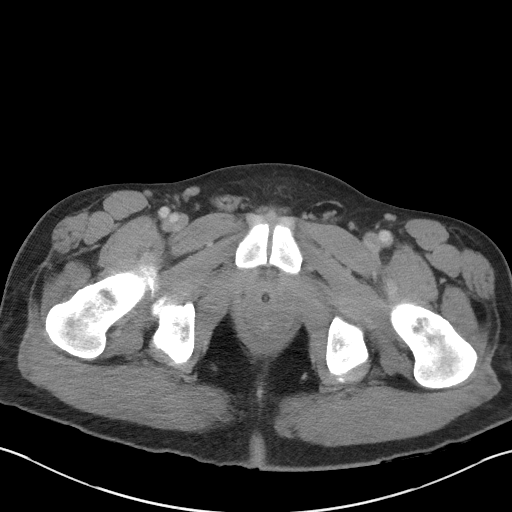
[im 20/94  soft-tissue]
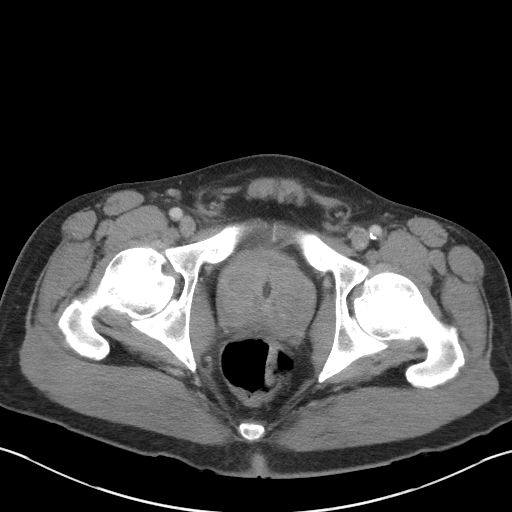
[im 30/94  soft-tissue]
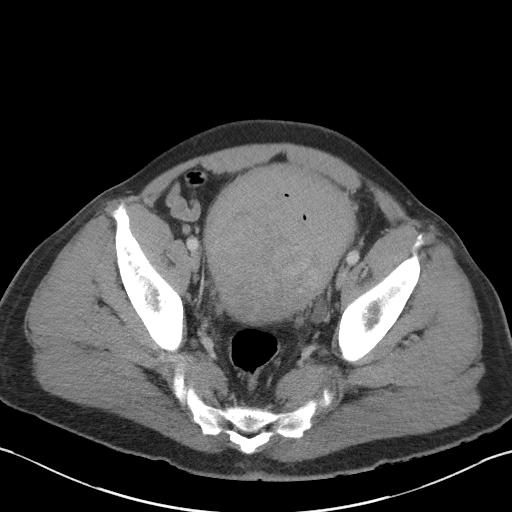
[im 35/94  soft-tissue]
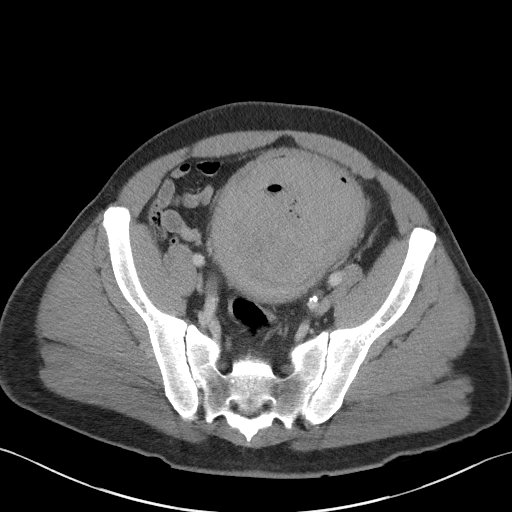
[im 45/94  soft-tissue]
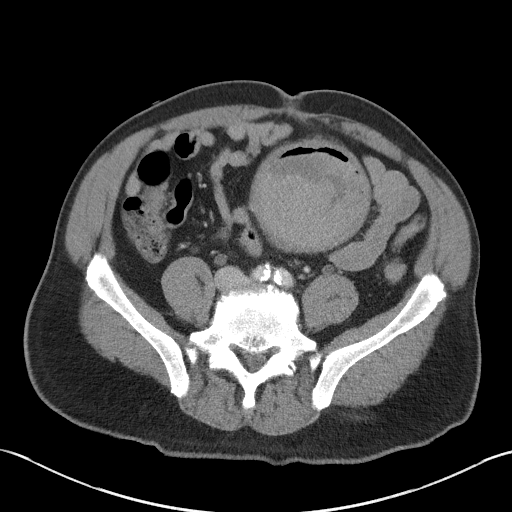
[im 49/94  soft-tissue]
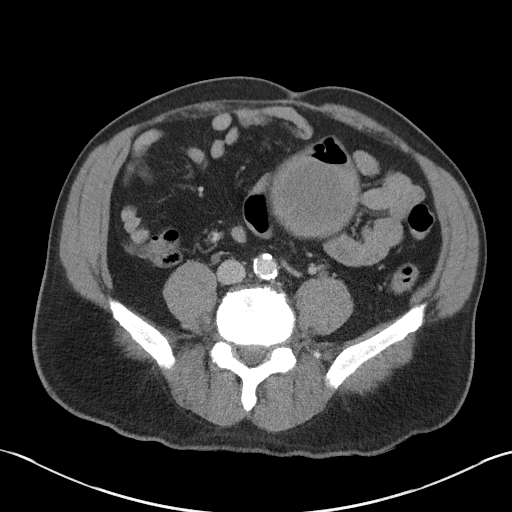
[im 59/94  soft-tissue]
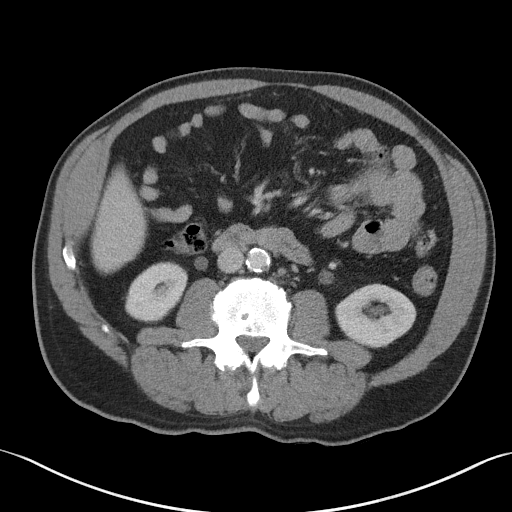
[im 64/94  soft-tissue]
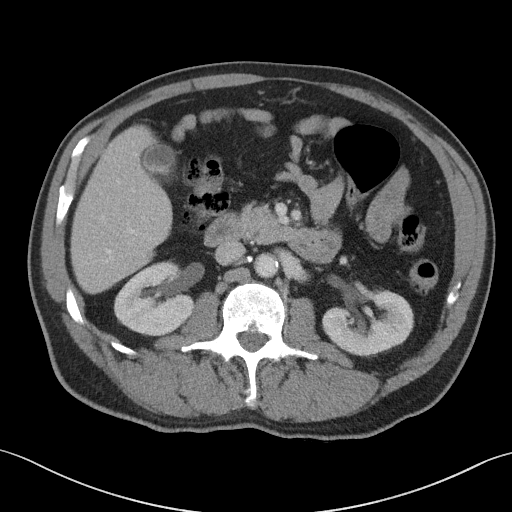
[im 64/94  bone]
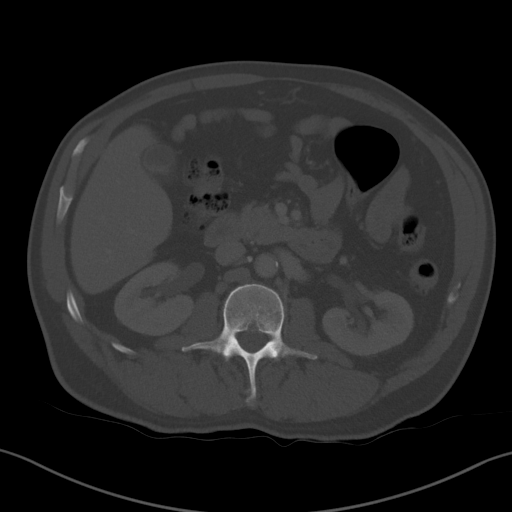
[im 74/94  soft-tissue]
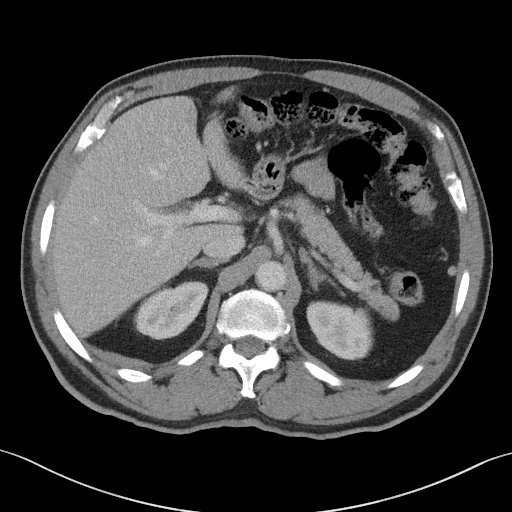
[im 79/94  soft-tissue]
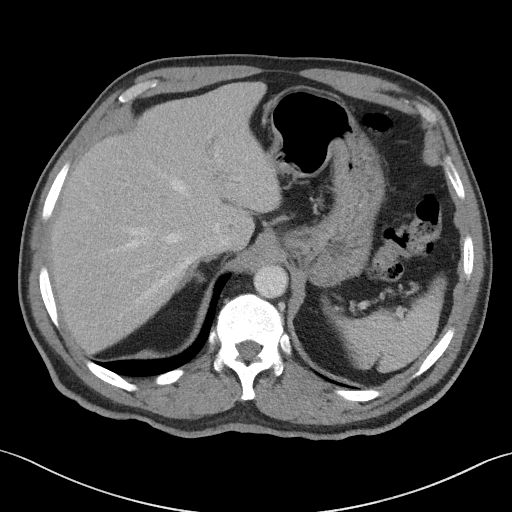
[im 89/94  soft-tissue]
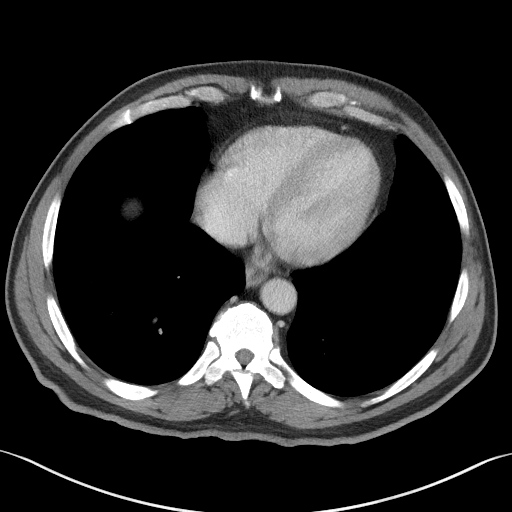

[Series 5: coronal st · coronal · 0.70mm/px · 3 of 100 slices shown]
[im 34/100  soft-tissue]
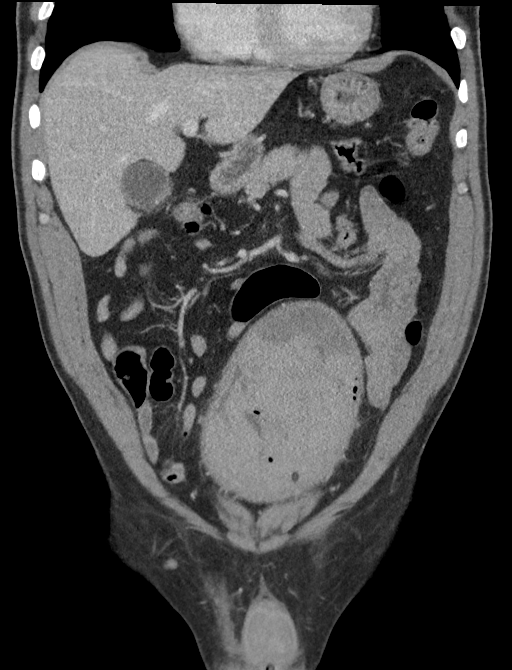
[im 45/100  soft-tissue]
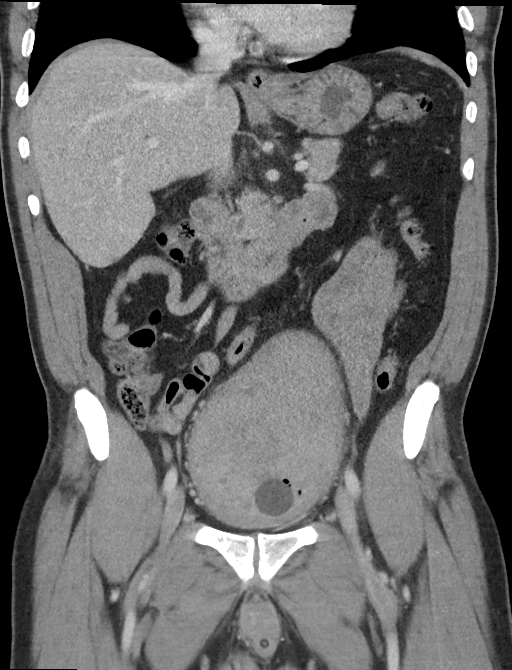
[im 56/100  soft-tissue]
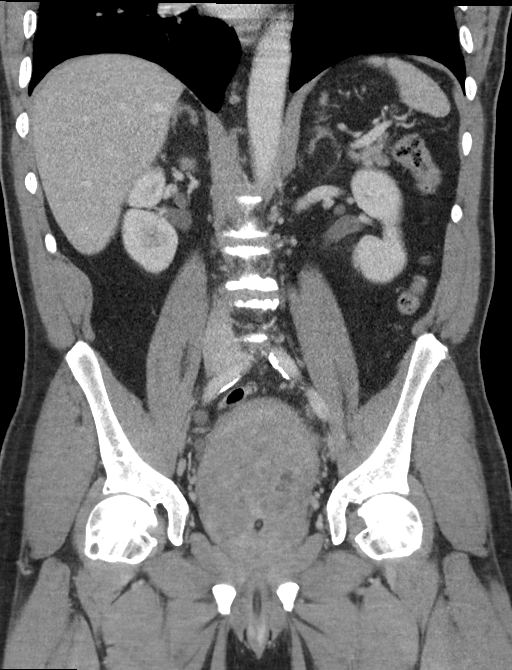

[15 of 46 positions shown; findings below may reference images not displayed]

FINDINGS: Lower chest: Lung bases are clear.

Hepatobiliary: No focal hepatic lesion. There is thickening of the
gallbladder wall through the neck of the gallbladder over a 3 cm
segment (image 28/series 2) potential non radiodense 9 calculus
within the lumen of the gallbladder. No evidence acute
cholecystitis. Common bile duct normal caliber

Pancreas: Pancreas is normal. No ductal dilatation. No pancreatic
inflammation.

Spleen: Normal spleen

Adrenals/urinary tract: Adrenal glands normal. No enhancing renal
cortical lesion. Mild bilateral hydroureter. No obstructing lesion
identified.

There is a Foley catheter within the lumen of the bladder. The Foley
catheter is position above a markedly enlarged prostate gland.

There is a large volume of high-density fluid within the lumen of
the bladder consistent with hematoma/hemorrhage. The bladder is
distended measuring 14.8 x 9.4 by 11.4 cm (volume = 830 cm^3). The
entirety of the lumen of the distended bladder is near completely
filled with blood product. Small amount a gas along the dome of the
bladder. Several small diverticula at this level.

Stomach/Bowel: Stomach, small bowel, appendix, and cecum are normal.
The colon and rectosigmoid colon are normal.

Vascular/Lymphatic: Abdominal aorta is normal caliber with
atherosclerotic calcification. There is no retroperitoneal or
periportal lymphadenopathy. No pelvic lymphadenopathy.

Reproductive: Enlarged prostate gland measures 7.5 by 8.0 by 5.8 cm
(volume = 180 cm^3)

Other: No free fluid.

Musculoskeletal: Degenerative osteophytosis of the spine.
IMPRESSION: 1. The lumen of the distended bladder is near completely filled with
blood product. Bladder distended to a volume of 830 cubic cm. Foley
catheter within lumen of the bladder position above and enlarged
prostate gland.
2. Mural thickening through the neck of the gallbladder. Potential
non radiodense stones within the fundus of the gallbladder. Mural
thickening may represent benign collapse the bladder; however,
cannot exclude a mucosal lesion or neoplasm. Recommend contrast MRI
of the gallbladder on a nonemergent basis.

## 2021-08-19 IMAGING — US US PELVIS LIMITED
1 series · 15 of 24 positions shown · non-contrast
Comparison: Abdominopelvic CT earlier today. Lateral chest sound
06/30/2020

CLINICAL DATA: Hematuria.  Assess for continued clot burden.

EXAM:
LIMITED ULTRASOUND OF PELVIS
TECHNIQUE: Limited transabdominal ultrasound examination of the pelvis was
performed.

[Series 1: us renal mc & wl · 24 acquisitions, 15 frames shown]
[im 1/24]
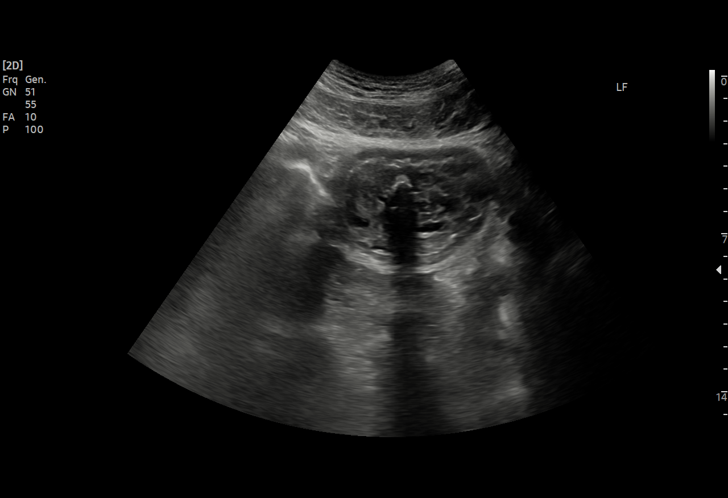
[im 3/24]
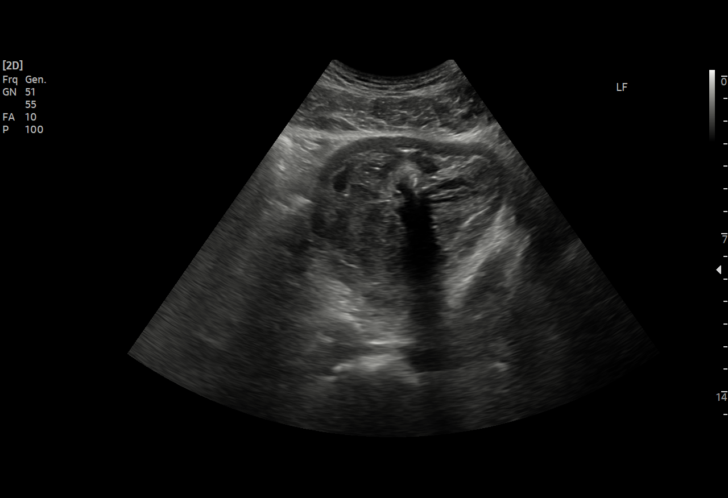
[im 5/24]
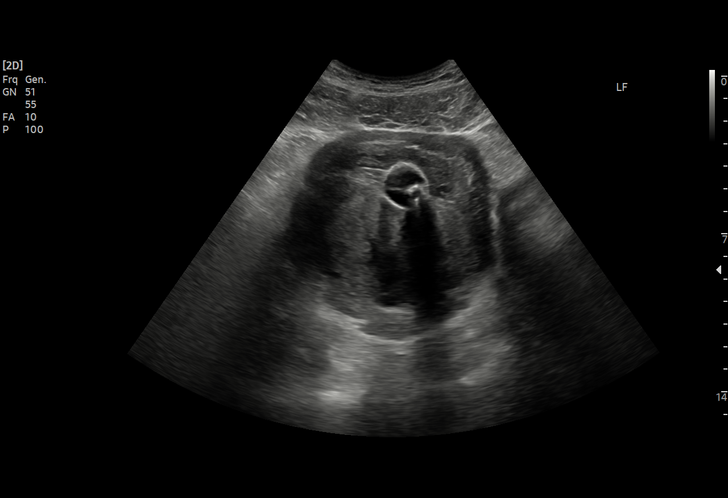
[im 6/24]
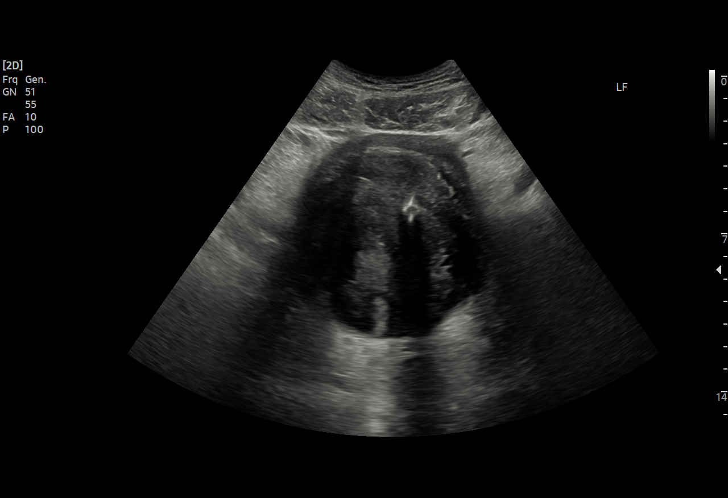
[im 8/24]
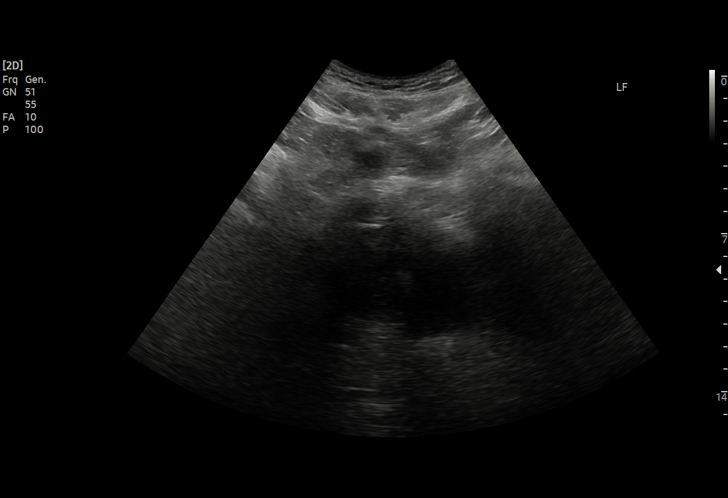
[im 9/24]
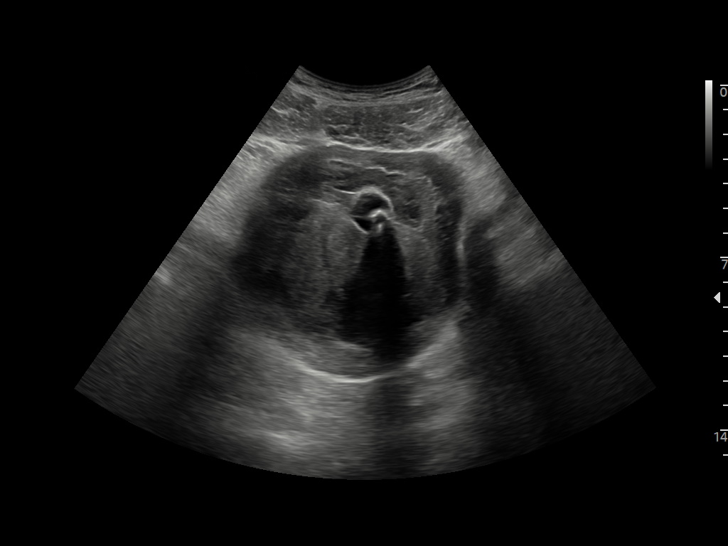
[im 11/24]
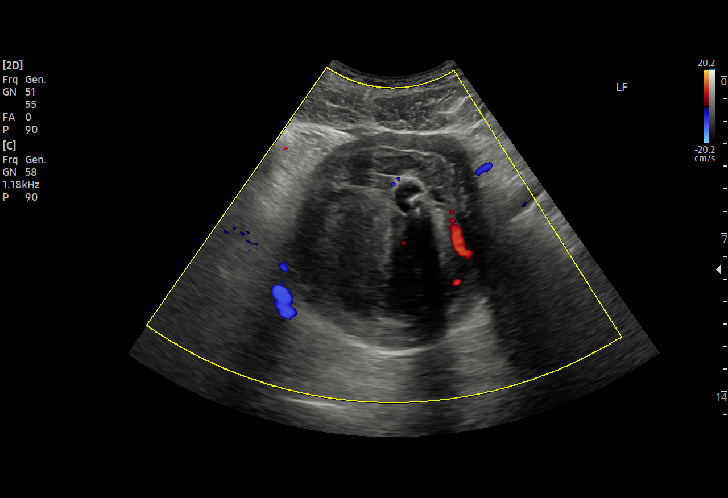
[im 13/24]
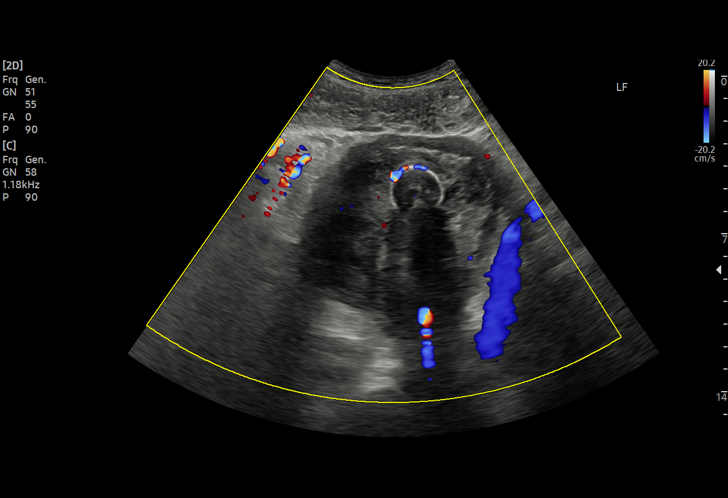
[im 14/24]
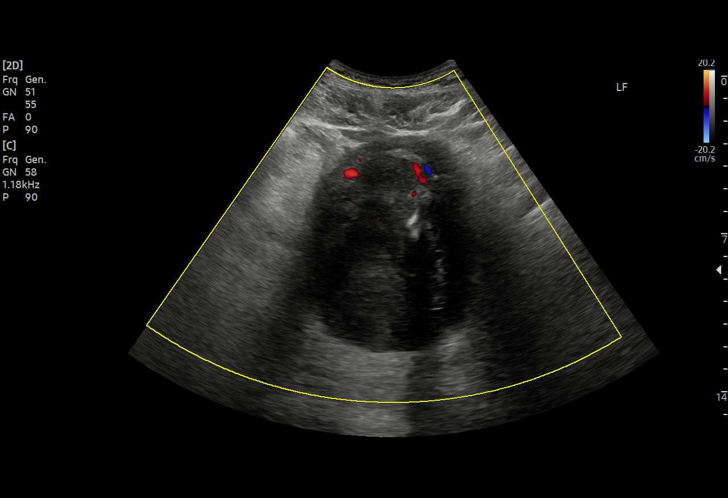
[im 16/24]
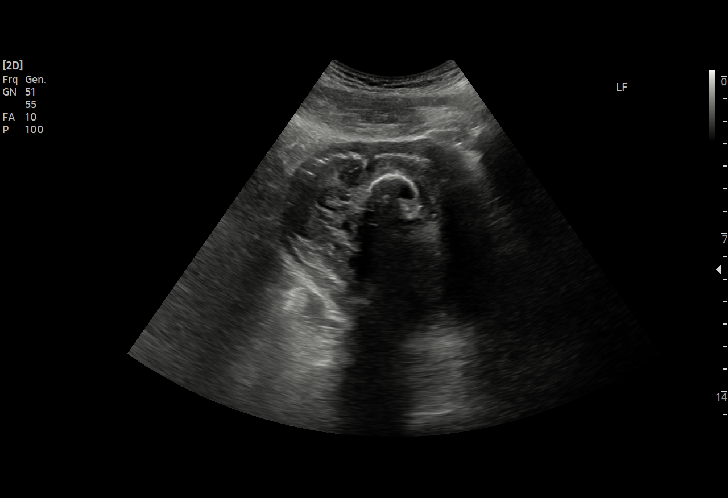
[im 17/24]
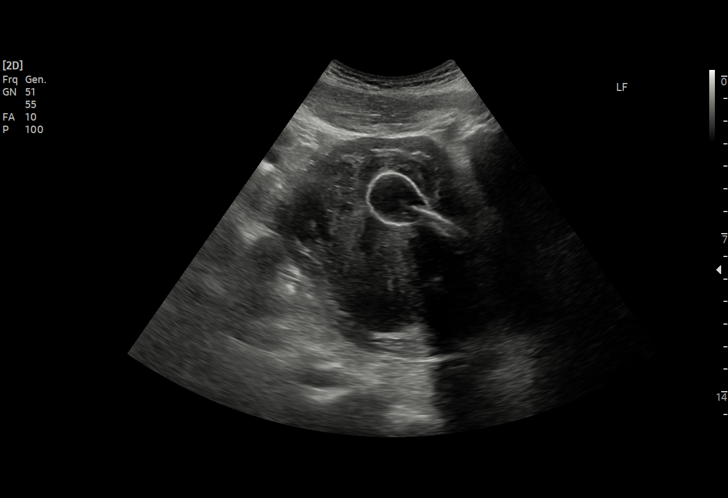
[im 19/24]
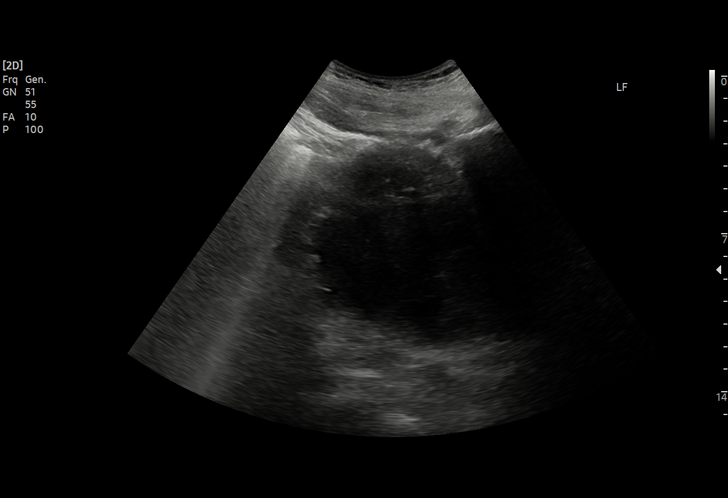
[im 21/24]
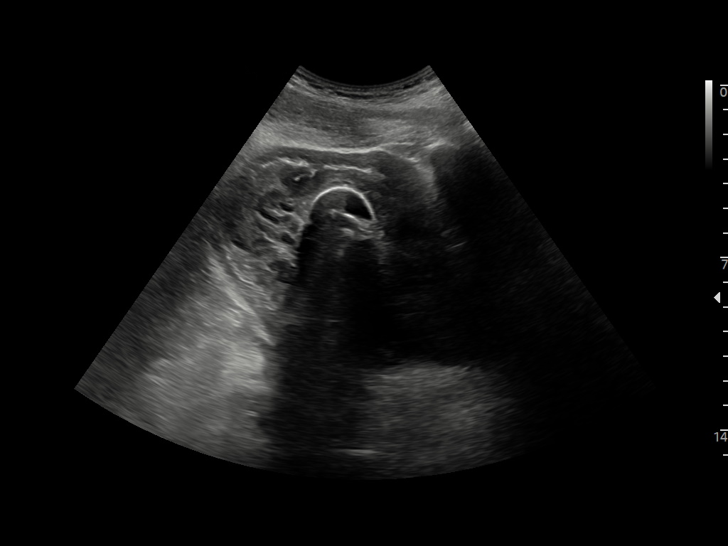
[im 22/24]
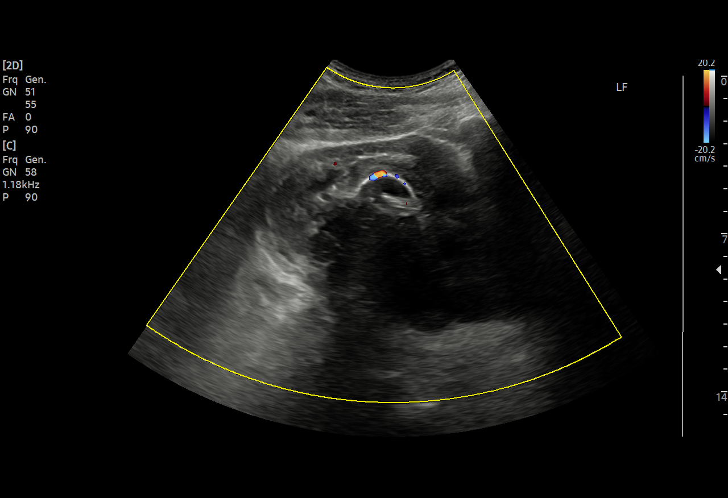
[im 24/24]
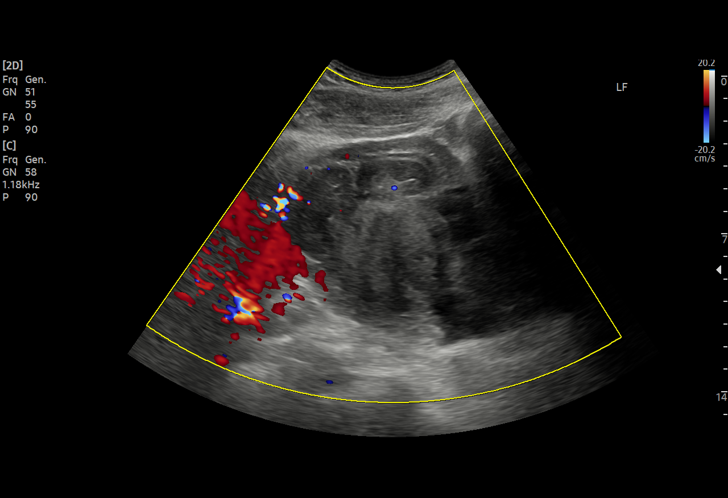

[15 of 24 positions shown; findings below may reference images not displayed]

FINDINGS: Interval placement of Foley catheter within the urinary bladder.
Heterogeneous material within the bladder lumen, difficult to
delineate intraluminal material from bladder wall as the bladder is
decompressed. Bladder base lesion on CT is difficult to accurately
visualize, the Foley appears elevated.
IMPRESSION: Foley catheter decompresses the urinary bladder. Heterogeneous
material within the bladder which is difficult to delineate from
adjacent wall thickening. Prostate appears elevated due to enlarged
prostate and/or bladder base lesion, seen on CT.

## 2022-03-21 ENCOUNTER — Emergency Department (HOSPITAL_COMMUNITY): Payer: No Typology Code available for payment source

## 2022-03-21 ENCOUNTER — Other Ambulatory Visit: Payer: Self-pay

## 2022-03-21 ENCOUNTER — Emergency Department (HOSPITAL_COMMUNITY)
Admission: EM | Admit: 2022-03-21 | Discharge: 2022-03-21 | Discharge status: Against Medical Advice | Payer: No Typology Code available for payment source | Attending: Emergency Medicine | Admitting: Emergency Medicine

## 2022-03-21 ENCOUNTER — Encounter (HOSPITAL_COMMUNITY): Payer: Self-pay

## 2022-03-21 DIAGNOSIS — Z79899 Other long term (current) drug therapy: Secondary | ICD-10-CM | POA: Insufficient documentation

## 2022-03-21 DIAGNOSIS — R1013 Epigastric pain: Secondary | ICD-10-CM | POA: Diagnosis not present

## 2022-03-21 DIAGNOSIS — K838 Other specified diseases of biliary tract: Secondary | ICD-10-CM

## 2022-03-21 DIAGNOSIS — R7401 Elevation of levels of liver transaminase levels: Secondary | ICD-10-CM | POA: Diagnosis not present

## 2022-03-21 DIAGNOSIS — R1011 Right upper quadrant pain: Secondary | ICD-10-CM | POA: Diagnosis not present

## 2022-03-21 DIAGNOSIS — R319 Hematuria, unspecified: Secondary | ICD-10-CM | POA: Diagnosis present

## 2022-03-21 DIAGNOSIS — D494 Neoplasm of unspecified behavior of bladder: Secondary | ICD-10-CM

## 2022-03-21 LAB — CBC WITH DIFFERENTIAL/PLATELET
Abs Immature Granulocytes: 0.05 10*3/uL (ref 0.00–0.07)
Basophils Absolute: 0.1 10*3/uL (ref 0.0–0.1)
Basophils Relative: 1 %
Eosinophils Absolute: 0.2 10*3/uL (ref 0.0–0.5)
Eosinophils Relative: 2 %
HCT: 30.1 % — ABNORMAL LOW (ref 39.0–52.0)
Hemoglobin: 10.1 g/dL — ABNORMAL LOW (ref 13.0–17.0)
Immature Granulocytes: 1 %
Lymphocytes Relative: 19 %
Lymphs Abs: 1.3 10*3/uL (ref 0.7–4.0)
MCH: 31.4 pg (ref 26.0–34.0)
MCHC: 33.6 g/dL (ref 30.0–36.0)
MCV: 93.5 fL (ref 80.0–100.0)
Monocytes Absolute: 0.8 10*3/uL (ref 0.1–1.0)
Monocytes Relative: 12 %
Neutro Abs: 4.5 10*3/uL (ref 1.7–7.7)
Neutrophils Relative %: 65 %
Platelets: 323 10*3/uL (ref 150–400)
RBC: 3.22 MIL/uL — ABNORMAL LOW (ref 4.22–5.81)
RDW: 14.7 % (ref 11.5–15.5)
WBC: 6.8 10*3/uL (ref 4.0–10.5)
nRBC: 0 % (ref 0.0–0.2)

## 2022-03-21 LAB — COMPREHENSIVE METABOLIC PANEL
ALT: 305 U/L — ABNORMAL HIGH (ref 0–44)
AST: 137 U/L — ABNORMAL HIGH (ref 15–41)
Albumin: 3.4 g/dL — ABNORMAL LOW (ref 3.5–5.0)
Alkaline Phosphatase: 525 U/L — ABNORMAL HIGH (ref 38–126)
Anion gap: 8 (ref 5–15)
BUN: 18 mg/dL (ref 8–23)
CO2: 22 mmol/L (ref 22–32)
Calcium: 8.5 mg/dL — ABNORMAL LOW (ref 8.9–10.3)
Chloride: 105 mmol/L (ref 98–111)
Creatinine, Ser: 0.93 mg/dL (ref 0.61–1.24)
GFR, Estimated: >60 mL/min (ref >60)
Glucose, Bld: 145 mg/dL — ABNORMAL HIGH (ref 70–99)
Potassium: 3.4 mmol/L — ABNORMAL LOW (ref 3.5–5.1)
Sodium: 135 mmol/L (ref 135–145)
Total Bilirubin: 1.6 mg/dL — ABNORMAL HIGH (ref 0.3–1.2)
Total Protein: 6.7 g/dL (ref 6.5–8.1)

## 2022-03-21 LAB — URINALYSIS, ROUTINE W REFLEX MICROSCOPIC
Bacteria, UA: NONE SEEN
RBC / HPF: >50 RBC/hpf — ABNORMAL HIGH (ref 0–5)

## 2022-03-21 MED ORDER — ONDANSETRON HCL 4 MG/2ML IJ SOLN: 4.0000 mg | Freq: Once | INTRAMUSCULAR | Status: DC

## 2022-03-21 MED ORDER — IOHEXOL 300 MG/ML  SOLN
100.0000 mL | Freq: Once | INTRAMUSCULAR | Status: AC | PRN
  Administered 2022-03-21: 100 mL via INTRAVENOUS

## 2022-03-21 MED ORDER — SODIUM CHLORIDE 0.9 % IV BOLUS: 1000.0000 mL | Freq: Once | INTRAVENOUS | Status: DC

## 2022-03-21 MED ORDER — SODIUM CHLORIDE (PF) 0.9 % IJ SOLN
INTRAMUSCULAR | Status: AC
  Filled 2022-03-21: qty 50

## 2022-03-21 MED ORDER — ONDANSETRON HCL 4 MG PO TABS: 4.0000 mg | ORAL_TABLET | Freq: Four times a day (QID) | ORAL | 0 refills | Status: DC

## 2022-03-21 MED ORDER — OXYCODONE HCL 5 MG PO TABS: 5.0000 mg | ORAL_TABLET | Freq: Four times a day (QID) | ORAL | 0 refills | Status: DC | PRN

## 2022-03-21 NOTE — ED Triage Notes (Signed)
Pt c/o hematuria and abdominal pain x4-5 days. Pt denies urinary burning or frequency. Pt states he had this problem over a year ago and was seen at [facility] for it. Pt c/o slight abdominal pain that comes and goes.

## 2022-03-21 NOTE — ED Provider Triage Note (Cosign Needed Addendum)
Emergency Medicine Provider Triage Evaluation Note  Jettson LATRAVIS GRINE , a 72 y.o. male  was evaluated in triage.  Pt complains of diarrhea, generalized weakness and mild abdominal pain for the past 4 to 5 days he states he had have "surgery" once with the urologist when this happened.  Review of Systems  Positive: Area, mild dysuria, malaise Negative: Fever, vomiting, flank pain  Physical Exam  There were no vitals taken for this visit. Gen:   Awake, no distress   Resp:  Normal effort  MSK:   Moves extremities without difficulty  Other:  Soft nontender x 4 quads  Medical Decision Making  Medically screening exam initiated at 12:44 PM.  Appropriate orders placed.  Isamu R Chrismer was informed that the remainder of the evaluation will be completed by another provider, this initial triage assessment does not replace that evaluation, and the importance of remaining in the ED until their evaluation is complete.     Gwenevere Abbot, PA-C 03/21/22 [address], [place] 03/21/22 1427

## 2022-03-21 NOTE — ED Provider Notes (Signed)
[facility] DEPT Provider Note   CSN: [id number] Arrival date & time: 03/21/22  1228     History  Chief Complaint  Patient presents with   Hematuria   Abdominal Pain    Eddie Mendoza is a 72 y.o. male.  He presents the ER for 4 days of hematuria also notes he has been having some fatigue and upper abdominal pain.  He states he had a procedure to have his bladder "cleaned out" due to bleeding and blockage in the past.  Denies any ongoing need for allergy care.  He is able to empty his bladder.   Hematuria Associated symptoms include abdominal pain.  Abdominal Pain Associated symptoms: hematuria        Home Medications Prior to Admission medications   Medication Sig Start Date End Date Taking? Authorizing Provider  chlorthalidone (HYGROTON) 25 MG tablet Take 1 tablet by mouth daily. 07/29/20   [provider]  finasteride (PROSCAR) 5 MG tablet Take 1 tablet (5 mg total) by mouth daily. 07/02/20   Patrecia Pour, MD  lidocaine (XYLOCAINE) 5 % ointment Apply 1 application topically 4 (four) times daily as needed for mild pain or moderate pain. 04/15/20   [provider]  losartan (COZAAR) 100 MG tablet Take 50 mg by mouth daily. 07/29/20   [provider]  polyethylene glycol (MIRALAX / GLYCOLAX) 17 g packet Take 17 g by mouth daily as needed for mild constipation. 06/22/20   Pokhrel, Corrie Mckusick, MD  tamsulosin (FLOMAX) 0.4 MG CAPS capsule Take 1 capsule (0.4 mg total) by mouth daily. 07/01/20   Patrecia Pour, MD      Allergies    Patient has no known allergies.    Review of Systems   Review of Systems  Gastrointestinal:  Positive for abdominal pain.  Genitourinary:  Positive for hematuria.    Physical Exam Updated Vital Signs BP (!) 143/68   Pulse 83   Temp 98.2 F (36.8 C) (Oral)   Resp (P) 18   SpO2 98%  Physical Exam Vitals and nursing note reviewed.  Constitutional:      General: He is not in acute distress.     Appearance: He is well-developed.  HENT:     Head: Normocephalic and atraumatic.  Eyes:     Conjunctiva/sclera: Conjunctivae normal.  Cardiovascular:     Rate and Rhythm: Normal rate and regular rhythm.     Heart sounds: No murmur heard. Pulmonary:     Effort: Pulmonary effort is normal. No respiratory distress.     Breath sounds: Normal breath sounds.  Abdominal:     Palpations: Abdomen is soft. There is no mass.     Tenderness: There is abdominal tenderness in the right upper quadrant and epigastric area.  Musculoskeletal:        General: No swelling.     Cervical back: Neck supple.  Skin:    General: Skin is warm and dry.     Capillary Refill: Capillary refill takes less than 2 seconds.  Neurological:     General: No focal deficit present.     Mental Status: He is alert and oriented to person, place, and time.  Psychiatric:        Mood and Affect: Mood normal.     ED Results / Procedures / Treatments   Labs (all labs ordered are listed, but only abnormal results are displayed) Labs Reviewed  COMPREHENSIVE METABOLIC PANEL - Abnormal; Notable for the following components:  Result Value   Potassium 3.4 (*)    Glucose, Bld 145 (*)    Calcium 8.5 (*)    Albumin 3.4 (*)    AST 137 (*)    ALT 305 (*)    Alkaline Phosphatase 525 (*)    Total Bilirubin 1.6 (*)    All other components within normal limits  CBC WITH DIFFERENTIAL/PLATELET - Abnormal; Notable for the following components:   RBC 3.22 (*)    Hemoglobin 10.1 (*)    HCT 30.1 (*)    All other components within normal limits  URINALYSIS, ROUTINE W REFLEX MICROSCOPIC - Abnormal; Notable for the following components:   Color, Urine RED (*)    APPearance TURBID (*)    Glucose, UA   (*)    Value: TEST NOT REPORTED DUE TO COLOR INTERFERENCE OF URINE PIGMENT   Hgb urine dipstick   (*)    Value: TEST NOT REPORTED DUE TO COLOR INTERFERENCE OF URINE PIGMENT   Bilirubin Urine   (*)    Value: TEST NOT REPORTED DUE TO  COLOR INTERFERENCE OF URINE PIGMENT   Ketones, ur   (*)    Value: TEST NOT REPORTED DUE TO COLOR INTERFERENCE OF URINE PIGMENT   Protein, ur   (*)    Value: TEST NOT REPORTED DUE TO COLOR INTERFERENCE OF URINE PIGMENT   Nitrite   (*)    Value: TEST NOT REPORTED DUE TO COLOR INTERFERENCE OF URINE PIGMENT   Leukocytes,Ua   (*)    Value: TEST NOT REPORTED DUE TO COLOR INTERFERENCE OF URINE PIGMENT   RBC / HPF >50 (*)    All other components within normal limits  LIPASE, BLOOD    EKG None  Radiology CT ABDOMEN PELVIS W CONTRAST  Result Date: 03/21/2022 CLINICAL DATA:  Hematuria, metastatic disease evaluation. * Tracking Code: BO * EXAM: CT ABDOMEN AND PELVIS WITH CONTRAST TECHNIQUE: Multidetector CT imaging of the abdomen and pelvis was performed using the standard protocol following bolus administration of intravenous contrast. RADIATION DOSE REDUCTION: This exam was performed according to the departmental dose-optimization program which includes automated exposure control, adjustment of the mA and/or kV according to patient size and/or use of iterative reconstruction technique. CONTRAST:  180m OMNIPAQUE IOHEXOL 300 MG/ML  SOLN COMPARISON:  CT abdomen pelvis Aug 12, 2020. FINDINGS: Lower chest: No acute abnormality Hepatobiliary: Hyperdense focus measuring 9 mm in the inferior right lobe of the liver segment V on image 35/2. Increased abnormal thickening of the neck of the gallbladder extending into the hepatic hilum with similar abnormal thickening of the pharyngeal gallbladder cap. New intra or extrahepatic biliary ductal dilation to the level of this thickening with normal caliber common duct distal to this area. Pancreas: 2 new cystic lesions in the pancreatic head 1 measuring 2.3 x 2.9 cm on image 30/2 in the other measuring 14 mm on image 27/2. No pancreatic ductal dilation. Spleen: No splenomegaly or focal splenic lesion. Adrenals/Urinary Tract: Bilateral adrenal glands appear normal. Kidneys  demonstrate symmetric enhancement. No hydronephrosis. Irregular heterogeneous frondlike mass fills a significant portion of the urinary bladder lumen measuring 10.6 x 8.7 cm on image 67/2. Stomach/Bowel: Vascular/Lymphatic: Enlarged retroperitoneal porta hepatis in mesenteric lymph nodes. For reference: -Porta hepatis lymph node measures 14 mm in short axis on image 23/2. -Lymph node measures 19 mm in short axis on image 22/2. -Low aortocaval lymph node near the bifurcation measures 10 mm in short axis on image 47/2. Prominent mesorectal lymph node measures 6 mm in  short axis on image 75/2 Reproductive: Heterogeneous enhancement of an enlarged prostate gland measuring proximally 7.4 x 5.5 x 7.6 cm. Other: No significant abdominopelvic free fluid. Musculoskeletal: No aggressive lytic or blastic lesion of bone. Multilevel degenerative change of the spine. Degenerative change of the bilateral hips and SI joints. Chronic osseous changes of the pubic symphysis. Similar appearance of the stellate sclerotic lesion in the intratrochanteric right femur on image 105/2 and left acetabulum on image 118/5 likely reflecting benign bone islands. Multilevel degenerative changes spine. Degenerative change of the bilateral hips and SI joints. Chronic osseous changes of the pubic symphysis. IMPRESSION: 1. Irregular heterogeneous frondlike 10.6 cm urinary bladder mass, highly suspicious for primary bladder neoplasm. 2. Increased abnormal thickening of the neck of the gallbladder extending into the hepatic hilum with new biliary ductal dilation to the level of this thickening and normal caliber common duct distal to this area. Findings are highly suspicious for gallbladder/biliary neoplasm, suggest GI consult. 3. Enlarged retroperitoneal, porta hepatis and mesenteric lymph nodes, consistent with metastatic disease. 4. Prominent mesorectal lymph node measures 6 mm in short axis, nonspecific but suspicious for metastatic disease. 5.  Hyperdense focus measuring 9 mm in the inferior right lobe of the liver segment V, is nonspecific. Suggest further evaluation by pre and postcontrast abdominal MRI. 6. Two new cystic lesions in the pancreatic head measure up to 2.9 cm, suggest attention on aforementioned MRI as well as possible fluid sampling by EUS/ERCP when/if performed for the above gallbladder/biliary abnormality. 7. Heterogeneous enhancement of an enlarged prostate gland measuring proximally 7.4 x 5.5 x 7.6 cm. Electronically Signed   By: Dahlia Bailiff M.D.   On: 03/21/2022 15:29    Procedures Procedures    Medications Ordered in ED Medications  sodium chloride (PF) 0.9 % injection (has no administration in time range)  iohexol (OMNIPAQUE) 300 MG/ML solution 100 mL (100 mLs Intravenous Contrast Given 03/21/22 1444)    ED Course/ Medical Decision Making/ A&P Clinical Course as of 03/21/22 1536  Mon Mar 21, 2022  1410 Glucose, UA(!): TEST NOT REPORTED DUE TO COLOR INTERFERENCE OF URINE PIGMENT [CB]  1410 Leukocytes,Ua(!): TEST NOT REPORTED DUE TO COLOR INTERFERENCE OF URINE PIGMENT [CB]  1410 Glucose, UA(!): TEST NOT REPORTED DUE TO COLOR INTERFERENCE OF URINE PIGMENT [CB]  1410 Color, Urine(!): RED [CB]  1410 RBC / HPF(!): >50 [CB]  1410 WBC, UA: 0-5 [CB]  1410 Bacteria, UA: NONE SEEN [CB]  1410 Squamous Epithelial / LPF: 0-5 [CB]    Clinical Course User Index [CB] Gwenevere Abbot, PA-C                           Medical Decision Making This patient presents to the ED for concern of trip and abdominal pain, this involves an extensive number of treatment options, and is a complaint that carries with it a high risk of complications and morbidity.  The differential diagnosis includes GI: Cholecystitis, bladder cancer, pancreatitis, other     Additional history obtained:  Additional history obtained from EMR External records from outside source obtained and reviewed including previous urology note   Lab  Tests:  I Ordered, and personally interpreted labs.  The pertinent results include: Elevated LFTs, mild anemia   Imaging Studies ordered:  I ordered imaging studies including CT abdomen pelvis I independently visualized and interpreted imaging which showed likely neoplasm of bladder, also showed gallbladder/biliary neoplasm I agree with the radiologist interpretation  Consultations Obtained:  I requested consultation w the GI doctor, Dr. Randel Pigg,  and discussed lab and imaging findings as well as pertinent plan - they recommend: Would have planned on inpatient management and likely stent, patient leaving AMA.  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  Does not want to stay in the hospital, he states he does not want any workup or treatment. I have reviewed the patients home medicines and have made adjustments as needed   Test / Admission - Considered:  Consented and planned on admission but patient is leaving his medical advice.  He is competent to make this decision and his wife is also at bedside and comfortable with his decision as well. He states he just does not want any treatment.  There were no barriers to him staying, he simply does not want to stay.  Discussed the risk of leaving including progression of his disease, complete biliary obstruction causing severe pain infection, discussed risk of premature death.  Patient verbalized understanding and very much wants to go home.  He was provided with prescription for pain and nausea medicine to use.  Follow-up with oncology and GI and urology  and was invited to come back if he changes mind or if his symptoms progress.    Amount and/or Complexity of Data Reviewed Labs: ordered. Decision-making details documented in ED Course. Radiology: ordered.  Risk Prescription drug management.           Final Clinical Impression(s) / ED Diagnoses Final diagnoses:  None    Rx / DC Orders ED Discharge Orders     None         Darci Current 03/21/22 2001    Drenda Freeze, MD 03/28/22 [phone]

## 2022-03-21 NOTE — Discharge Instructions (Signed)
Today for abdominal pain and blood in the urine.  Your CAT scan showed that you likely have cancer in your bladder and located also in your liver.  It seems to be causing a blockage in the bile duct.  Our plan was to see if GI was able to do procedure to keep this open.  Since you would like to go home.  Please make sure you follow-up with the cancer doctor as well as the GI doctor and urologist.  If you change your mind about your care or have any new or worsening symptoms come back to the ER.  You prescribed some pain medicine and nausea medicine for your symptoms at home.

## 2022-04-13 ENCOUNTER — Emergency Department (HOSPITAL_COMMUNITY)
Admission: EM | Admit: 2022-04-13 | Discharge: 2022-04-13 | Discharge status: Against Medical Advice | Payer: No Typology Code available for payment source | Attending: Emergency Medicine | Admitting: Emergency Medicine

## 2022-04-13 DIAGNOSIS — L299 Pruritus, unspecified: Secondary | ICD-10-CM | POA: Diagnosis not present

## 2022-04-13 DIAGNOSIS — Z8551 Personal history of malignant neoplasm of bladder: Secondary | ICD-10-CM | POA: Insufficient documentation

## 2022-04-13 DIAGNOSIS — R109 Unspecified abdominal pain: Secondary | ICD-10-CM | POA: Diagnosis present

## 2022-04-13 LAB — CBC WITH DIFFERENTIAL/PLATELET
Abs Immature Granulocytes: 0.05 10*3/uL (ref 0.00–0.07)
Basophils Absolute: 0.1 10*3/uL (ref 0.0–0.1)
Basophils Relative: 1 %
Eosinophils Absolute: 0.2 10*3/uL (ref 0.0–0.5)
Eosinophils Relative: 3 %
HCT: 28.2 % — ABNORMAL LOW (ref 39.0–52.0)
Hemoglobin: 9.6 g/dL — ABNORMAL LOW (ref 13.0–17.0)
Immature Granulocytes: 1 %
Lymphocytes Relative: 13 %
Lymphs Abs: 1 10*3/uL (ref 0.7–4.0)
MCH: 32 pg (ref 26.0–34.0)
MCHC: 34 g/dL (ref 30.0–36.0)
MCV: 94 fL (ref 80.0–100.0)
Monocytes Absolute: 0.9 10*3/uL (ref 0.1–1.0)
Monocytes Relative: 11 %
Neutro Abs: 5.8 10*3/uL (ref 1.7–7.7)
Neutrophils Relative %: 71 %
Platelets: 389 10*3/uL (ref 150–400)
RBC: 3 MIL/uL — ABNORMAL LOW (ref 4.22–5.81)
RDW: 16.8 % — ABNORMAL HIGH (ref 11.5–15.5)
WBC: 8 10*3/uL (ref 4.0–10.5)
nRBC: 0 % (ref 0.0–0.2)

## 2022-04-13 LAB — COMPREHENSIVE METABOLIC PANEL
ALT: 85 U/L — ABNORMAL HIGH (ref 0–44)
AST: 68 U/L — ABNORMAL HIGH (ref 15–41)
Albumin: 3.1 g/dL — ABNORMAL LOW (ref 3.5–5.0)
Alkaline Phosphatase: 538 U/L — ABNORMAL HIGH (ref 38–126)
Anion gap: 9 (ref 5–15)
BUN: 25 mg/dL — ABNORMAL HIGH (ref 8–23)
CO2: 21 mmol/L — ABNORMAL LOW (ref 22–32)
Calcium: 8.7 mg/dL — ABNORMAL LOW (ref 8.9–10.3)
Chloride: 106 mmol/L (ref 98–111)
Creatinine, Ser: 0.79 mg/dL (ref 0.61–1.24)
GFR, Estimated: >60 mL/min (ref >60)
Glucose, Bld: 124 mg/dL — ABNORMAL HIGH (ref 70–99)
Potassium: 4.2 mmol/L (ref 3.5–5.1)
Sodium: 136 mmol/L (ref 135–145)
Total Bilirubin: 7 mg/dL — ABNORMAL HIGH (ref 0.3–1.2)
Total Protein: 7.9 g/dL (ref 6.5–8.1)

## 2022-04-13 LAB — URINALYSIS, ROUTINE W REFLEX MICROSCOPIC
Glucose, UA: NEGATIVE mg/dL
Hgb urine dipstick: NEGATIVE
Ketones, ur: NEGATIVE mg/dL
Nitrite: NEGATIVE
Protein, ur: 30 mg/dL — AB
Specific Gravity, Urine: 1.017 (ref 1.005–1.030)
pH: 5 (ref 5.0–8.0)

## 2022-04-13 LAB — LIPASE, BLOOD: Lipase: 48 U/L (ref 11–51)

## 2022-04-13 NOTE — ED Provider Triage Note (Signed)
Emergency Medicine Provider Triage Evaluation Note  Eddie Mendoza , a 72 y.o. male  was evaluated in triage.  Pt complains of abdominal pain and itching on his lower back.  Patient was evaluated in the ED on 03/21/2022 for abdominal pain and hematuria.  He was found to have bladder cancer. Back itching started after being discharged. Patient states he has not follow-up with oncology and [facility] as he thought they would call him.  Patient states urinary symptoms still persist but the pain has gotten better with oxycodone.  Review of Systems  Positive: As above Negative: As above  Physical Exam  BP (!) 145/77 (BP Location: Right Arm)   Pulse 60   Temp 97.9 F (36.6 C) (Oral)   Resp 18   SpO2 100%  Gen:   Awake, no distress   Resp:  Normal effort  MSK:   Moves extremities without difficulty  Other:    Medical Decision Making  Medically screening exam initiated at 1:39 PM.  Appropriate orders placed.  Eddie Mendoza was informed that the remainder of the evaluation will be completed by another provider, this initial triage assessment does not replace that evaluation, and the importance of remaining in the ED until their evaluation is complete.    Eddie Mendoza, [place] 04/13/22 1341

## 2022-04-13 NOTE — ED Triage Notes (Signed)
Pt c/o skin irritation to his L arm, abdomen, and back ongoing since receiving IV contrast dye on 1/1. Pt was diagnosed with bladder cancer, but unable to follow up with oncology. Pt reports abdominal pain, but denies any N/V/D.

## 2022-04-14 ENCOUNTER — Encounter: Payer: Self-pay | Admitting: *Deleted

## 2022-04-14 NOTE — Progress Notes (Signed)
Received a message from Dr Marin Olp on 04/13/22. Message was received by ED physician requesting office follow up. Patient lives in [place] and will be better served by going to [facility]. Reached out to Adonis Huguenin, Nurse Navigator and she will reach out to patient to schedule appointment.   Oncology Nurse Navigator Documentation     04/14/2022    8:00 AM  Oncology Nurse Navigator Flowsheets  Navigator Location [facility]  Navigator Encounter Type Other:  Patient Visit Type MedOnc  Interventions Coordination of Care  Coordination of Care Other  Time Spent with Patient 15

## 2022-04-18 NOTE — ED Notes (Signed)
Pt states someone called him and told him to come pick up his discharge instructions from last visit. He left AMA last visit.

## 2022-05-11 ENCOUNTER — Inpatient Hospital Stay (HOSPITAL_COMMUNITY)
Admission: EM | Admit: 2022-05-11 | Discharge: 2022-05-17 | DRG: 444 | Disposition: A | Discharge status: Home / Self Care | Payer: No Typology Code available for payment source | Attending: Student in an Organized Health Care Education/Training Program | Admitting: Student in an Organized Health Care Education/Training Program

## 2022-05-11 ENCOUNTER — Emergency Department (HOSPITAL_COMMUNITY): Payer: No Typology Code available for payment source

## 2022-05-11 ENCOUNTER — Other Ambulatory Visit: Payer: Self-pay

## 2022-05-11 DIAGNOSIS — R591 Generalized enlarged lymph nodes: Secondary | ICD-10-CM | POA: Diagnosis present

## 2022-05-11 DIAGNOSIS — D72829 Elevated white blood cell count, unspecified: Secondary | ICD-10-CM

## 2022-05-11 DIAGNOSIS — K862 Cyst of pancreas: Secondary | ICD-10-CM | POA: Diagnosis present

## 2022-05-11 DIAGNOSIS — Y92239 Unspecified place in hospital as the place of occurrence of the external cause: Secondary | ICD-10-CM | POA: Diagnosis not present

## 2022-05-11 DIAGNOSIS — K859 Acute pancreatitis without necrosis or infection, unspecified: Secondary | ICD-10-CM | POA: Diagnosis not present

## 2022-05-11 DIAGNOSIS — K449 Diaphragmatic hernia without obstruction or gangrene: Secondary | ICD-10-CM | POA: Diagnosis present

## 2022-05-11 DIAGNOSIS — I10 Essential (primary) hypertension: Secondary | ICD-10-CM | POA: Diagnosis present

## 2022-05-11 DIAGNOSIS — E871 Hypo-osmolality and hyponatremia: Secondary | ICD-10-CM | POA: Diagnosis present

## 2022-05-11 DIAGNOSIS — N3289 Other specified disorders of bladder: Secondary | ICD-10-CM | POA: Insufficient documentation

## 2022-05-11 DIAGNOSIS — D649 Anemia, unspecified: Secondary | ICD-10-CM

## 2022-05-11 DIAGNOSIS — E872 Acidosis, unspecified: Secondary | ICD-10-CM

## 2022-05-11 DIAGNOSIS — R180 Malignant ascites: Secondary | ICD-10-CM | POA: Diagnosis present

## 2022-05-11 DIAGNOSIS — R748 Abnormal levels of other serum enzymes: Secondary | ICD-10-CM

## 2022-05-11 DIAGNOSIS — C801 Malignant (primary) neoplasm, unspecified: Principal | ICD-10-CM

## 2022-05-11 DIAGNOSIS — F17210 Nicotine dependence, cigarettes, uncomplicated: Secondary | ICD-10-CM | POA: Diagnosis present

## 2022-05-11 DIAGNOSIS — R319 Hematuria, unspecified: Secondary | ICD-10-CM | POA: Diagnosis present

## 2022-05-11 DIAGNOSIS — K2970 Gastritis, unspecified, without bleeding: Secondary | ICD-10-CM | POA: Diagnosis present

## 2022-05-11 DIAGNOSIS — Z8551 Personal history of malignant neoplasm of bladder: Secondary | ICD-10-CM

## 2022-05-11 DIAGNOSIS — R634 Abnormal weight loss: Secondary | ICD-10-CM | POA: Diagnosis present

## 2022-05-11 DIAGNOSIS — K8689 Other specified diseases of pancreas: Secondary | ICD-10-CM | POA: Insufficient documentation

## 2022-05-11 DIAGNOSIS — K831 Obstruction of bile duct: Secondary | ICD-10-CM | POA: Diagnosis not present

## 2022-05-11 DIAGNOSIS — R202 Paresthesia of skin: Secondary | ICD-10-CM | POA: Diagnosis present

## 2022-05-11 DIAGNOSIS — K8309 Other cholangitis: Secondary | ICD-10-CM | POA: Diagnosis present

## 2022-05-11 DIAGNOSIS — G928 Other toxic encephalopathy: Secondary | ICD-10-CM | POA: Diagnosis not present

## 2022-05-11 DIAGNOSIS — F1010 Alcohol abuse, uncomplicated: Secondary | ICD-10-CM | POA: Diagnosis present

## 2022-05-11 DIAGNOSIS — K5730 Diverticulosis of large intestine without perforation or abscess without bleeding: Secondary | ICD-10-CM | POA: Diagnosis present

## 2022-05-11 DIAGNOSIS — G479 Sleep disorder, unspecified: Secondary | ICD-10-CM | POA: Diagnosis present

## 2022-05-11 DIAGNOSIS — Z79899 Other long term (current) drug therapy: Secondary | ICD-10-CM

## 2022-05-11 DIAGNOSIS — R7401 Elevation of levels of liver transaminase levels: Secondary | ICD-10-CM

## 2022-05-11 DIAGNOSIS — R509 Fever, unspecified: Secondary | ICD-10-CM | POA: Diagnosis not present

## 2022-05-11 DIAGNOSIS — F1090 Alcohol use, unspecified, uncomplicated: Secondary | ICD-10-CM

## 2022-05-11 DIAGNOSIS — D62 Acute posthemorrhagic anemia: Secondary | ICD-10-CM | POA: Insufficient documentation

## 2022-05-11 DIAGNOSIS — E119 Type 2 diabetes mellitus without complications: Secondary | ICD-10-CM | POA: Diagnosis present

## 2022-05-11 DIAGNOSIS — Z6831 Body mass index (BMI) 31.0-31.9, adult: Secondary | ICD-10-CM

## 2022-05-11 DIAGNOSIS — L299 Pruritus, unspecified: Secondary | ICD-10-CM | POA: Diagnosis present

## 2022-05-11 DIAGNOSIS — N400 Enlarged prostate without lower urinary tract symptoms: Secondary | ICD-10-CM | POA: Diagnosis present

## 2022-05-11 DIAGNOSIS — T424X5A Adverse effect of benzodiazepines, initial encounter: Secondary | ICD-10-CM | POA: Diagnosis not present

## 2022-05-11 DIAGNOSIS — E876 Hypokalemia: Secondary | ICD-10-CM | POA: Diagnosis present

## 2022-05-11 DIAGNOSIS — C7889 Secondary malignant neoplasm of other digestive organs: Secondary | ICD-10-CM | POA: Diagnosis present

## 2022-05-11 DIAGNOSIS — E669 Obesity, unspecified: Secondary | ICD-10-CM | POA: Diagnosis present

## 2022-05-11 LAB — CBC WITH DIFFERENTIAL/PLATELET
Abs Immature Granulocytes: 0.39 10*3/uL — ABNORMAL HIGH (ref 0.00–0.07)
Basophils Absolute: 0.1 10*3/uL (ref 0.0–0.1)
Basophils Relative: 1 %
Eosinophils Absolute: 0.2 10*3/uL (ref 0.0–0.5)
Eosinophils Relative: 1 %
HCT: 23.1 % — ABNORMAL LOW (ref 39.0–52.0)
Hemoglobin: 8.2 g/dL — ABNORMAL LOW (ref 13.0–17.0)
Immature Granulocytes: 3 %
Lymphocytes Relative: 4 %
Lymphs Abs: 0.4 10*3/uL — ABNORMAL LOW (ref 0.7–4.0)
MCH: 31.2 pg (ref 26.0–34.0)
MCHC: 35.5 g/dL (ref 30.0–36.0)
MCV: 87.8 fL (ref 80.0–100.0)
Monocytes Absolute: 1.3 10*3/uL — ABNORMAL HIGH (ref 0.1–1.0)
Monocytes Relative: 11 %
Neutro Abs: 10.1 10*3/uL — ABNORMAL HIGH (ref 1.7–7.7)
Neutrophils Relative %: 80 %
Platelets: 479 10*3/uL — ABNORMAL HIGH (ref 150–400)
RBC: 2.63 MIL/uL — ABNORMAL LOW (ref 4.22–5.81)
RDW: 23.2 % — ABNORMAL HIGH (ref 11.5–15.5)
WBC: 12.5 10*3/uL — ABNORMAL HIGH (ref 4.0–10.5)
nRBC: 0 % (ref 0.0–0.2)

## 2022-05-11 LAB — COMPREHENSIVE METABOLIC PANEL
ALT: 59 U/L — ABNORMAL HIGH (ref 0–44)
AST: 50 U/L — ABNORMAL HIGH (ref 15–41)
Albumin: 1.8 g/dL — ABNORMAL LOW (ref 3.5–5.0)
Alkaline Phosphatase: 380 U/L — ABNORMAL HIGH (ref 38–126)
Anion gap: 9 (ref 5–15)
BUN: 15 mg/dL (ref 8–23)
CO2: 17 mmol/L — ABNORMAL LOW (ref 22–32)
Calcium: 8 mg/dL — ABNORMAL LOW (ref 8.9–10.3)
Chloride: 107 mmol/L (ref 98–111)
Creatinine, Ser: 0.87 mg/dL (ref 0.61–1.24)
GFR, Estimated: >60 mL/min (ref >60)
Glucose, Bld: 122 mg/dL — ABNORMAL HIGH (ref 70–99)
Potassium: 3.5 mmol/L (ref 3.5–5.1)
Sodium: 133 mmol/L — ABNORMAL LOW (ref 135–145)
Total Bilirubin: 18.4 mg/dL (ref 0.3–1.2)
Total Protein: 6 g/dL — ABNORMAL LOW (ref 6.5–8.1)

## 2022-05-11 LAB — LIPASE, BLOOD: Lipase: 41 U/L (ref 11–51)

## 2022-05-11 MED ORDER — HYDROXYZINE HCL 25 MG PO TABS
25.0000 mg | ORAL_TABLET | Freq: Three times a day (TID) | ORAL | Status: DC | PRN
  Administered 2022-05-11: 25 mg via ORAL
  Filled 2022-05-11: qty 1

## 2022-05-11 MED ORDER — SODIUM CHLORIDE 0.9 % IV BOLUS
1000.0000 mL | Freq: Once | INTRAVENOUS | Status: AC
  Administered 2022-05-11: 1000 mL via INTRAVENOUS

## 2022-05-11 MED ORDER — IOHEXOL 350 MG/ML SOLN
75.0000 mL | Freq: Once | INTRAVENOUS | Status: AC | PRN
  Administered 2022-05-11: 75 mL via INTRAVENOUS

## 2022-05-11 NOTE — ED Triage Notes (Signed)
Pt arrives from [place] with multiple complaints: swollen abdomen and legs with associated DOE x 3 weeks and skin itching/irritation x6-8 weeks. Family also reports yellowing of eyes and facial skin being darker than usual.

## 2022-05-11 NOTE — ED Provider Triage Note (Signed)
Emergency Medicine Provider Triage Evaluation Note  Eddie Mendoza , a 72 y.o. male  was evaluated in triage.  Pt complains of global itching, scleral icterus, abdominal distention, abdominal pain.  Patient reports symptoms been ongoing for quite some time.  The patient was seen on 1/24 in this ED, had bilirubin of 7 and noted to have pancreatic mass as well as neoplasm of bladder.  Patient reports he never followed up on these studies.  The patient states he was seen at at [facility] today and sent here for further evaluation.  Patient denies nausea, vomiting, fevers, chest pain.  Patient does endorse shortness of breath however reports this is secondary to abdominal distention.  Review of Systems  Positive:  Negative:   Physical Exam  BP (!) 144/61 (BP Location: Right Arm)   Pulse 72   Temp 98.9 F (37.2 C)   Resp 16   SpO2 100%  Gen:   Awake, no distress   Resp:  Normal effort  MSK:   Moves extremities without difficulty  Other:    Medical Decision Making  Medically screening exam initiated at 4:50 PM.  Appropriate orders placed.  Eddie Mendoza was informed that the remainder of the evaluation will be completed by another provider, this initial triage assessment does not replace that evaluation, and the importance of remaining in the ED until their evaluation is complete.     Azucena Cecil, PA-C 05/11/22 1651

## 2022-05-11 NOTE — ED Provider Notes (Signed)
[facility] Provider Note   CSN: [id number] Arrival date & time: 05/11/22  1613     History  Chief Complaint  Patient presents with   Shortness of Breath    Eddie Mendoza is a 72 y.o. male.  The history is provided by the patient, the spouse and medical records. No language interpreter was used.  Shortness of Breath    Patient is a 72 yr old male presenting to the ED with jaundice. 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 Patient went to [place] today for an evaluation but was then advised to come to the ED. Patient has experienced weight loss, increase appetite, yellowing of the eyes, and skin tone since CT scan. Patient also states having trouble sleeping due to the generalized itching he's experiencing. Patient reports feeling short of breath for the last few days. Patient has a history of chronic alcohol use. Wife states he has not stopped completely. Patient denies chest pain, nausea, or vomiting. On exam, yellowing of the eyes, mouth, and skin is present.   Home Medications Prior to Admission medications   Medication Sig Start Date End Date Taking? Authorizing Provider  chlorthalidone (HYGROTON) 25 MG tablet Take 1 tablet by mouth daily. 07/29/20   [provider]  finasteride (PROSCAR) 5 MG tablet Take 1 tablet (5 mg total) by mouth daily. 07/02/20   Patrecia Pour, MD  lidocaine (XYLOCAINE) 5 % ointment Apply 1 application topically 4 (four) times daily as needed for mild pain or moderate pain. 04/15/20   [provider]  losartan (COZAAR) 100 MG tablet Take 50 mg by mouth daily. 07/29/20   [provider]  ondansetron (ZOFRAN) 4 MG tablet Take 1 tablet (4 mg total) by mouth every 6 (six) hours. 03/21/22   Sherrye Payor A, PA-C  oxyCODONE (ROXICODONE) 5 MG immediate release tablet Take 1 tablet (5 mg total)  by mouth every 6 (six) hours as needed for up to 12 doses for severe pain. 03/21/22   Sherrye Payor A, PA-C  polyethylene glycol (MIRALAX / GLYCOLAX) 17 g packet Take 17 g by mouth daily as needed for mild constipation. 06/22/20   Pokhrel, Corrie Mckusick, MD  tamsulosin (FLOMAX) 0.4 MG CAPS capsule Take 1 capsule (0.4 mg total) by mouth daily. 07/01/20   Patrecia Pour, MD      Allergies    Patient has no known allergies.    Review of Systems   Review of Systems  Respiratory:  Positive for shortness of breath.   All other systems reviewed and are negative.   Physical Exam Updated Vital Signs BP 135/63   Pulse 74   Temp 98.8 F (37.1 C) (Oral)   Resp 18   SpO2 99%  Physical Exam Vitals and nursing note reviewed.  Constitutional:      General: He is not in acute distress.    Appearance: He is well-developed.     Interventions: He is not intubated.    Comments: Elderly male appears to be in no acute discomfort.  He is jaundiced.  HENT:     Head: Atraumatic.  Eyes:     Conjunctiva/sclera: Conjunctivae normal.     Comments: Scleral icterus  Cardiovascular:     Rate and Rhythm: Normal rate and regular rhythm.  Pulmonary:     Effort: Pulmonary effort is normal. No accessory muscle usage or  respiratory distress. He is not intubated.     Breath sounds: Normal breath sounds. No decreased breath sounds, wheezing, rhonchi or rales.  Chest:     Chest wall: No tenderness.  Abdominal:     Tenderness: There is no abdominal tenderness.  Musculoskeletal:     Cervical back: Neck supple.     Right lower leg: Edema present.     Left lower leg: Edema present.  Skin:    Findings: No rash.     Comments: Jaundice  Excoriation marks noted throughout skin  Neurological:     Mental Status: He is alert.     ED Results / Procedures / Treatments   Labs (all labs ordered are listed, but only abnormal results are displayed) Labs Reviewed  CBC WITH DIFFERENTIAL/PLATELET - Abnormal; Notable for the  following components:      Result Value   WBC 12.5 (*)    RBC 2.63 (*)    Hemoglobin 8.2 (*)    HCT 23.1 (*)    RDW 23.2 (*)    Platelets 479 (*)    Neutro Abs 10.1 (*)    Lymphs Abs 0.4 (*)    Monocytes Absolute 1.3 (*)    Abs Immature Granulocytes 0.39 (*)    All other components within normal limits  COMPREHENSIVE METABOLIC PANEL - Abnormal; Notable for the following components:   Sodium 133 (*)    CO2 17 (*)    Glucose, Bld 122 (*)    Calcium 8.0 (*)    Total Protein 6.0 (*)    Albumin 1.8 (*)    AST 50 (*)    ALT 59 (*)    Alkaline Phosphatase 380 (*)    Total Bilirubin 18.4 (*)    All other components within normal limits  LIPASE, BLOOD  URINALYSIS, ROUTINE W REFLEX MICROSCOPIC    EKG None  Radiology No results found.  Procedures .Critical Care  Performed by: Domenic Moras, PA-C Authorized by: Domenic Moras, PA-C   Critical care provider statement:    Critical care time (minutes):  30   Critical care was time spent personally by me on the following activities:  Development of treatment plan with patient or surrogate, discussions with consultants, evaluation of patient's response to treatment, examination of patient, ordering and review of laboratory studies, ordering and review of radiographic studies, ordering and performing treatments and interventions, pulse oximetry, re-evaluation of patient's condition and review of old charts     Medications Ordered in ED Medications  hydrOXYzine (ATARAX) tablet 25 mg (25 mg Oral Given 05/11/22 2246)  sodium chloride 0.9 % bolus 1,000 mL (1,000 mLs Intravenous New Bag/Given 05/11/22 2246)  iohexol (OMNIPAQUE) 350 MG/ML injection 75 mL (75 mLs Intravenous Contrast Given 05/11/22 2340)    ED Course/ Medical Decision Making/ A&P                             Medical Decision Making Amount and/or Complexity of Data Reviewed Radiology: ordered.  Risk Prescription drug management. Decision regarding hospitalization.   BP  135/63   Pulse 74   Temp 98.8 F (37.1 C) (Oral)   Resp 18   SpO2 99%   10:14 PM This is a 72 year old male significant history of hypertension, alcohol use, presenting with complaints of skin changes.  For the past 2 months patient endorsed having increase in appetite, weight loss, increased shortness of breath, itchiness throughout skin's, and skin changes.  He is having difficulty  sleeping.  He noticed that his urine is darker than usual and his stools are lighter in color.  Patient also reports seeing blood in his urine on occasion.  History was also given by wife who is at bedside.  Patient was seen in the ED in January for his symptoms.  At that time he had a CT scan performed and he was told that he have cancer.  He however left AMA.  Patient states he went to the [place] recently and he was told that he did not have any cancer.  However, no additional evaluation was done at that time. Patient did quit drinking alcohol within the past month.  He smokes on occasion.  Denies having nausea vomiting diarrhea.  He did notice swelling in his legs which is new.  On exam this is an elderly male appears to be in no acute discomfort.  He has significant scleral icterus, and jaundice.  He has no nuchal rigidity, heart with normal rate and rhythm, lungs are clear to auscultation abdomen is soft and nontender.  Does have bilateral 2+ pitting edema noted to lower extremities extending towards the knee without any erythema or warmth.  Ambulate without difficulty.  Vital sign review and overall reassuring he is afebrile, normal blood pressure, no hypoxia.  EMR review including prior CT scan of abdomen pelvis that was performed on 03/21/2022.  Impression is remarkable for a 10.6 urinary bladder mass highly suspicious for primary bladder neoplasm.  Increased abnormal thickening of the neck of the gallbladder extending towards the hepatic hilum with new urinary ductal dilatation concerning for gallbladder and biliary  neoplasm.  Enlarged retroperitoneal porta hepatis with mesenteric lymph nodes concerning for metastatic disease.  Cystic lesion noted in the pancreatic head.  Heterogeneous enhancement is of an enlarged prostate gland.  These findings are concerning for metastatic disease likely contributing to patient's jaundice.  Labs obtained independent viewed and interpreted by me.  White count of 12.5 which is nonspecific with patient without any infectious symptoms.  Hemoglobin is 8.2, it was 9.6 approximately 4 weeks ago.  Evidence of transaminitis with AST 50, ALT 59, alk phos 380, and total bili markedly elevated at 18.4.  His total bili was 7 approximately 4 weeks ago.  I discussed this finding with on-call neurologist Dr. Felipa Eth who felt patient will need further evaluation and likely biopsy of the mass to determine origin of his cancer.  Patient will also benefit from GI involvement.  I have sent secure message to [facility] as patient have not established any GI follow-up from prior visits.   -Imaging independently viewed and interpreted by me and I agree with radiologist's interpretation.  Result remarkable for abd/pelvis CT with result pending.  Oncoming provider will f/u on result -This patient presents to the ED for concern of jaundice, this involves an extensive number of treatment options, and is a complaint that carries with it a high risk of complications and morbidity.  The differential diagnosis includes liver failure, obstructive pathology, malignancy -Co morbidities that complicate the patient evaluation includes HTN, DM, alcohol use -Treatment includes IVF -Reevaluation of the patient after these medicines showed that the patient stayed the same -PCP office notes or outside notes reviewed -Discussion with specialist [facility] Dr. Marlowe Sax who request to have the result of CT scan before admission -Escalation to admission/observation considered: patient is agreeable with admission.           Final Clinical Impression(s) / ED Diagnoses Final diagnoses:  None  Rx / DC Orders ED Discharge Orders     None         Domenic Moras, PA-C 05/12/22 0003    Teressa Lower, MD 05/12/22 1315

## 2022-05-12 ENCOUNTER — Encounter (HOSPITAL_COMMUNITY): Payer: Self-pay | Admitting: Internal Medicine

## 2022-05-12 DIAGNOSIS — E872 Acidosis, unspecified: Secondary | ICD-10-CM

## 2022-05-12 DIAGNOSIS — E669 Obesity, unspecified: Secondary | ICD-10-CM | POA: Diagnosis present

## 2022-05-12 DIAGNOSIS — D72828 Other elevated white blood cell count: Secondary | ICD-10-CM | POA: Diagnosis not present

## 2022-05-12 DIAGNOSIS — I10 Essential (primary) hypertension: Secondary | ICD-10-CM

## 2022-05-12 DIAGNOSIS — R17 Unspecified jaundice: Secondary | ICD-10-CM | POA: Diagnosis not present

## 2022-05-12 DIAGNOSIS — R748 Abnormal levels of other serum enzymes: Secondary | ICD-10-CM | POA: Diagnosis not present

## 2022-05-12 DIAGNOSIS — G928 Other toxic encephalopathy: Secondary | ICD-10-CM | POA: Diagnosis not present

## 2022-05-12 DIAGNOSIS — L299 Pruritus, unspecified: Secondary | ICD-10-CM | POA: Diagnosis present

## 2022-05-12 DIAGNOSIS — K831 Obstruction of bile duct: Secondary | ICD-10-CM | POA: Diagnosis present

## 2022-05-12 DIAGNOSIS — F1010 Alcohol abuse, uncomplicated: Secondary | ICD-10-CM | POA: Diagnosis present

## 2022-05-12 DIAGNOSIS — R319 Hematuria, unspecified: Secondary | ICD-10-CM | POA: Diagnosis present

## 2022-05-12 DIAGNOSIS — Z79899 Other long term (current) drug therapy: Secondary | ICD-10-CM | POA: Diagnosis not present

## 2022-05-12 DIAGNOSIS — Z8551 Personal history of malignant neoplasm of bladder: Secondary | ICD-10-CM | POA: Diagnosis not present

## 2022-05-12 DIAGNOSIS — N3289 Other specified disorders of bladder: Secondary | ICD-10-CM | POA: Diagnosis not present

## 2022-05-12 DIAGNOSIS — D649 Anemia, unspecified: Secondary | ICD-10-CM | POA: Diagnosis not present

## 2022-05-12 DIAGNOSIS — K449 Diaphragmatic hernia without obstruction or gangrene: Secondary | ICD-10-CM | POA: Diagnosis present

## 2022-05-12 DIAGNOSIS — E871 Hypo-osmolality and hyponatremia: Secondary | ICD-10-CM

## 2022-05-12 DIAGNOSIS — K8309 Other cholangitis: Secondary | ICD-10-CM | POA: Diagnosis present

## 2022-05-12 DIAGNOSIS — F17210 Nicotine dependence, cigarettes, uncomplicated: Secondary | ICD-10-CM | POA: Diagnosis present

## 2022-05-12 DIAGNOSIS — F1090 Alcohol use, unspecified, uncomplicated: Secondary | ICD-10-CM

## 2022-05-12 DIAGNOSIS — K859 Acute pancreatitis without necrosis or infection, unspecified: Secondary | ICD-10-CM | POA: Diagnosis present

## 2022-05-12 DIAGNOSIS — N400 Enlarged prostate without lower urinary tract symptoms: Secondary | ICD-10-CM | POA: Diagnosis present

## 2022-05-12 DIAGNOSIS — R935 Abnormal findings on diagnostic imaging of other abdominal regions, including retroperitoneum: Secondary | ICD-10-CM

## 2022-05-12 DIAGNOSIS — K869 Disease of pancreas, unspecified: Secondary | ICD-10-CM | POA: Diagnosis not present

## 2022-05-12 DIAGNOSIS — C801 Malignant (primary) neoplasm, unspecified: Secondary | ICD-10-CM | POA: Diagnosis not present

## 2022-05-12 DIAGNOSIS — R634 Abnormal weight loss: Secondary | ICD-10-CM | POA: Diagnosis present

## 2022-05-12 DIAGNOSIS — D62 Acute posthemorrhagic anemia: Secondary | ICD-10-CM

## 2022-05-12 DIAGNOSIS — C7889 Secondary malignant neoplasm of other digestive organs: Secondary | ICD-10-CM | POA: Diagnosis present

## 2022-05-12 DIAGNOSIS — K862 Cyst of pancreas: Secondary | ICD-10-CM | POA: Diagnosis present

## 2022-05-12 DIAGNOSIS — Z6831 Body mass index (BMI) 31.0-31.9, adult: Secondary | ICD-10-CM | POA: Diagnosis not present

## 2022-05-12 DIAGNOSIS — K2970 Gastritis, unspecified, without bleeding: Secondary | ICD-10-CM | POA: Diagnosis not present

## 2022-05-12 DIAGNOSIS — E119 Type 2 diabetes mellitus without complications: Secondary | ICD-10-CM | POA: Diagnosis present

## 2022-05-12 DIAGNOSIS — Y92239 Unspecified place in hospital as the place of occurrence of the external cause: Secondary | ICD-10-CM | POA: Diagnosis not present

## 2022-05-12 DIAGNOSIS — R180 Malignant ascites: Secondary | ICD-10-CM | POA: Diagnosis present

## 2022-05-12 DIAGNOSIS — R7401 Elevation of levels of liver transaminase levels: Secondary | ICD-10-CM | POA: Diagnosis not present

## 2022-05-12 DIAGNOSIS — E876 Hypokalemia: Secondary | ICD-10-CM | POA: Diagnosis present

## 2022-05-12 DIAGNOSIS — R609 Edema, unspecified: Secondary | ICD-10-CM | POA: Diagnosis not present

## 2022-05-12 DIAGNOSIS — K8689 Other specified diseases of pancreas: Secondary | ICD-10-CM | POA: Insufficient documentation

## 2022-05-12 LAB — PROTIME-INR
INR: 1 (ref 0.8–1.2)
INR: 1 (ref 0.8–1.2)
Prothrombin Time: 13.3 seconds (ref 11.4–15.2)
Prothrombin Time: 13.5 seconds (ref 11.4–15.2)

## 2022-05-12 LAB — COMPREHENSIVE METABOLIC PANEL
ALT: 61 U/L — ABNORMAL HIGH (ref 0–44)
AST: 55 U/L — ABNORMAL HIGH (ref 15–41)
Albumin: 1.7 g/dL — ABNORMAL LOW (ref 3.5–5.0)
Alkaline Phosphatase: 374 U/L — ABNORMAL HIGH (ref 38–126)
Anion gap: 8 (ref 5–15)
BUN: 18 mg/dL (ref 8–23)
CO2: 18 mmol/L — ABNORMAL LOW (ref 22–32)
Calcium: 8.2 mg/dL — ABNORMAL LOW (ref 8.9–10.3)
Chloride: 109 mmol/L (ref 98–111)
Creatinine, Ser: 0.9 mg/dL (ref 0.61–1.24)
GFR, Estimated: >60 mL/min (ref >60)
Glucose, Bld: 97 mg/dL (ref 70–99)
Potassium: 3.6 mmol/L (ref 3.5–5.1)
Sodium: 135 mmol/L (ref 135–145)
Total Bilirubin: 18.5 mg/dL (ref 0.3–1.2)
Total Protein: 6 g/dL — ABNORMAL LOW (ref 6.5–8.1)

## 2022-05-12 LAB — LIPASE, BLOOD: Lipase: 33 U/L (ref 11–51)

## 2022-05-12 LAB — CBC
HCT: 21.9 % — ABNORMAL LOW (ref 39.0–52.0)
Hemoglobin: 8.1 g/dL — ABNORMAL LOW (ref 13.0–17.0)
MCH: 31.9 pg (ref 26.0–34.0)
MCHC: 37 g/dL — ABNORMAL HIGH (ref 30.0–36.0)
MCV: 86.2 fL (ref 80.0–100.0)
Platelets: 446 10*3/uL — ABNORMAL HIGH (ref 150–400)
RBC: 2.54 MIL/uL — ABNORMAL LOW (ref 4.22–5.81)
RDW: 23.9 % — ABNORMAL HIGH (ref 11.5–15.5)
WBC: 12.1 10*3/uL — ABNORMAL HIGH (ref 4.0–10.5)
nRBC: 0 % (ref 0.0–0.2)

## 2022-05-12 LAB — TYPE AND SCREEN
ABO/RH(D): A POS
Antibody Screen: NEGATIVE

## 2022-05-12 MED ORDER — SODIUM CHLORIDE 0.9 % IV SOLN: INTRAVENOUS | Status: AC

## 2022-05-12 MED ORDER — ADULT MULTIVITAMIN W/MINERALS CH
1.0000 | ORAL_TABLET | Freq: Every day | ORAL | Status: DC
  Administered 2022-05-13 – 2022-05-16 (×4): 1 via ORAL
  Filled 2022-05-12 (×4): qty 1

## 2022-05-12 MED ORDER — LORAZEPAM 1 MG PO TABS: 1.0000 mg | ORAL_TABLET | ORAL | Status: DC | PRN

## 2022-05-12 MED ORDER — THIAMINE MONONITRATE 100 MG PO TABS
100.0000 mg | ORAL_TABLET | Freq: Every day | ORAL | Status: DC
  Administered 2022-05-13 – 2022-05-17 (×5): 100 mg via ORAL
  Filled 2022-05-12 (×5): qty 1

## 2022-05-12 MED ORDER — FOLIC ACID 1 MG PO TABS
1.0000 mg | ORAL_TABLET | Freq: Every day | ORAL | Status: DC
  Administered 2022-05-13 – 2022-05-17 (×5): 1 mg via ORAL
  Filled 2022-05-12 (×5): qty 1

## 2022-05-12 MED ORDER — LORAZEPAM 2 MG/ML IJ SOLN
1.0000 mg | INTRAMUSCULAR | Status: DC | PRN
  Administered 2022-05-12 – 2022-05-14 (×6): 2 mg via INTRAVENOUS
  Filled 2022-05-12 (×6): qty 1

## 2022-05-12 MED ORDER — THIAMINE HCL 100 MG/ML IJ SOLN
100.0000 mg | Freq: Every day | INTRAMUSCULAR | Status: DC
  Administered 2022-05-12: 100 mg via INTRAVENOUS
  Filled 2022-05-12 (×2): qty 2

## 2022-05-12 NOTE — TOC CM/SW Note (Signed)
PCP Dr Rondell Reams at [place] is [facility]

## 2022-05-12 NOTE — Progress Notes (Signed)
PROGRESS NOTE  Eddie Mendoza [id number] DOB: 12/30/1950   PCP: [facility]  Patient is from: Home  DOA: 05/11/2022 LOS: 0  Chief complaints Chief Complaint  Patient presents with   Shortness of Breath     Brief Narrative / Interim history: 72 year old M with PMH of BPH, TURBT in 2022, hematuria, anemia, HTN, EtOH and substance use and recent ED visit on 1/1 when he left AMA returning with jaundice, unintentional 20 pound weight loss and itching, and admitted for obstructive jaundice/hyperbilirubinemia, pancreatic cystic mass and bladder/prostate mass.  Seen in the ED on 03/21/2022 for abdominal pain and hematuria.  CT was concerning for bladder neoplasm, gallbladder/biliary neoplasm, cystic lesions in the pancreatic head, and metastatic disease.  Patient refused hospitalization and left AMA at that time.   In ED, stable vitals.  Hgb 8.2 (9.6 on 1/24).  BUN 18.  AST 50.  ALT 59.  Total bili 18.4.  Lipase and INR normal.  CT with contrast concerning for questionable acute pancreatitis, 2.9 cm proximal pancreatic mass with interval worsening of severe intra and extrahepatic biliary ductal dilation concerning for hepatobiliary or pancreatic metastatic malignancy, bladder/prostate mass, marked prostamegaly and heterogenicity of prostate gland, retroperitoneal and post Cabbell LAD and questionable diffuse colonic bowel thickening with no definite finding of colitis. ED PA discussed the case with Dr. Felipa Eth with urology who felt that the patient is currently not a surgical candidate due to his other medical issues.  He recommended outpatient follow-up with urology for biopsy of the bladder mass.  [facility] consulted.  Hospitalist service called for admission.  Subjective: Seen and examined earlier this morning.  No major events overnight of this morning.  Has no complaints.  He denies pain, shortness of breath, nausea or vomiting.  Objective: Vitals:   05/12/22 0200 05/12/22  0300 05/12/22 0358 05/12/22 0749  BP: 133/64 (!) 157/108 (!) 157/73 120/65  Pulse:  78 69 63  Resp:  18  16  Temp:   98.6 F (37 C) 98.8 F (37.1 C)  TempSrc:   Oral Oral  SpO2:  98% 99% 100%    Examination:  GENERAL: No apparent distress.  Nontoxic. HEENT: MMM.  Vision and hearing grossly intact.  NECK: Supple.  No apparent JVD.  RESP:  No IWOB.  Fair aeration bilaterally. CVS:  RRR. Heart sounds normal.  ABD/GI/GU: BS+.  Abdomen distended.  Nontender. MSK/EXT:  Moves extremities. No apparent deformity.  1+ BLE edema. SKIN: no apparent skin lesion or wound NEURO: Awake, alert and oriented appropriately.  No apparent focal neuro deficit. PSYCH: Calm. Normal affect.   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 -GI on board.  Plan for EUS/ERCP on 2/23. -Regular diet today and n.p.o. after midnight -Continue monitoring LFT/bilirubin.  Lipase within normal.   Hematuria/possible bladder/prostate mass concerning for malignancy: -ED PA discussed the case with Dr. Felipa Eth with urology who felt that the patient is currently not a surgical candidate due to his other medical issues.  He recommended outpatient follow-up with urology for biopsy -Resume home Flomax and Proscar.  Acute on chronic blood loss anemia: Patient denies hematuria on my interview.  UA on 1/24 without hematuria. Recent Labs    03/21/22 1311 04/13/22 1410 05/11/22 1655 05/12/22 0628  HGB 10.1* 9.6* 8.2* 8.1*   -Continue monitoring -Check anemia panel in the morning  Mild hyponatremia: Could be due to diuretics. -Monitor.   Normal anion gap metabolic acidosis -Discontinue IV fluid.   Alcohol use disorder: Patient denies any alcohol use for several weeks.  However, per ED documentation, his wife reported ongoing alcohol use. -Continue CIWA -Encourage alcohol cessation.   Essential hypertension: Normotensive off home antihypertensive meds. -Continue holding losartan and thiazide diuretics  There is no height or weight on file to calculate BMI.           DVT prophylaxis:  SCDs Start: 05/12/22 0204  Code Status: Full code Family Communication: None at bedside Level of care: Telemetry Medical Status is: Observation The patient will require care spanning > 2 midnights and should be moved to inpatient because: Obstructive jaundice, elevated LFT, pancreatic mass   Final disposition: Likely home once medically cleared Consultants:  Gastroenterology Urology  55 minutes with more than 50% spent in reviewing records, counseling patient/family and coordinating care.   Sch Meds:  Scheduled Meds:  folic acid  1 mg Oral Daily   multivitamin with minerals  1 tablet Oral Daily   thiamine  100 mg Oral Daily   Or   thiamine  100 mg Intravenous Daily   Continuous Infusions:  sodium chloride 125 mL/hr at 05/12/22 0630   PRN Meds:.LORazepam **OR** LORazepam  Antimicrobials: Anti-infectives (From admission, onward)    None        I have personally reviewed the following labs and images: CBC: Recent Labs  Lab 05/11/22 1655 05/12/22 0628  WBC 12.5* 12.1*  NEUTROABS 10.1*  --   HGB 8.2* 8.1*  HCT 23.1* 21.9*  MCV 87.8 86.2  PLT 479* 446*   BMP &GFR Recent Labs  Lab 05/11/22 1655 05/12/22 0628  NA 133* 135  K 3.5 3.6  CL 107 109  CO2 17* 18*  GLUCOSE 122* 97  BUN 15 18  CREATININE 0.87 0.90  CALCIUM 8.0* 8.2*   CrCl cannot be calculated (Unknown ideal  weight.). Liver & Pancreas: Recent Labs  Lab 05/11/22 1655 05/12/22 0628  AST 50* 55*  ALT 59* 61*  ALKPHOS 380* 374*  BILITOT 18.4* 18.5*  PROT 6.0* 6.0*  ALBUMIN 1.8* 1.7*   Recent Labs  Lab 05/11/22 1655 05/12/22 0628  LIPASE 41 33   No results for input(s): "AMMONIA" in the last 168 hours. Diabetic: No results for input(s): "HGBA1C" in the last 72 hours. No results for input(s): "GLUCAP" in the last 168 hours. Cardiac Enzymes: No results for input(s): "CKTOTAL", "CKMB", "CKMBINDEX", "TROPONINI" in the last 168 hours. No results for input(s): "PROBNP" in the last 8760 hours. Coagulation Profile: Recent Labs  Lab 05/12/22 0015 05/12/22 0628  INR 1.0 1.0   Thyroid Function Tests: No results for input(s): "TSH", "T4TOTAL", "FREET4", "T3FREE", "THYROIDAB" in the last 72 hours. Lipid Profile: No results for input(s): "CHOL", "HDL", "LDLCALC", "TRIG", "CHOLHDL", "LDLDIRECT" in the last 72 hours. Anemia Panel: No results for input(s): "VITAMINB12", "FOLATE", "FERRITIN", "TIBC", "IRON", "RETICCTPCT" in the last 72 hours. Urine analysis:    Component Value Date/Time   COLORURINE YELLOW 04/13/2022 [place] 04/13/2022 1353   LABSPEC 1.017 04/13/2022 1353   PHURINE 5.0 04/13/2022 1353   GLUCOSEU NEGATIVE 04/13/2022 1353   HGBUR  NEGATIVE 04/13/2022 1353   BILIRUBINUR SMALL (A) 04/13/2022 1353   KETONESUR NEGATIVE 04/13/2022 1353   PROTEINUR 30 (A) 04/13/2022 1353   UROBILINOGEN 0.2 08/15/2009 1010   NITRITE NEGATIVE 04/13/2022 1353   LEUKOCYTESUR TRACE (A) 04/13/2022 1353   Sepsis Labs: Invalid input(s): "PROCALCITONIN", "LACTICIDVEN"  Microbiology: No results found for this or any previous visit (from the past 240 hour(s)).  Radiology Studies: CT ABDOMEN PELVIS W CONTRAST  Result Date: 05/12/2022 CLINICAL DATA:  Biliary obstruction suspected (Ped 0-17y) EXAM: CT ABDOMEN AND PELVIS WITH CONTRAST TECHNIQUE: Multidetector CT imaging of the abdomen  and pelvis was performed using the standard protocol following bolus administration of intravenous contrast. RADIATION DOSE REDUCTION: This exam was performed according to the departmental dose-optimization program which includes automated exposure control, adjustment of the mA and/or kV according to patient size and/or use of iterative reconstruction technique. CONTRAST:  41m OMNIPAQUE IOHEXOL 350 MG/ML SOLN COMPARISON:  CT abdomen pelvis 03/21/2022 FINDINGS: Lower chest: No acute abnormality. Hepatobiliary: No focal liver abnormality. Poorly visualized gallbladder with persistent gallbladder wall thickening around the neck (3:24). Interval worsening of severe intra and extrahepatic biliary ductal dilatation. Pancreas: Persistent stable 2.9 x 2.4 cm proximal pancreatic duct hypodense lesion. Slightly hazy pancreatic contour proximally with trace fat stranding. No main pancreatic ductal dilatation. Spleen: Normal in size without focal abnormality. Adrenals/Urinary Tract: No adrenal nodule bilaterally. Bilateral kidneys enhance symmetrically. No hydronephrosis. No hydroureter. Redemonstration of a heterogeneous masslike intraluminal lesion within the urinary bladder. Persistent urine bladder wall thickening. On delayed imaging, there is no urothelial wall thickening and there are no filling defects in the opacified portions of the bilateral collecting systems or ureters. Stomach/Bowel: Stomach is within normal limits. No evidence of small bowel wall thickening or dilatation. No large bowel dilatation. Diffuse large bowel wall thickening. No pericolonic fat stranding. Scattered colonic diverticula. Appendix appears normal. Vascular/Lymphatic: No abdominal aorta or iliac aneurysm. Mild atherosclerotic plaque of the aorta and its branches. Persistent retroperitoneal lymphadenopathy as an example a 1.8 cm left periaortic lymph node (3:34). Difficult to delineate suggestion of portacaval lymphadenopathy (3:18).  Reproductive: The prostate is enlarged and heterogeneous measuring up to 7.3 cm. Other: Interval increase in trace simple fluid ascites. No intraperitoneal free gas. No organized fluid collection. Musculoskeletal: No abdominal wall hernia or abnormality. No suspicious lytic or blastic osseous lesions. No acute displaced fracture. Multilevel degenerative changes of the spine. IMPRESSION: 1. Question development of acute pancreatitis. Correlate with lipase levels. 2. Proximal pancreatic 2.9 cm cystic mass with interval worsening of severe intra and extrahepatic biliary ductal dilatation. Persistent poorly visualized gallbladder with gallbladder neck thickening. 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 When the patient is clinically stable and able to follow directions and hold their breath (preferably as an outpatient) further evaluation with dedicated MRI with and without contrast should be considered. 3. Persistent heterogeneous masslike intraluminal lesion within the urinary bladder. Finding could represent a urinary bladder or prostatic mass versus blood products. Recommend urologic consultation. 4. Marked Prostatomegaly and heterogeneity of the prostate gland. 5. Retroperitoneal and likely portacaval lymphadenopathy. 6. Colonic diverticulosis with no acute diverticulitis. 7. Question diffuse colonic bowel thickening with no definite findings of colitis. 8. Trace simple rib fluid ascites. Electronically Signed   By: MIven FinnM.D.   On: 05/12/2022 00:14      Keeva Reisen T. GMidland If 7PM-7AM, please contact night-coverage [email or web address] 05/12/2022, 12:46 PM

## 2022-05-12 NOTE — H&P (View-Only) (Signed)
.    Consultation  Referring Provider:    Dr. Cyndia Skeeters Primary Care Physician:  [facility] Primary Gastroenterologist: Althia Forts       Reason for Consultation:     Elevated LFTs, metastatic bladder cancer         HPI:   Eddie Mendoza is a 72 y.o. male with a past medical history as listed below including Tiernan's urethral resection of bladder tumor in 2022, hematuria, anemia, BPH, substance abuse, hypertension and others, who presented to the ED on 05/11/2022 for jaundice.    Recent ED visit on 03/21/2022 for abdominal pain and hematuria.  CTAP at that time was concerning for bladder neoplasm, gallbladder/biliary neoplasm, cystic lesions in the pancreatic head and metastatic disease.  Hospital admission was recommended but patient left AMA.    At time of admission patient complained of jaundice, itching and weight loss.  Described a history of chronic alcohol use.  Described weight loss of 20 pounds over the past few weeks but has not lost his appetite.  Intermittent Hematuria.    Today, the patient is a poor historian.  His biggest complaint is that his eyes are yellow and he has a lot of itching and tingling all over his skin.  Does tell me that he drinks alcohol almost daily.  Confirms that he has lost maybe about 20 pounds over the past 3 weeks and continues with intermittent hematuria.  Explains that he would like to do anything he can to make his life better.    Denies fever, chills, nausea, vomiting or abdominal pain.  ED course: WBC 12.5, hemoglobin 8.2, MCV 87.8, platelet count 479 K, sodium 133, anion gap 9, glucose 122, AST 50, ALT 59 alk phos 380 with a T. bili of 18.4, lipase and INR normal; CTAP with contrast showed question development of acute pancreatitis, proximal pancreatic 2.9 cm cystic mass with interval worsening with severe intra and extrahepatic biliary ductal dilation, persistent poorly visualized gallbladder with gallbladder neck thickening-finding suggestive of  hepatobiliary or pancreatic metastatic malignancy, persistent heterogenous masslike intraluminal lesion within the urinary bladder, marked prostatic megaly and heterogenicity of the prostate gland, retroperitoneal and likely portacaval lymphadenopathy, colonic diverticulosis, question diffuse colonic bowel thickening with no definite finding of colitis and trace ascites  Past Medical History:  Diagnosis Date   Abnormal CT scan, gallbladder 08/13/2020   Hypertension     Past Surgical History:  Procedure Laterality Date   TRANSURETHRAL RESECTION OF BLADDER TUMOR N/A 06/30/2020   Procedure:  Almyra Free OF BLEEDING, CLOT EVACUATION;  Surgeon: Ardis Hughs, MD;  Location: [facility];  Service: Urology;  Laterality: N/A;   TRANSURETHRAL RESECTION OF BLADDER TUMOR N/A 08/13/2020   Procedure: CYSTOSCOPY, CLOT EVACUATION, FULGERATION;  Surgeon: Ardis Hughs, MD;  Location: [facility];  Service: Urology;  Laterality: N/A;    History reviewed. No pertinent family history.  Social History   Tobacco Use   Smoking status: Every Day    Packs/day: 0.50    Years: 30.00    Total pack years: 15.00    Types: Cigarettes   Smokeless tobacco: Never  Vaping Use   Vaping Use: Never used  Substance Use Topics   Alcohol use: Yes    Alcohol/week: 3.0 standard drinks of alcohol    Types: 3 Shots of liquor per week   Drug use: No    Prior to Admission medications   Medication Sig Start Date End Date Taking? Authorizing Provider  chlorthalidone (HYGROTON) 25 MG tablet Take 1  tablet by mouth daily. 07/29/20   [provider]  finasteride (PROSCAR) 5 MG tablet Take 1 tablet (5 mg total) by mouth daily. 07/02/20   Patrecia Pour, MD  lidocaine (XYLOCAINE) 5 % ointment Apply 1 application topically 4 (four) times daily as needed for mild pain or moderate pain. 04/15/20   [provider]  losartan (COZAAR) 100 MG tablet Take 50 mg by mouth daily. 07/29/20   [provider]   ondansetron (ZOFRAN) 4 MG tablet Take 1 tablet (4 mg total) by mouth every 6 (six) hours. 03/21/22   Sherrye Payor A, PA-C  oxyCODONE (ROXICODONE) 5 MG immediate release tablet Take 1 tablet (5 mg total) by mouth every 6 (six) hours as needed for up to 12 doses for severe pain. 03/21/22   Sherrye Payor A, PA-C  polyethylene glycol (MIRALAX / GLYCOLAX) 17 g packet Take 17 g by mouth daily as needed for mild constipation. 06/22/20   Pokhrel, Corrie Mckusick, MD  tamsulosin (FLOMAX) 0.4 MG CAPS capsule Take 1 capsule (0.4 mg total) by mouth daily. 07/01/20   Patrecia Pour, MD    Current Facility-Administered Medications  Medication Dose Route Frequency Provider Last Rate Last Admin   0.9 %  sodium chloride infusion   Intravenous Continuous Shela Leff, MD 125 mL/hr at 05/12/22 0630 New Bag at [id number]   folic acid (FOLVITE) tablet 1 mg  1 mg Oral Daily Shela Leff, MD       LORazepam (ATIVAN) tablet 1-4 mg  1-4 mg Oral Q1H PRN Shela Leff, MD       Or   LORazepam (ATIVAN) injection 1-4 mg  1-4 mg Intravenous Q1H PRN Shela Leff, MD       multivitamin with minerals tablet 1 tablet  1 tablet Oral Daily Shela Leff, MD       thiamine (VITAMIN B1) tablet 100 mg  100 mg Oral Daily Shela Leff, MD       Or   thiamine (VITAMIN B1) injection 100 mg  100 mg Intravenous Daily Shela Leff, MD        Allergies as of 05/11/2022   (No Known Allergies)     Review of Systems:    Constitutional: No fever or chills Skin: See HPI Cardiovascular: No chest pain Respiratory: No SOB Gastrointestinal: See HPI and otherwise negative Genitourinary: No dysuria  Neurological: No headache, dizziness or syncope Musculoskeletal: No new muscle or joint pain Hematologic: No bleeding  Psychiatric: No history of depression or anxiety    Physical Exam:  Vital signs in last 24 hours: Temp:  [98.6 F (37 C)-98.9 F (37.2 C)] 98.8 F (37.1 C) (02/22 0749) Pulse Rate:   [63-78] 63 (02/22 0749) Resp:  [16-18] 16 (02/22 0749) BP: (120-157)/(61-108) 120/65 (02/22 0749) SpO2:  [98 %-100 %] 100 % (02/22 0749)   General:   Pleasant Jaundiced AA male appears to be in NAD, Well developed, Well nourished, alert and cooperative Head:  Normocephalic and atraumatic. Eyes:   PEERL, EOMI. No icterus. Conjunctiva pink. Ears:  Normal auditory acuity. Neck:  Supple Throat: Oral cavity and pharynx without inflammation, swelling or lesion. Lungs: Respirations even and unlabored. Lungs clear to auscultation bilaterally.   No wheezes, crackles, or rhonchi.  Heart: Normal S1, S2. No MRG. Regular rate and rhythm. +2+ pitting edema of b/l LE ext Abdomen:  Soft, nondistended, nontender. No rebound or guarding. Normal bowel sounds. No appreciable masses or hepatomegaly. Rectal:  Not performed.  Msk:  Symmetrical without gross deformities. Peripheral  pulses intact.  Extremities:  Without edema, no deformity or joint abnormality.  Neurologic:  Alert and  oriented x4;  grossly normal neurologically.  Skin:   Dry and intact without significant lesions or rashes. Psychiatric: Demonstrates good judgement and reason without abnormal affect or behaviors.   LAB RESULTS: Recent Labs    05/11/22 1655 05/12/22 0628  WBC 12.5* 12.1*  HGB 8.2* 8.1*  HCT 23.1* 21.9*  PLT 479* 446*   BMET Recent Labs    05/11/22 1655 05/12/22 0628  NA 133* 135  K 3.5 3.6  CL 107 109  CO2 17* 18*  GLUCOSE 122* 97  BUN 15 18  CREATININE 0.87 0.90  CALCIUM 8.0* 8.2*      Latest Ref Rng & Units 05/12/2022    6:28 AM 05/11/2022    4:55 PM 04/13/2022    2:10 PM  Hepatic Function  Total Protein 6.5 - 8.1 g/dL 6.0  6.0  7.9   Albumin 3.5 - 5.0 g/dL 1.7  1.8  3.1   AST 15 - 41 U/L 55  50  68   ALT 0 - 44 U/L 61  59  85   Alk Phosphatase 38 - 126 U/L 374  380  538   Total Bilirubin 0.3 - 1.2 mg/dL 18.5  18.4  7.0      PT/INR Recent Labs    05/12/22 0015 05/12/22 0628  LABPROT 13.5 13.3   INR 1.0 1.0    STUDIES: CT ABDOMEN PELVIS W CONTRAST  Result Date: 05/12/2022 CLINICAL DATA:  Biliary obstruction suspected (Ped 0-17y) EXAM: CT ABDOMEN AND PELVIS WITH CONTRAST TECHNIQUE: Multidetector CT imaging of the abdomen and pelvis was performed using the standard protocol following bolus administration of intravenous contrast. RADIATION DOSE REDUCTION: This exam was performed according to the departmental dose-optimization program which includes automated exposure control, adjustment of the mA and/or kV according to patient size and/or use of iterative reconstruction technique. CONTRAST:  21m OMNIPAQUE IOHEXOL 350 MG/ML SOLN COMPARISON:  CT abdomen pelvis 03/21/2022 FINDINGS: Lower chest: No acute abnormality. Hepatobiliary: No focal liver abnormality. Poorly visualized gallbladder with persistent gallbladder wall thickening around the neck (3:24). Interval worsening of severe intra and extrahepatic biliary ductal dilatation. Pancreas: Persistent stable 2.9 x 2.4 cm proximal pancreatic duct hypodense lesion. Slightly hazy pancreatic contour proximally with trace fat stranding. No main pancreatic ductal dilatation. Spleen: Normal in size without focal abnormality. Adrenals/Urinary Tract: No adrenal nodule bilaterally. Bilateral kidneys enhance symmetrically. No hydronephrosis. No hydroureter. Redemonstration of a heterogeneous masslike intraluminal lesion within the urinary bladder. Persistent urine bladder wall thickening. On delayed imaging, there is no urothelial wall thickening and there are no filling defects in the opacified portions of the bilateral collecting systems or ureters. Stomach/Bowel: Stomach is within normal limits. No evidence of small bowel wall thickening or dilatation. No large bowel dilatation. Diffuse large bowel wall thickening. No pericolonic fat stranding. Scattered colonic diverticula. Appendix appears normal. Vascular/Lymphatic: No abdominal aorta or iliac aneurysm. Mild  atherosclerotic plaque of the aorta and its branches. Persistent retroperitoneal lymphadenopathy as an example a 1.8 cm left periaortic lymph node (3:34). Difficult to delineate suggestion of portacaval lymphadenopathy (3:18). Reproductive: The prostate is enlarged and heterogeneous measuring up to 7.3 cm. Other: Interval increase in trace simple fluid ascites. No intraperitoneal free gas. No organized fluid collection. Musculoskeletal: No abdominal wall hernia or abnormality. No suspicious lytic or blastic osseous lesions. No acute displaced fracture. Multilevel degenerative changes of the spine. IMPRESSION: 1. Question development of acute pancreatitis.  Correlate with lipase levels. 2. Proximal pancreatic 2.9 cm cystic mass with interval worsening of severe intra and extrahepatic biliary ductal dilatation. Persistent poorly visualized gallbladder with gallbladder neck thickening. 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 When the patient is clinically stable and able to follow directions and hold their breath (preferably as an outpatient) further evaluation with dedicated MRI with and without contrast should be considered. 3. Persistent heterogeneous masslike intraluminal lesion within the urinary bladder. Finding could represent a urinary bladder or prostatic mass versus blood products. Recommend urologic consultation. 4. Marked Prostatomegaly and heterogeneity of the prostate gland. 5. Retroperitoneal and likely portacaval lymphadenopathy. 6. Colonic diverticulosis with no acute diverticulitis. 7. Question diffuse colonic bowel thickening with no definite findings of colitis. 8. Trace simple rib fluid ascites. Electronically Signed   By: Iven Finn M.D.   On: 05/12/2022 00:14      Impression / Plan:   Impression: 1.  Elevated LFTs: CT concerning for hepatobiliary or pancreatic metastatic malignancy, patient presented complaining of jaundice, T.  bili elevated to 18.4-->18.5 overnight; likely obstructive process from mets/cancer 2.  Hematuria: With history of bladder neoplasm, question relation to bladder versus prostate mass, acute on chronic blood loss anemia 3.  Hyponatremia: At time of admission, resolved now 4.  Alcohol use disorder: Denies any alcohol use for several weeks (though wife reported ongoing alcohol use)  Plan: 1.  Discussed case with Dr. Rush Landmark who has agreed to do EUS/ERCP tomorrow scheduled for 1:30 PM.  Did discuss risks, benefits, limitations and alternatives with the patient and he agrees to proceed. 2.  Patient can be on a regular diet today and n.p.o. at midnight 3.  Continue to monitor LFTs and hemoglobin, mostly greater than 7 at time of procedures 4.  Continue other supportive measures  Thank you for your kind consultation, we will continue to follow.  Lavone Nian Rosmary Dionisio  05/12/2022, 9:30 AM

## 2022-05-12 NOTE — ED Provider Notes (Signed)
Received at shift change from bowie tran -PA-C please see note for full detail.  Patient presented with increasing weight loss, pruritus, worsening jaundice, workup revealed significantly elevated total T. bili, previous provider spoke with GI who recommends hospital admission who will be available for consultation in  the morning he also spoke with urology and will be available for consultation if needed.  CT scan was pending at time of consult hospitalist  Per previous provider follow-up on CT scan and speak with hospitalist for admission.   CT scan reveals questionable pancreatitis, pancreatic cysts mass suggestive of hepatic biliary or pancreatic metastatic disease, as well as masslike structure in the bladder.  Spoke with Dr. Levie Heritage of the hospitalist team about these findings and she will admit the patient.    Marcello Fennel, PA-C 05/12/22 0217    Fatima Blank, MD 05/12/22 [phone]

## 2022-05-12 NOTE — Consult Note (Signed)
.    Consultation  Referring Provider:    Dr. Cyndia Skeeters Primary Care Physician:  [facility] Primary Gastroenterologist: Althia Forts       Reason for Consultation:     Elevated LFTs, metastatic bladder cancer         HPI:   Eddie Mendoza is a 72 y.o. male with a past medical history as listed below including Tiernan's urethral resection of bladder tumor in 2022, hematuria, anemia, BPH, substance abuse, hypertension and others, who presented to the ED on 05/11/2022 for jaundice.    Recent ED visit on 03/21/2022 for abdominal pain and hematuria.  CTAP at that time was concerning for bladder neoplasm, gallbladder/biliary neoplasm, cystic lesions in the pancreatic head and metastatic disease.  Hospital admission was recommended but patient left AMA.    At time of admission patient complained of jaundice, itching and weight loss.  Described a history of chronic alcohol use.  Described weight loss of 20 pounds over the past few weeks but has not lost his appetite.  Intermittent Hematuria.    Today, the patient is a poor historian.  His biggest complaint is that his eyes are yellow and he has a lot of itching and tingling all over his skin.  Does tell me that he drinks alcohol almost daily.  Confirms that he has lost maybe about 20 pounds over the past 3 weeks and continues with intermittent hematuria.  Explains that he would like to do anything he can to make his life better.    Denies fever, chills, nausea, vomiting or abdominal pain.  ED course: WBC 12.5, hemoglobin 8.2, MCV 87.8, platelet count 479 K, sodium 133, anion gap 9, glucose 122, AST 50, ALT 59 alk phos 380 with a T. bili of 18.4, lipase and INR normal; CTAP with contrast showed question development of acute pancreatitis, proximal pancreatic 2.9 cm cystic mass with interval worsening with severe intra and extrahepatic biliary ductal dilation, persistent poorly visualized gallbladder with gallbladder neck thickening-finding suggestive of  hepatobiliary or pancreatic metastatic malignancy, persistent heterogenous masslike intraluminal lesion within the urinary bladder, marked prostatic megaly and heterogenicity of the prostate gland, retroperitoneal and likely portacaval lymphadenopathy, colonic diverticulosis, question diffuse colonic bowel thickening with no definite finding of colitis and trace ascites  Past Medical History:  Diagnosis Date   Abnormal CT scan, gallbladder 08/13/2020   Hypertension     Past Surgical History:  Procedure Laterality Date   TRANSURETHRAL RESECTION OF BLADDER TUMOR N/A 06/30/2020   Procedure:  Almyra Free OF BLEEDING, CLOT EVACUATION;  Surgeon: Ardis Hughs, MD;  Location: [facility];  Service: Urology;  Laterality: N/A;   TRANSURETHRAL RESECTION OF BLADDER TUMOR N/A 08/13/2020   Procedure: CYSTOSCOPY, CLOT EVACUATION, FULGERATION;  Surgeon: Ardis Hughs, MD;  Location: [facility];  Service: Urology;  Laterality: N/A;    History reviewed. No pertinent family history.  Social History   Tobacco Use   Smoking status: Every Day    Packs/day: 0.50    Years: 30.00    Total pack years: 15.00    Types: Cigarettes   Smokeless tobacco: Never  Vaping Use   Vaping Use: Never used  Substance Use Topics   Alcohol use: Yes    Alcohol/week: 3.0 standard drinks of alcohol    Types: 3 Shots of liquor per week   Drug use: No    Prior to Admission medications   Medication Sig Start Date End Date Taking? Authorizing Provider  chlorthalidone (HYGROTON) 25 MG tablet Take 1  tablet by mouth daily. 07/29/20   [provider]  finasteride (PROSCAR) 5 MG tablet Take 1 tablet (5 mg total) by mouth daily. 07/02/20   Patrecia Pour, MD  lidocaine (XYLOCAINE) 5 % ointment Apply 1 application topically 4 (four) times daily as needed for mild pain or moderate pain. 04/15/20   [provider]  losartan (COZAAR) 100 MG tablet Take 50 mg by mouth daily. 07/29/20   [provider]   ondansetron (ZOFRAN) 4 MG tablet Take 1 tablet (4 mg total) by mouth every 6 (six) hours. 03/21/22   Sherrye Payor A, PA-C  oxyCODONE (ROXICODONE) 5 MG immediate release tablet Take 1 tablet (5 mg total) by mouth every 6 (six) hours as needed for up to 12 doses for severe pain. 03/21/22   Sherrye Payor A, PA-C  polyethylene glycol (MIRALAX / GLYCOLAX) 17 g packet Take 17 g by mouth daily as needed for mild constipation. 06/22/20   Pokhrel, Corrie Mckusick, MD  tamsulosin (FLOMAX) 0.4 MG CAPS capsule Take 1 capsule (0.4 mg total) by mouth daily. 07/01/20   Patrecia Pour, MD    Current Facility-Administered Medications  Medication Dose Route Frequency Provider Last Rate Last Admin   0.9 %  sodium chloride infusion   Intravenous Continuous Shela Leff, MD 125 mL/hr at 05/12/22 0630 New Bag at [id number]   folic acid (FOLVITE) tablet 1 mg  1 mg Oral Daily Shela Leff, MD       LORazepam (ATIVAN) tablet 1-4 mg  1-4 mg Oral Q1H PRN Shela Leff, MD       Or   LORazepam (ATIVAN) injection 1-4 mg  1-4 mg Intravenous Q1H PRN Shela Leff, MD       multivitamin with minerals tablet 1 tablet  1 tablet Oral Daily Shela Leff, MD       thiamine (VITAMIN B1) tablet 100 mg  100 mg Oral Daily Shela Leff, MD       Or   thiamine (VITAMIN B1) injection 100 mg  100 mg Intravenous Daily Shela Leff, MD        Allergies as of 05/11/2022   (No Known Allergies)     Review of Systems:    Constitutional: No fever or chills Skin: See HPI Cardiovascular: No chest pain Respiratory: No SOB Gastrointestinal: See HPI and otherwise negative Genitourinary: No dysuria  Neurological: No headache, dizziness or syncope Musculoskeletal: No new muscle or joint pain Hematologic: No bleeding  Psychiatric: No history of depression or anxiety    Physical Exam:  Vital signs in last 24 hours: Temp:  [98.6 F (37 C)-98.9 F (37.2 C)] 98.8 F (37.1 C) (02/22 0749) Pulse Rate:   [63-78] 63 (02/22 0749) Resp:  [16-18] 16 (02/22 0749) BP: (120-157)/(61-108) 120/65 (02/22 0749) SpO2:  [98 %-100 %] 100 % (02/22 0749)   General:   Pleasant Jaundiced AA male appears to be in NAD, Well developed, Well nourished, alert and cooperative Head:  Normocephalic and atraumatic. Eyes:   PEERL, EOMI. No icterus. Conjunctiva pink. Ears:  Normal auditory acuity. Neck:  Supple Throat: Oral cavity and pharynx without inflammation, swelling or lesion. Lungs: Respirations even and unlabored. Lungs clear to auscultation bilaterally.   No wheezes, crackles, or rhonchi.  Heart: Normal S1, S2. No MRG. Regular rate and rhythm. +2+ pitting edema of b/l LE ext Abdomen:  Soft, nondistended, nontender. No rebound or guarding. Normal bowel sounds. No appreciable masses or hepatomegaly. Rectal:  Not performed.  Msk:  Symmetrical without gross deformities. Peripheral  pulses intact.  Extremities:  Without edema, no deformity or joint abnormality.  Neurologic:  Alert and  oriented x4;  grossly normal neurologically.  Skin:   Dry and intact without significant lesions or rashes. Psychiatric: Demonstrates good judgement and reason without abnormal affect or behaviors.   LAB RESULTS: Recent Labs    05/11/22 1655 05/12/22 0628  WBC 12.5* 12.1*  HGB 8.2* 8.1*  HCT 23.1* 21.9*  PLT 479* 446*   BMET Recent Labs    05/11/22 1655 05/12/22 0628  NA 133* 135  K 3.5 3.6  CL 107 109  CO2 17* 18*  GLUCOSE 122* 97  BUN 15 18  CREATININE 0.87 0.90  CALCIUM 8.0* 8.2*      Latest Ref Rng & Units 05/12/2022    6:28 AM 05/11/2022    4:55 PM 04/13/2022    2:10 PM  Hepatic Function  Total Protein 6.5 - 8.1 g/dL 6.0  6.0  7.9   Albumin 3.5 - 5.0 g/dL 1.7  1.8  3.1   AST 15 - 41 U/L 55  50  68   ALT 0 - 44 U/L 61  59  85   Alk Phosphatase 38 - 126 U/L 374  380  538   Total Bilirubin 0.3 - 1.2 mg/dL 18.5  18.4  7.0      PT/INR Recent Labs    05/12/22 0015 05/12/22 0628  LABPROT 13.5 13.3   INR 1.0 1.0    STUDIES: CT ABDOMEN PELVIS W CONTRAST  Result Date: 05/12/2022 CLINICAL DATA:  Biliary obstruction suspected (Ped 0-17y) EXAM: CT ABDOMEN AND PELVIS WITH CONTRAST TECHNIQUE: Multidetector CT imaging of the abdomen and pelvis was performed using the standard protocol following bolus administration of intravenous contrast. RADIATION DOSE REDUCTION: This exam was performed according to the departmental dose-optimization program which includes automated exposure control, adjustment of the mA and/or kV according to patient size and/or use of iterative reconstruction technique. CONTRAST:  80m OMNIPAQUE IOHEXOL 350 MG/ML SOLN COMPARISON:  CT abdomen pelvis 03/21/2022 FINDINGS: Lower chest: No acute abnormality. Hepatobiliary: No focal liver abnormality. Poorly visualized gallbladder with persistent gallbladder wall thickening around the neck (3:24). Interval worsening of severe intra and extrahepatic biliary ductal dilatation. Pancreas: Persistent stable 2.9 x 2.4 cm proximal pancreatic duct hypodense lesion. Slightly hazy pancreatic contour proximally with trace fat stranding. No main pancreatic ductal dilatation. Spleen: Normal in size without focal abnormality. Adrenals/Urinary Tract: No adrenal nodule bilaterally. Bilateral kidneys enhance symmetrically. No hydronephrosis. No hydroureter. Redemonstration of a heterogeneous masslike intraluminal lesion within the urinary bladder. Persistent urine bladder wall thickening. On delayed imaging, there is no urothelial wall thickening and there are no filling defects in the opacified portions of the bilateral collecting systems or ureters. Stomach/Bowel: Stomach is within normal limits. No evidence of small bowel wall thickening or dilatation. No large bowel dilatation. Diffuse large bowel wall thickening. No pericolonic fat stranding. Scattered colonic diverticula. Appendix appears normal. Vascular/Lymphatic: No abdominal aorta or iliac aneurysm. Mild  atherosclerotic plaque of the aorta and its branches. Persistent retroperitoneal lymphadenopathy as an example a 1.8 cm left periaortic lymph node (3:34). Difficult to delineate suggestion of portacaval lymphadenopathy (3:18). Reproductive: The prostate is enlarged and heterogeneous measuring up to 7.3 cm. Other: Interval increase in trace simple fluid ascites. No intraperitoneal free gas. No organized fluid collection. Musculoskeletal: No abdominal wall hernia or abnormality. No suspicious lytic or blastic osseous lesions. No acute displaced fracture. Multilevel degenerative changes of the spine. IMPRESSION: 1. Question development of acute pancreatitis.  Correlate with lipase levels. 2. Proximal pancreatic 2.9 cm cystic mass with interval worsening of severe intra and extrahepatic biliary ductal dilatation. Persistent poorly visualized gallbladder with gallbladder neck thickening. 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 When the patient is clinically stable and able to follow directions and hold their breath (preferably as an outpatient) further evaluation with dedicated MRI with and without contrast should be considered. 3. Persistent heterogeneous masslike intraluminal lesion within the urinary bladder. Finding could represent a urinary bladder or prostatic mass versus blood products. Recommend urologic consultation. 4. Marked Prostatomegaly and heterogeneity of the prostate gland. 5. Retroperitoneal and likely portacaval lymphadenopathy. 6. Colonic diverticulosis with no acute diverticulitis. 7. Question diffuse colonic bowel thickening with no definite findings of colitis. 8. Trace simple rib fluid ascites. Electronically Signed   By: Iven Finn M.D.   On: 05/12/2022 00:14      Impression / Plan:   Impression: 1.  Elevated LFTs: CT concerning for hepatobiliary or pancreatic metastatic malignancy, patient presented complaining of jaundice, T.  bili elevated to 18.4-->18.5 overnight; likely obstructive process from mets/cancer 2.  Hematuria: With history of bladder neoplasm, question relation to bladder versus prostate mass, acute on chronic blood loss anemia 3.  Hyponatremia: At time of admission, resolved now 4.  Alcohol use disorder: Denies any alcohol use for several weeks (though wife reported ongoing alcohol use)  Plan: 1.  Discussed case with Dr. Rush Landmark who has agreed to do EUS/ERCP tomorrow scheduled for 1:30 PM.  Did discuss risks, benefits, limitations and alternatives with the patient and he agrees to proceed. 2.  Patient can be on a regular diet today and n.p.o. at midnight 3.  Continue to monitor LFTs and hemoglobin, mostly greater than 7 at time of procedures 4.  Continue other supportive measures  Thank you for your kind consultation, we will continue to follow.  Lavone Nian Iliana [place]  05/12/2022, 9:30 AM

## 2022-05-12 NOTE — ED Notes (Signed)
ED TO INPATIENT HANDOFF REPORT  ED Nurse Name and Phone #: Lysle Rubens RN [phone]  S Name/Age/Gender Eddie Mendoza 72 y.o. male Room/Bed: 026C/026C  Code Status   Code Status: Full Code  Home/SNF/Other Home Patient oriented to: self, place, time, and situation Is this baseline? Yes   Triage Complete: Triage complete  Chief Complaint Acute pancreatitis [K85.90]  Triage Note Pt arrives from [place] with multiple complaints: swollen abdomen and legs with associated DOE x 3 weeks and skin itching/irritation x6-8 weeks. Family also reports yellowing of eyes and facial skin being darker than usual.    Allergies No Known Allergies  Level of Care/Admitting Diagnosis ED Disposition     ED Disposition  Admit   Condition  --   [facility] Area: [place] [[id number]]  Level of Care: Telemetry Medical [104]  May place patient in observation at [facility] or [place] if equivalent level of care is available:: Yes  Covid Evaluation: Asymptomatic - no recent exposure (last 10 days) testing not required  Diagnosis: Acute pancreatitis [577.0.ICD-9-CM]  Admitting Physician: Shela Leff [id number]  Attending Physician: Shela Leff [id number]          B Medical/Surgery History Past Medical History:  Diagnosis Date   Abnormal CT scan, gallbladder 08/13/2020   Hypertension    Past Surgical History:  Procedure Laterality Date   TRANSURETHRAL RESECTION OF BLADDER TUMOR N/A 06/30/2020   Procedure:  Almyra Free OF BLEEDING, CLOT EVACUATION;  Surgeon: Ardis Hughs, MD;  Location: [facility];  Service: Urology;  Laterality: N/A;   TRANSURETHRAL RESECTION OF BLADDER TUMOR N/A 08/13/2020   Procedure: CYSTOSCOPY, CLOT EVACUATION, FULGERATION;  Surgeon: Ardis Hughs, MD;  Location: [facility];  Service: Urology;  Laterality: N/A;     A IV Location/Drains/Wounds Patient Lines/Drains/Airways Status     Active Line/Drains/Airways     Name Placement  date Placement time Site Days   Peripheral IV 05/11/22 20 G Right Antecubital 05/11/22  2240  Antecubital  1            Intake/Output Last 24 hours  Intake/Output Summary (Last 24 hours) at 05/12/2022 0217 Last data filed at 05/12/2022 0046 Gross per 24 hour  Intake 1000 ml  Output --  Net 1000 ml    Labs/Imaging Results for orders placed or performed during the hospital encounter of 05/11/22 (from the past 48 hour(s))  CBC with Differential     Status: Abnormal   Collection Time: 05/11/22  4:55 PM  Result Value Ref Range   WBC 12.5 (H) 4.0 - 10.5 K/uL   RBC 2.63 (L) 4.22 - 5.81 MIL/uL   Hemoglobin 8.2 (L) 13.0 - 17.0 g/dL   HCT 23.1 (L) 39.0 - 52.0 %   MCV 87.8 80.0 - 100.0 fL   MCH 31.2 26.0 - 34.0 pg   MCHC 35.5 30.0 - 36.0 g/dL   RDW 23.2 (H) 11.5 - 15.5 %   Platelets 479 (H) 150 - 400 K/uL   nRBC 0.0 0.0 - 0.2 %   Neutrophils Relative % 80 %   Neutro Abs 10.1 (H) 1.7 - 7.7 K/uL   Lymphocytes Relative 4 %   Lymphs Abs 0.4 (L) 0.7 - 4.0 K/uL   Monocytes Relative 11 %   Monocytes Absolute 1.3 (H) 0.1 - 1.0 K/uL   Eosinophils Relative 1 %   Eosinophils Absolute 0.2 0.0 - 0.5 K/uL   Basophils Relative 1 %   Basophils Absolute 0.1 0.0 - 0.1 K/uL  WBC Morphology MILD LEFT SHIFT (1-5% METAS, OCC MYELO, OCC BANDS)    RBC Morphology TARGET CELLS    Smear Review PLATELET COUNT CONFIRMED BY SMEAR    Immature Granulocytes 3 %   Abs Immature Granulocytes 0.39 (H) 0.00 - 0.07 K/uL    Comment: Performed at [facility] [address], [place], [place] [address]  Comprehensive metabolic panel     Status: Abnormal   Collection Time: 05/11/22  4:55 PM  Result Value Ref Range   Sodium 133 (L) 135 - 145 mmol/L   Potassium 3.5 3.5 - 5.1 mmol/L   Chloride 107 98 - 111 mmol/L   CO2 17 (L) 22 - 32 mmol/L   Glucose, Bld 122 (H) 70 - 99 mg/dL    Comment: Glucose reference range applies only to samples taken after fasting for at least 8 hours.   BUN 15 8 - 23 mg/dL    Creatinine, Ser 0.87 0.61 - 1.24 mg/dL   Calcium 8.0 (L) 8.9 - 10.3 mg/dL   Total Protein 6.0 (L) 6.5 - 8.1 g/dL   Albumin 1.8 (L) 3.5 - 5.0 g/dL   AST 50 (H) 15 - 41 U/L   ALT 59 (H) 0 - 44 U/L   Alkaline Phosphatase 380 (H) 38 - 126 U/L   Total Bilirubin 18.4 (HH) 0.3 - 1.2 mg/dL    Comment: CRITICAL RESULT CALLED TO, READ BACK BY AND VERIFIED WITH S.BERTRAND,RN @1742$  05/11/2022 VANG.J   GFR, Estimated >60 >60 mL/min    Comment: (NOTE) Calculated using the CKD-EPI Creatinine Equation (2021)    Anion gap 9 5 - 15    Comment: Performed at [facility], [place] [address] [place], [place] [address]  Lipase, blood     Status: None   Collection Time: 05/11/22  4:55 PM  Result Value Ref Range   Lipase 41 11 - 51 U/L    Comment: Performed at [facility] [address] [place], [place] [address]  Protime-INR     Status: None   Collection Time: 05/12/22 12:15 AM  Result Value Ref Range   Prothrombin Time 13.5 11.4 - 15.2 seconds   INR 1.0 0.8 - 1.2    Comment: (NOTE) INR goal varies based on device and disease states. Performed at [facility], [place] [address] [place], [place] [address]    CT ABDOMEN PELVIS W CONTRAST  Result Date: 05/12/2022 CLINICAL DATA:  Biliary obstruction suspected (Ped 0-17y) EXAM: CT ABDOMEN AND PELVIS WITH CONTRAST TECHNIQUE: Multidetector CT imaging of the abdomen and pelvis was performed using the standard protocol following bolus administration of intravenous contrast. RADIATION DOSE REDUCTION: This exam was performed according to the departmental dose-optimization program which includes automated exposure control, adjustment of the mA and/or kV according to patient size and/or use of iterative reconstruction technique. CONTRAST:  71m OMNIPAQUE IOHEXOL 350 MG/ML SOLN COMPARISON:  CT abdomen pelvis 03/21/2022 FINDINGS: Lower chest: No acute abnormality. Hepatobiliary: No focal liver abnormality. Poorly visualized gallbladder with persistent  gallbladder wall thickening around the neck (3:24). Interval worsening of severe intra and extrahepatic biliary ductal dilatation. Pancreas: Persistent stable 2.9 x 2.4 cm proximal pancreatic duct hypodense lesion. Slightly hazy pancreatic contour proximally with trace fat stranding. No main pancreatic ductal dilatation. Spleen: Normal in size without focal abnormality. Adrenals/Urinary Tract: No adrenal nodule bilaterally. Bilateral kidneys enhance symmetrically. No hydronephrosis. No hydroureter. Redemonstration of a heterogeneous masslike intraluminal lesion within the urinary bladder. Persistent urine bladder wall thickening. On  delayed imaging, there is no urothelial wall thickening and there are no filling defects in the opacified portions of the bilateral collecting systems or ureters. Stomach/Bowel: Stomach is within normal limits. No evidence of small bowel wall thickening or dilatation. No large bowel dilatation. Diffuse large bowel wall thickening. No pericolonic fat stranding. Scattered colonic diverticula. Appendix appears normal. Vascular/Lymphatic: No abdominal aorta or iliac aneurysm. Mild atherosclerotic plaque of the aorta and its branches. Persistent retroperitoneal lymphadenopathy as an example a 1.8 cm left periaortic lymph node (3:34). Difficult to delineate suggestion of portacaval lymphadenopathy (3:18). Reproductive: The prostate is enlarged and heterogeneous measuring up to 7.3 cm. Other: Interval increase in trace simple fluid ascites. No intraperitoneal free gas. No organized fluid collection. Musculoskeletal: No abdominal wall hernia or abnormality. No suspicious lytic or blastic osseous lesions. No acute displaced fracture. Multilevel degenerative changes of the spine. IMPRESSION: 1. Question development of acute pancreatitis. Correlate with lipase levels. 2. Proximal pancreatic 2.9 cm cystic mass with interval worsening of severe intra and extrahepatic biliary ductal dilatation.  Persistent poorly visualized gallbladder with gallbladder neck thickening. 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 When the patient is clinically stable and able to follow directions and hold their breath (preferably as an outpatient) further evaluation with dedicated MRI with and without contrast should be considered. 3. Persistent heterogeneous masslike intraluminal lesion within the urinary bladder. Finding could represent a urinary bladder or prostatic mass versus blood products. Recommend urologic consultation. 4. Marked Prostatomegaly and heterogeneity of the prostate gland. 5. Retroperitoneal and likely portacaval lymphadenopathy. 6. Colonic diverticulosis with no acute diverticulitis. 7. Question diffuse colonic bowel thickening with no definite findings of colitis. 8. Trace simple rib fluid ascites. Electronically Signed   By: Iven Finn M.D.   On: 05/12/2022 00:14    Pending Labs Unresulted Labs (From admission, onward)     Start     Ordered   05/12/22 0500  Protime-INR  Tomorrow morning,   R        05/12/22 0206   05/12/22 0500  CBC  Tomorrow morning,   R        05/12/22 0206   05/12/22 0500  Comprehensive metabolic panel  Tomorrow morning,   R        05/12/22 0206   05/12/22 0216  Type and screen [facility]  Once,   R       Comments: [place]    05/12/22 0215   05/11/22 1651  Urinalysis, Routine w reflex microscopic -Urine, Clean Catch  (ED Abdominal Pain)  Once,   URGENT       Question:  Specimen Source  Answer:  Urine, Clean Catch   05/11/22 1650            Vitals/Pain Today's Vitals   05/11/22 1639 05/11/22 2056 05/11/22 2300 05/12/22 0100  BP:  135/63 (!) 141/74 (!) 130/103  Pulse:  74 70 72  Resp:  18 17 17  $ Temp:  98.8 F (37.1 C)    TempSrc:  Oral    SpO2:  99% 99% 98%  PainSc: 0-No pain       Isolation Precautions No active  isolations  Medications Medications  0.9 %  sodium chloride infusion (has no administration in time range)  LORazepam (ATIVAN) tablet 1-4 mg (has no administration in time range)    Or  LORazepam (ATIVAN) injection 1-4 mg (has no administration in time range)  thiamine (VITAMIN B1) tablet 100  mg (has no administration in time range)    Or  thiamine (VITAMIN B1) injection 100 mg (has no administration in time range)  folic acid (FOLVITE) tablet 1 mg (has no administration in time range)  multivitamin with minerals tablet 1 tablet (has no administration in time range)  sodium chloride 0.9 % bolus 1,000 mL (0 mLs Intravenous Stopped 05/12/22 0046)  iohexol (OMNIPAQUE) 350 MG/ML injection 75 mL (75 mLs Intravenous Contrast Given 05/11/22 2340)    Mobility walks     Focused Assessments Cardiac Assessment Handoff:    No results found for: "CKTOTAL", "CKMB", "CKMBINDEX", "TROPONINI" No results found for: "DDIMER" Does the Patient currently have chest pain? No   , Neuro Assessment Handoff:  Swallow screen pass?  Na         Neuro Assessment:   Neuro Checks:      Has TPA been given? No If patient is a Neuro Trauma and patient is going to OR before floor call report to [place] nurse: [phone] or [phone]  , Renal Assessment Handoff:  Hemodialysis Schedule:  Last Hemodialysis date and time:   Restricted appendage:   , Pulmonary Assessment Handoff:  Lung sounds:   O2 Device: Room Air      R Recommendations: See Admitting Provider Note  Report given to:   Additional Notes:

## 2022-05-12 NOTE — H&P (Signed)
History and Physical    DORRIEN Mendoza [id number] DOB: 09/20/1950 DOA: 05/11/2022  PCP: [facility]  Patient coming from: Home  Chief Complaint: Jaundice  HPI: Eddie Mendoza is a 72 y.o. male with medical history significant of history of transurethral resection of bladder tumor in 2022, hematuria, anemia, BPH, substance abuse, hypertension.  Seen in the ED on 03/21/2022 for abdominal pain and hematuria.  CT abdomen pelvis done at that time was concerning for bladder neoplasm, gallbladder/biliary neoplasm, cystic lesions in the pancreatic head, and metastatic disease. Hospital admission for further workup was recommended at that time but patient left the ED AMA.  Patient now returns to the ED complaining of jaundice, itching, and weight loss.  He reported history of chronic alcohol use.  Vital signs on arrival to the ED: Temperature 98.9 F, pulse 72, respiratory rate 16, blood pressure 144/61, SpO2 100% on room air.  Labs significant for WBC 12.5, hemoglobin 8.2, MCV 87.8, platelet count 479k, sodium 133, bicarb 17, anion gap 9, glucose 122, creatinine 0.8, calcium 8.0, albumin 1.8, AST 50, ALT 59, alk phos 380, T. bili 18.4, lipase normal, INR 1.0, UA pending.  CT abdomen pelvis with contrast showing: "IMPRESSION: 1. Question development of acute pancreatitis. Correlate with lipase levels. 2. Proximal pancreatic 2.9 cm cystic mass with interval worsening of severe intra and extrahepatic biliary ductal dilatation. Persistent poorly visualized gallbladder with gallbladder neck thickening. 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 When the patient is clinically stable and able to follow directions and hold their breath (preferably as an outpatient) further evaluation with dedicated MRI with and without contrast should be considered. 3. Persistent heterogeneous masslike intraluminal lesion within the urinary bladder.  Finding could represent a urinary bladder or prostatic mass versus blood products. Recommend urologic consultation. 4. Marked Prostatomegaly and heterogeneity of the prostate gland. 5. Retroperitoneal and likely portacaval lymphadenopathy. 6. Colonic diverticulosis with no acute diverticulitis. 7. Question diffuse colonic bowel thickening with no definite findings of colitis. 8. Trace simple rib fluid ascites."  ED PA discussed the case with Dr. Felipa Eth with urology who felt that the patient is currently not a surgical candidate due to his other medical issues.  He recommended outpatient follow-up with urology for biopsy of the bladder mass.  [facility] has also been consulted (Dr. Carlean Purl).  Patient was given 1 L normal saline bolus and hydroxyzine.  [facility] called to admit.  Patient states for the past few weeks he is having yellowing of his skin, itching all over, and night sweats.  He thinks he has lost about 20 pounds in the past few weeks but has not lost his appetite.  Denies any abdominal pain, nausea, or vomiting.  Denies shortness of breath or chest pain.  Endorsing intermittent hematuria.  States he went to the [place] and they sent him here for further evaluation.  He has no other complaints.  Review of Systems:  Review of Systems  All other systems reviewed and are negative.   Past Medical History:  Diagnosis Date   Abnormal CT scan, gallbladder 08/13/2020   Hypertension     Past Surgical History:  Procedure Laterality Date   TRANSURETHRAL RESECTION OF BLADDER TUMOR N/A 06/30/2020   Procedure:  Almyra Free OF BLEEDING, CLOT EVACUATION;  Surgeon: Ardis Hughs, MD;  Location: [facility];  Service: Urology;  Laterality: N/A;   TRANSURETHRAL RESECTION OF BLADDER TUMOR N/A 08/13/2020   Procedure: CYSTOSCOPY, CLOT EVACUATION, FULGERATION;  Surgeon: Louis Meckel  W, MD;  Location: [facility];  Service: Urology;  Laterality: N/A;     reports that he has been smoking cigarettes. He has  a 15.00 pack-year smoking history. He has never used smokeless tobacco. He reports current alcohol use of about 3.0 standard drinks of alcohol per week. He reports that he does not use drugs.  No Known Allergies  History reviewed. No pertinent family history.  Prior to Admission medications   Medication Sig Start Date End Date Taking? Authorizing Provider  chlorthalidone (HYGROTON) 25 MG tablet Take 1 tablet by mouth daily. 07/29/20   [provider]  finasteride (PROSCAR) 5 MG tablet Take 1 tablet (5 mg total) by mouth daily. 07/02/20   Patrecia Pour, MD  lidocaine (XYLOCAINE) 5 % ointment Apply 1 application topically 4 (four) times daily as needed for mild pain or moderate pain. 04/15/20   [provider]  losartan (COZAAR) 100 MG tablet Take 50 mg by mouth daily. 07/29/20   [provider]  ondansetron (ZOFRAN) 4 MG tablet Take 1 tablet (4 mg total) by mouth every 6 (six) hours. 03/21/22   Sherrye Payor A, PA-C  oxyCODONE (ROXICODONE) 5 MG immediate release tablet Take 1 tablet (5 mg total) by mouth every 6 (six) hours as needed for up to 12 doses for severe pain. 03/21/22   Sherrye Payor A, PA-C  polyethylene glycol (MIRALAX / GLYCOLAX) 17 g packet Take 17 g by mouth daily as needed for mild constipation. 06/22/20   Pokhrel, Corrie Mckusick, MD  tamsulosin (FLOMAX) 0.4 MG CAPS capsule Take 1 capsule (0.4 mg total) by mouth daily. 07/01/20   Patrecia Pour, MD    Physical Exam: Vitals:   05/11/22 1638 05/11/22 2056 05/11/22 2300 05/12/22 0100  BP: (!) 144/61 135/63 (!) 141/74 (!) 130/103  Pulse: 72 74 70 72  Resp: 16 18 17 17  $ Temp: 98.9 F (37.2 C) 98.8 F (37.1 C)    TempSrc:  Oral    SpO2: 100% 99% 99% 98%    Physical Exam Vitals reviewed.  Constitutional:      General: He is not in acute distress. HENT:     Head: Normocephalic and atraumatic.  Eyes:     General: Scleral icterus present.     Extraocular Movements: Extraocular movements intact.  Cardiovascular:      Rate and Rhythm: Normal rate and regular rhythm.     Pulses: Normal pulses.  Pulmonary:     Effort: Pulmonary effort is normal. No respiratory distress.     Breath sounds: Normal breath sounds. No wheezing or rales.  Abdominal:     General: Bowel sounds are normal. There is distension.     Palpations: Abdomen is soft.     Tenderness: There is no abdominal tenderness. There is no guarding.  Musculoskeletal:     Cervical back: Normal range of motion.     Right lower leg: Edema present.     Left lower leg: Edema present.     Comments: 2+ pitting edema of bilateral lower extremities  Skin:    General: Skin is warm and dry.     Coloration: Skin is jaundiced.  Neurological:     General: No focal deficit present.     Mental Status: He is alert and oriented to person, place, and time.     Labs on Admission: I have personally reviewed following labs and imaging studies  CBC: Recent Labs  Lab 05/11/22 1655  WBC 12.5*  NEUTROABS 10.1*  HGB 8.2*  HCT 23.1*  MCV 87.8  PLT 123456*   Basic Metabolic Panel: Recent Labs  Lab 05/11/22 1655  NA 133*  K 3.5  CL 107  CO2 17*  GLUCOSE 122*  BUN 15  CREATININE 0.87  CALCIUM 8.0*   GFR: CrCl cannot be calculated (Unknown ideal weight.). Liver Function Tests: Recent Labs  Lab 05/11/22 1655  AST 50*  ALT 59*  ALKPHOS 380*  BILITOT 18.4*  PROT 6.0*  ALBUMIN 1.8*   Recent Labs  Lab 05/11/22 1655  LIPASE 41   No results for input(s): "AMMONIA" in the last 168 hours. Coagulation Profile: Recent Labs  Lab 05/12/22 0015  INR 1.0   Cardiac Enzymes: No results for input(s): "CKTOTAL", "CKMB", "CKMBINDEX", "TROPONINI" in the last 168 hours. BNP (last 3 results) No results for input(s): "PROBNP" in the last 8760 hours. HbA1C: No results for input(s): "HGBA1C" in the last 72 hours. CBG: No results for input(s): "GLUCAP" in the last 168 hours. Lipid Profile: No results for input(s): "CHOL", "HDL", "LDLCALC", "TRIG",  "CHOLHDL", "LDLDIRECT" in the last 72 hours. Thyroid Function Tests: No results for input(s): "TSH", "T4TOTAL", "FREET4", "T3FREE", "THYROIDAB" in the last 72 hours. Anemia Panel: No results for input(s): "VITAMINB12", "FOLATE", "FERRITIN", "TIBC", "IRON", "RETICCTPCT" in the last 72 hours. Urine analysis:    Component Value Date/Time   COLORURINE YELLOW 04/13/2022 1353   APPEARANCEUR CLEAR 04/13/2022 1353   LABSPEC 1.017 04/13/2022 1353   PHURINE 5.0 04/13/2022 1353   GLUCOSEU NEGATIVE 04/13/2022 1353   HGBUR NEGATIVE 04/13/2022 1353   BILIRUBINUR SMALL (A) 04/13/2022 1353   KETONESUR NEGATIVE 04/13/2022 1353   PROTEINUR 30 (A) 04/13/2022 1353   UROBILINOGEN 0.2 08/15/2009 1010   NITRITE NEGATIVE 04/13/2022 1353   LEUKOCYTESUR TRACE (A) 04/13/2022 1353    Radiological Exams on Admission: CT ABDOMEN PELVIS W CONTRAST  Result Date: 05/12/2022 CLINICAL DATA:  Biliary obstruction suspected (Ped 0-17y) EXAM: CT ABDOMEN AND PELVIS WITH CONTRAST TECHNIQUE: Multidetector CT imaging of the abdomen and pelvis was performed using the standard protocol following bolus administration of intravenous contrast. RADIATION DOSE REDUCTION: This exam was performed according to the departmental dose-optimization program which includes automated exposure control, adjustment of the mA and/or kV according to patient size and/or use of iterative reconstruction technique. CONTRAST:  70m OMNIPAQUE IOHEXOL 350 MG/ML SOLN COMPARISON:  CT abdomen pelvis 03/21/2022 FINDINGS: Lower chest: No acute abnormality. Hepatobiliary: No focal liver abnormality. Poorly visualized gallbladder with persistent gallbladder wall thickening around the neck (3:24). Interval worsening of severe intra and extrahepatic biliary ductal dilatation. Pancreas: Persistent stable 2.9 x 2.4 cm proximal pancreatic duct hypodense lesion. Slightly hazy pancreatic contour proximally with trace fat stranding. No main pancreatic ductal dilatation.  Spleen: Normal in size without focal abnormality. Adrenals/Urinary Tract: No adrenal nodule bilaterally. Bilateral kidneys enhance symmetrically. No hydronephrosis. No hydroureter. Redemonstration of a heterogeneous masslike intraluminal lesion within the urinary bladder. Persistent urine bladder wall thickening. On delayed imaging, there is no urothelial wall thickening and there are no filling defects in the opacified portions of the bilateral collecting systems or ureters. Stomach/Bowel: Stomach is within normal limits. No evidence of small bowel wall thickening or dilatation. No large bowel dilatation. Diffuse large bowel wall thickening. No pericolonic fat stranding. Scattered colonic diverticula. Appendix appears normal. Vascular/Lymphatic: No abdominal aorta or iliac aneurysm. Mild atherosclerotic plaque of the aorta and its branches. Persistent retroperitoneal lymphadenopathy as an example a 1.8 cm left periaortic lymph node (3:34). Difficult to delineate suggestion of portacaval lymphadenopathy (3:18). Reproductive: The prostate  is enlarged and heterogeneous measuring up to 7.3 cm. Other: Interval increase in trace simple fluid ascites. No intraperitoneal free gas. No organized fluid collection. Musculoskeletal: No abdominal wall hernia or abnormality. No suspicious lytic or blastic osseous lesions. No acute displaced fracture. Multilevel degenerative changes of the spine. IMPRESSION: 1. Question development of acute pancreatitis. Correlate with lipase levels. 2. Proximal pancreatic 2.9 cm cystic mass with interval worsening of severe intra and extrahepatic biliary ductal dilatation. Persistent poorly visualized gallbladder with gallbladder neck thickening. 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 When the patient is clinically stable and able to follow directions and hold their breath (preferably as an outpatient) further evaluation with dedicated  MRI with and without contrast should be considered. 3. Persistent heterogeneous masslike intraluminal lesion within the urinary bladder. Finding could represent a urinary bladder or prostatic mass versus blood products. Recommend urologic consultation. 4. Marked Prostatomegaly and heterogeneity of the prostate gland. 5. Retroperitoneal and likely portacaval lymphadenopathy. 6. Colonic diverticulosis with no acute diverticulitis. 7. Question diffuse colonic bowel thickening with no definite findings of colitis. 8. Trace simple rib fluid ascites. Electronically Signed   By: Iven Finn M.D.   On: 05/12/2022 00:14    Assessment and Plan  Elevated liver enzymes Concern for hepatobiliary or pancreatic metastatic malignancy Patient presenting with complaints of jaundice, itching, and unintentional weight loss.  Liver enzymes elevated with T. bili of 18.4.  CT abdomen pelvis with findings concerning for a hepatobiliary or pancreatic metastatic malignancy.  There is question of acute pancreatitis on CT but lipase is normal and patient is not having any abdominal pain, nausea, or vomiting..  I have discussed CT findings with the patient.  [facility] has been consulted.  Keep n.p.o. at this time and continue IV fluid hydration.  Repeat lipase, LFTs, and PT/INR in the morning.  Hematuria Bladder versus prostate mass Acute on chronic blood loss anemia Patient is endorsing intermittent hematuria.  History of transurethral resection of bladder tumor in 2022.  CT showing persistent heterogeneous masslike intraluminal lesion within the urinary bladder.  Radiologist thinks findings could represent a urinary bladder or prostatic mass versus blood products.  Hemoglobin 8.2, was 9.6 about a month ago.  UA pending.  Type and screen, monitor H&H. ED PA discussed the case with Dr. Felipa Eth with urology who felt that the patient is currently not a surgical candidate due to his other medical issues.  He recommended  outpatient follow-up with urology for biopsy.  I have discussed CT findings and urology recommendation with the patient.  Mild hyponatremia Sodium 133.  IV fluid hydration, continue to monitor.  Normal anion gap metabolic acidosis Bicarb 17, anion gap 9.  Continue IV fluid hydration and monitor labs.  Alcohol use disorder Patient denies any alcohol use for several weeks.  However, per ED documentation, his wife reported ongoing alcohol use.  Placed on CIWA protocol.  Hypertension Pharmacy med rec pending.  DVT prophylaxis: SCDs Code Status: Full Code (discussed with the patient) Level of care: Telemetry bed Admission status: It is my clinical opinion that referral for OBSERVATION is reasonable and necessary in this patient based on the above information provided. The aforementioned taken together are felt to place the patient at high risk for further clinical deterioration. However, it is anticipated that the patient may be medically stable for discharge from the hospital within 24 to 48 hours.   Shela Leff MD [facility]  If 7PM-7AM, please contact night-coverage [email or web address]  05/12/2022,  1:35 AM

## 2022-05-13 ENCOUNTER — Encounter (HOSPITAL_COMMUNITY)
Admission: EM | Disposition: A | Discharge status: Home / Self Care | Payer: Self-pay | Source: Home / Self Care | Attending: Student

## 2022-05-13 ENCOUNTER — Encounter (HOSPITAL_COMMUNITY): Payer: Self-pay | Admitting: Student

## 2022-05-13 ENCOUNTER — Inpatient Hospital Stay (HOSPITAL_COMMUNITY): Payer: No Typology Code available for payment source

## 2022-05-13 ENCOUNTER — Inpatient Hospital Stay (HOSPITAL_COMMUNITY): Payer: No Typology Code available for payment source | Admitting: Anesthesiology

## 2022-05-13 DIAGNOSIS — K2970 Gastritis, unspecified, without bleeding: Secondary | ICD-10-CM

## 2022-05-13 DIAGNOSIS — K862 Cyst of pancreas: Secondary | ICD-10-CM | POA: Diagnosis not present

## 2022-05-13 DIAGNOSIS — K831 Obstruction of bile duct: Secondary | ICD-10-CM

## 2022-05-13 DIAGNOSIS — E876 Hypokalemia: Secondary | ICD-10-CM

## 2022-05-13 DIAGNOSIS — R17 Unspecified jaundice: Secondary | ICD-10-CM

## 2022-05-13 DIAGNOSIS — E8809 Other disorders of plasma-protein metabolism, not elsewhere classified: Secondary | ICD-10-CM

## 2022-05-13 DIAGNOSIS — K2289 Other specified disease of esophagus: Secondary | ICD-10-CM

## 2022-05-13 DIAGNOSIS — D62 Acute posthemorrhagic anemia: Secondary | ICD-10-CM | POA: Diagnosis not present

## 2022-05-13 DIAGNOSIS — F1090 Alcohol use, unspecified, uncomplicated: Secondary | ICD-10-CM | POA: Diagnosis not present

## 2022-05-13 DIAGNOSIS — K449 Diaphragmatic hernia without obstruction or gangrene: Secondary | ICD-10-CM

## 2022-05-13 DIAGNOSIS — R748 Abnormal levels of other serum enzymes: Secondary | ICD-10-CM | POA: Diagnosis not present

## 2022-05-13 DIAGNOSIS — N3289 Other specified disorders of bladder: Secondary | ICD-10-CM | POA: Diagnosis not present

## 2022-05-13 DIAGNOSIS — R600 Localized edema: Secondary | ICD-10-CM

## 2022-05-13 DIAGNOSIS — F17210 Nicotine dependence, cigarettes, uncomplicated: Secondary | ICD-10-CM

## 2022-05-13 DIAGNOSIS — I10 Essential (primary) hypertension: Secondary | ICD-10-CM

## 2022-05-13 HISTORY — PX: BIOPSY: SHX5522

## 2022-05-13 HISTORY — PX: REMOVAL OF STONES: SHX5545

## 2022-05-13 HISTORY — PX: EUS: SHX5427

## 2022-05-13 HISTORY — PX: SPHINCTEROTOMY: SHX5544

## 2022-05-13 HISTORY — PX: ESOPHAGOGASTRODUODENOSCOPY: SHX5428

## 2022-05-13 HISTORY — PX: BILIARY BRUSHING: SHX6843

## 2022-05-13 HISTORY — PX: BILIARY STENT PLACEMENT: SHX5538

## 2022-05-13 HISTORY — PX: PANCREATIC STENT PLACEMENT: SHX5539

## 2022-05-13 HISTORY — PX: FINE NEEDLE ASPIRATION: SHX5430

## 2022-05-13 HISTORY — PX: ERCP: SHX5425

## 2022-05-13 LAB — IRON AND TIBC
Iron: 78 ug/dL (ref 45–182)
Saturation Ratios: 22 % (ref 17.9–39.5)
TIBC: 353 ug/dL (ref 250–450)
UIBC: 275 ug/dL

## 2022-05-13 LAB — COMPREHENSIVE METABOLIC PANEL
ALT: 57 U/L — ABNORMAL HIGH (ref 0–44)
AST: 52 U/L — ABNORMAL HIGH (ref 15–41)
Albumin: 1.6 g/dL — ABNORMAL LOW (ref 3.5–5.0)
Alkaline Phosphatase: 360 U/L — ABNORMAL HIGH (ref 38–126)
Anion gap: 8 (ref 5–15)
BUN: 16 mg/dL (ref 8–23)
CO2: 17 mmol/L — ABNORMAL LOW (ref 22–32)
Calcium: 8.2 mg/dL — ABNORMAL LOW (ref 8.9–10.3)
Chloride: 109 mmol/L (ref 98–111)
Creatinine, Ser: 0.9 mg/dL (ref 0.61–1.24)
GFR, Estimated: >60 mL/min (ref >60)
Glucose, Bld: 108 mg/dL — ABNORMAL HIGH (ref 70–99)
Potassium: 3.2 mmol/L — ABNORMAL LOW (ref 3.5–5.1)
Sodium: 134 mmol/L — ABNORMAL LOW (ref 135–145)
Total Bilirubin: 17.4 mg/dL — ABNORMAL HIGH (ref 0.3–1.2)
Total Protein: 5.7 g/dL — ABNORMAL LOW (ref 6.5–8.1)

## 2022-05-13 LAB — MAGNESIUM: Magnesium: 2.4 mg/dL (ref 1.7–2.4)

## 2022-05-13 LAB — RETICULOCYTES
Immature Retic Fract: 15.9 % (ref 2.3–15.9)
RBC.: 2.6 MIL/uL — ABNORMAL LOW (ref 4.22–5.81)
Retic Count, Absolute: 92.8 10*3/uL (ref 19.0–186.0)
Retic Ct Pct: 3.6 % — ABNORMAL HIGH (ref 0.4–3.1)

## 2022-05-13 LAB — CBC
HCT: 22.3 % — ABNORMAL LOW (ref 39.0–52.0)
Hemoglobin: 8.1 g/dL — ABNORMAL LOW (ref 13.0–17.0)
MCH: 31.9 pg (ref 26.0–34.0)
MCHC: 36.3 g/dL — ABNORMAL HIGH (ref 30.0–36.0)
MCV: 87.8 fL (ref 80.0–100.0)
Platelets: 481 10*3/uL — ABNORMAL HIGH (ref 150–400)
RBC: 2.54 MIL/uL — ABNORMAL LOW (ref 4.22–5.81)
RDW: 23.3 % — ABNORMAL HIGH (ref 11.5–15.5)
WBC: 10.2 10*3/uL (ref 4.0–10.5)
nRBC: 0 % (ref 0.0–0.2)

## 2022-05-13 LAB — FOLATE: Folate: 12.2 ng/mL (ref >5.9)

## 2022-05-13 LAB — VITAMIN B12: Vitamin B-12: 527 pg/mL (ref 180–914)

## 2022-05-13 LAB — PHOSPHORUS: Phosphorus: 3.6 mg/dL (ref 2.5–4.6)

## 2022-05-13 LAB — FERRITIN: Ferritin: 75 ng/mL (ref 24–336)

## 2022-05-13 SURGERY — ULTRASOUND, UPPER GI TRACT, ENDOSCOPIC
Anesthesia: General

## 2022-05-13 MED ORDER — PROPOFOL 10 MG/ML IV BOLUS
INTRAVENOUS | Status: DC | PRN
  Administered 2022-05-13: 150 mg via INTRAVENOUS

## 2022-05-13 MED ORDER — IPRATROPIUM-ALBUTEROL 0.5-2.5 (3) MG/3ML IN SOLN
RESPIRATORY_TRACT | Status: AC
  Filled 2022-05-13: qty 3

## 2022-05-13 MED ORDER — POTASSIUM CHLORIDE 10 MEQ/100ML IV SOLN
10.0000 meq | INTRAVENOUS | Status: AC
  Administered 2022-05-13 (×3): 10 meq via INTRAVENOUS
  Filled 2022-05-13 (×4): qty 100

## 2022-05-13 MED ORDER — GLUCAGON HCL RDNA (DIAGNOSTIC) 1 MG IJ SOLR
INTRAMUSCULAR | Status: AC
  Filled 2022-05-13: qty 1

## 2022-05-13 MED ORDER — POTASSIUM CHLORIDE 2 MEQ/ML IV SOLN
INTRAVENOUS | Status: AC
  Filled 2022-05-13 (×2): qty 1000

## 2022-05-13 MED ORDER — FENTANYL CITRATE (PF) 250 MCG/5ML IJ SOLN
INTRAMUSCULAR | Status: AC
  Filled 2022-05-13: qty 5

## 2022-05-13 MED ORDER — SUGAMMADEX SODIUM 200 MG/2ML IV SOLN
INTRAVENOUS | Status: DC | PRN
  Administered 2022-05-13: 200 mg via INTRAVENOUS

## 2022-05-13 MED ORDER — ONDANSETRON HCL 4 MG/2ML IJ SOLN
INTRAMUSCULAR | Status: DC | PRN
  Administered 2022-05-13: 4 mg via INTRAVENOUS

## 2022-05-13 MED ORDER — FENTANYL CITRATE (PF) 100 MCG/2ML IJ SOLN
INTRAMUSCULAR | Status: DC | PRN
  Administered 2022-05-13: 25 ug via INTRAVENOUS
  Administered 2022-05-13: 50 ug via INTRAVENOUS
  Administered 2022-05-13: 25 ug via INTRAVENOUS

## 2022-05-13 MED ORDER — CIPROFLOXACIN IN D5W 400 MG/200ML IV SOLN
INTRAVENOUS | Status: AC
  Filled 2022-05-13: qty 200

## 2022-05-13 MED ORDER — DICLOFENAC SUPPOSITORY 100 MG
RECTAL | Status: DC | PRN
  Administered 2022-05-13: 100 mg via RECTAL

## 2022-05-13 MED ORDER — DICLOFENAC SUPPOSITORY 100 MG
RECTAL | Status: AC
  Filled 2022-05-13: qty 1

## 2022-05-13 MED ORDER — CIPROFLOXACIN IN D5W 400 MG/200ML IV SOLN
INTRAVENOUS | Status: DC | PRN
  Administered 2022-05-13: 400 mg via INTRAVENOUS

## 2022-05-13 MED ORDER — SODIUM CHLORIDE 0.9 % IV SOLN
INTRAVENOUS | Status: DC | PRN
  Administered 2022-05-13: 35 mL

## 2022-05-13 MED ORDER — INDOMETHACIN 50 MG RE SUPP
RECTAL | Status: AC
  Filled 2022-05-13: qty 2

## 2022-05-13 MED ORDER — IPRATROPIUM-ALBUTEROL 0.5-2.5 (3) MG/3ML IN SOLN
3.0000 mL | RESPIRATORY_TRACT | Status: DC
  Administered 2022-05-13: 3 mL via RESPIRATORY_TRACT

## 2022-05-13 MED ORDER — GLUCAGON HCL RDNA (DIAGNOSTIC) 1 MG IJ SOLR
INTRAMUSCULAR | Status: DC | PRN
  Administered 2022-05-13 (×4): .25 mg via INTRAVENOUS

## 2022-05-13 MED ORDER — LIDOCAINE 2% (20 MG/ML) 5 ML SYRINGE
INTRAMUSCULAR | Status: DC | PRN
  Administered 2022-05-13: 100 mg via INTRAVENOUS

## 2022-05-13 MED ORDER — LACTATED RINGERS IV SOLN: INTRAVENOUS | Status: DC | PRN

## 2022-05-13 MED ORDER — ROCURONIUM BROMIDE 10 MG/ML (PF) SYRINGE
PREFILLED_SYRINGE | INTRAVENOUS | Status: DC | PRN
  Administered 2022-05-13: 60 mg via INTRAVENOUS
  Administered 2022-05-13 (×2): 20 mg via INTRAVENOUS

## 2022-05-13 NOTE — Op Note (Signed)
[facility] Patient Name: Eddie Mendoza Procedure Date : 05/13/2022 MRN: [id number] Attending MD: Justice Britain , MD, [id number] Date of Birth: November 11, 1950 CSN: [id number] Age: 72 Admit Type: Inpatient Procedure:                ERCP Indications:              Common bile duct stricture, Biliary dilation on                            Computed Tomogram Scan, Jaundice, Elevated liver                            enzymes Providers:                Justice Britain, MD, Jaci Carrel, RN, Frazier Richards, Technician, Gloris Ham, Technician,                            Sampson Si, CRNA Referring MD:             Inpatient Medical Service Medicines:                General Anesthesia, Cipro 400 mg IV, Diclofenac 100                            mg rectal, Glucagon 1 mg IV Complications:            No immediate complications. Estimated Blood Loss:     Estimated blood loss was minimal. Procedure:                Pre-Anesthesia Assessment:                           - Prior to the procedure, a History and Physical                            was performed, and patient medications and                            allergies were reviewed. The patient's tolerance of                            previous anesthesia was also reviewed. The risks                            and benefits of the procedure and the sedation                            options and risks were discussed with the patient.                            All questions were answered, and informed consent  was obtained. Prior Anticoagulants: The patient has                            taken no anticoagulant or antiplatelet agents. ASA                            Grade Assessment: III - A patient with severe                            systemic disease. After reviewing the risks and                            benefits, the patient was deemed in satisfactory                             condition to undergo the procedure.                           After obtaining informed consent, the scope was                            passed under direct vision. Throughout the                            procedure, the patient's blood pressure, pulse, and                            oxygen saturations were monitored continuously. The                            [employer] D single use                            duodenoscope was introduced through the mouth, and                            used to inject contrast into and used to inject                            contrast into the bile duct and ventral pancreatic                            duct. The ERCP was unusually difficult due to                            abnormal anatomy and difficulty passing guidewires                            through biliary ductal stenosis. Successful                            completion of the procedure was aided by performing  the maneuvers documented (below) in this report.                            The patient tolerated the procedure. Scope In: Scope Out: Findings:      The scout film was normal.      The esophagus was successfully intubated under direct vision without       detailed examination of the pharynx, larynx, and associated structures,       and upper GI tract. The major papilla was congested and small.      Repeated attempts at biliary cannulation were not successful while using       a wire-guided approach. This led to placement of the wire within the       pancreatic duct on 2 occasions. Decision was made to pursue a       double-wire approach. The wire was left within the pancreatic duct. I       continued to have difficulty with accessing the biliary tree using the       double-wire technique. I went into a semi-long position which allowed a       bit more improvement in the angulation. Subsequetly a short 0.035 inch       Soft  Jagwire was felt to be passed into the biliary tree but was       difficult to traverse a region without looping (this was felt to be a       likely strictured region). I then placed the Hydratome sphincterotome       was passed over the guidewire and cannulated. Contrast was injected       slowly at first and it did lead to the pancreatic duct and the bile duct       both having flow. I then went deeper with the sphincterotome and the       bile duct looked to be filling preferentially at that point. I       personally interpreted the bile duct and pancreatic duct images. Ductal       flow of contrast was adequate. Image quality was adequate. Contrast       extended to the hepatic ducts. Contrast extended to the pancreatic duct.       Opacification of the entire biliary tree except for the gallbladder was       successful. The in the biliary system were normal. The middle third of       the main bile duct contained a single severe stenosis 20 mm in length.       The hepatic duct bifurcation and left and right hepatic ducts and all       intrahepatic branches were diffusely dilated, secondary to a stricture.       The largest diameter was 20 mm. A 5 mm biliary sphincterotomy was made       with a monofilament Hydratome sphincterotome using ERBE electrocautery       due to the small size of the papilla. There was no post-sphincterotomy       bleeding. To discover objects, the biliary tree was swept with a       retrieval balloon. Small amount of sludge was swept from the duct. An       occlusion cholangiogram was performed that showed the above findings and       significant stricutre. Cells for cytology (using 2 brushes) were  obtained by brushing in the middle third of the main bile duct. One 10       Fr by 9 cm plastic biliary stent with a single external flap and a       single internal flap was placed into the common bile duct. The stent was       in good position.      One 4 Fr by  7 cm temporary plastic pancreatic stent with a single       external pigtail was placed into the ventral pancreatic duct. The stent       was in good position.      The duodenoscope was withdrawn from the patient. Impression:               - The major papilla appeared congested and small.                           - Very difficult cannulation requiring double wire                            technique.                           - The patient has a shared portion of the distal                            duct such that there was both pancreatic and                            biliary injection noted during initial cannulation.                           - Prophylactic pancreatic stent placed.                           - The biliary tree was swept and sludge was found.                           - A single severe biliary stricture was found in                            the middle third of the main bile duct. The                            stricture was malignant appearing. It was brushed x                            2. This stricture was then stented with one plastic                            stent. Recommendation:           - The patient will be observed post-procedure,                            until all discharge criteria are met.                           -  Return patient to hospital ward for ongoing care.                           - Advance diet as tolerated.                           - Observe patient's clinical course.                           - Await cytology results, await path results and                            await tumor markers.                           - Recommend continuing antibiotics for at least the                            next 3-days.                           - Watch for pancreatitis, bleeding, perforation,                            and cholangitis.                           - The findings and recommendations were discussed                            with the  patient.                           - The findings and recommendations were discussed                            with the referring physician. Procedure Code(s):        --- Professional ---                           [phone], Endoscopic retrograde                            cholangiopancreatography (ERCP); with placement of                            endoscopic stent into biliary or pancreatic duct,                            including pre- and post-dilation and guide wire                            passage, when performed, including sphincterotomy,                            when performed, each stent  [id number], 72, Endoscopic retrograde                            cholangiopancreatography (ERCP); with placement of                            endoscopic stent into biliary or pancreatic duct,                            including pre- and post-dilation and guide wire                            passage, when performed, including sphincterotomy,                            when performed, each stent                           [id number], Endoscopic retrograde                            cholangiopancreatography (ERCP); with removal of                            calculi/debris from biliary/pancreatic duct(s)                           [id number], 26, Combined endoscopic catheterization of                            the biliary and pancreatic ductal systems,                            radiological supervision and interpretation Diagnosis Code(s):        --- Professional ---                           K83.8, Other specified diseases of biliary tract                           K83.1, Obstruction of bile duct                           R17, Unspecified jaundice                           R74.8, Abnormal levels of other serum enzymes CPT copyright 2022 American [employer]. All rights reserved. The codes documented in this report are preliminary and upon coder review may  be revised to meet  current compliance requirements. Justice Britain, MD 05/13/2022 6:22:39 PM Number of Addenda: 0

## 2022-05-13 NOTE — Anesthesia Preprocedure Evaluation (Addendum)
Anesthesia Evaluation  Patient identified by MRN, date of birth, ID band Patient awake    Reviewed: Allergy & Precautions, H&P , NPO status , Patient's Chart, lab work & pertinent test results  History of Anesthesia Complications Negative for: history of anesthetic complications  Airway Mallampati: II  TM Distance: >3 FB Neck ROM: Full    Dental no notable dental hx. (+) Dental Advisory Given   Pulmonary Current Smoker   Pulmonary exam normal        Cardiovascular hypertension, Pt. on medications Normal cardiovascular exam     Neuro/Psych negative neurological ROS  negative psych ROS   GI/Hepatic negative GI ROS,,,  Endo/Other  negative endocrine ROS    Renal/GU negative Renal ROS     Musculoskeletal   Abdominal   Peds  Hematology  (+) Blood dyscrasia, anemia   Anesthesia Other Findings   Reproductive/Obstetrics                             Anesthesia Physical Anesthesia Plan  ASA: 3  Anesthesia Plan: General   Post-op Pain Management: Minimal or no pain anticipated   Induction: Intravenous  PONV Risk Score and Plan: 1 and Ondansetron  Airway Management Planned: Oral ETT  Additional Equipment:   Intra-op Plan:   Post-operative Plan: Extubation in OR  Informed Consent: I have reviewed the patients History and Physical, chart, labs and discussed the procedure including the risks, benefits and alternatives for the proposed anesthesia with the patient or authorized representative who has indicated his/her understanding and acceptance.     Dental advisory given  Plan Discussed with: Anesthesiologist and CRNA  Anesthesia Plan Comments:         Anesthesia Quick Evaluation

## 2022-05-13 NOTE — Op Note (Signed)
[facility] Patient Name: Eddie Mendoza Procedure Date : 05/13/2022 MRN: [id number] Attending MD: Justice Britain , MD, [id number] Date of Birth: Apr 04, 1950 CSN: [id number] Age: 72 Admit Type: Inpatient Procedure:                Upper EUS Indications:              Lymphadenopathy on CT scan, Pancreatic cyst on CT                            scan, Obstruction of bile duct on CT, Elevated                            liver enzymes Providers:                Justice Britain, MD, Jaci Carrel, RN, Frazier Richards, Technician, Gloris Ham, Technician,                            Sampson Si, CRNA Referring MD:              Medicines:                General Anesthesia Complications:            No immediate complications. Estimated Blood Loss:     Estimated blood loss was minimal. Procedure:                Pre-Anesthesia Assessment:                           - Prior to the procedure, a History and Physical                            was performed, and patient medications and                            allergies were reviewed. The patient's tolerance of                            previous anesthesia was also reviewed. The risks                            and benefits of the procedure and the sedation                            options and risks were discussed with the patient.                            All questions were answered, and informed consent                            was obtained. Prior Anticoagulants: The patient has  taken no anticoagulant or antiplatelet agents. ASA                            Grade Assessment: III - A patient with severe                            systemic disease. After reviewing the risks and                            benefits, the patient was deemed in satisfactory                            condition to undergo the procedure.                           After obtaining informed  consent, the endoscope was                            passed under direct vision. Throughout the                            procedure, the patient's blood pressure, pulse, and                            oxygen saturations were monitored continuously. The                            [id number] Olympus endoscope was introduced                            through the mouth, and advanced to the second part                            of duodenum. The [id number] Olympus linear                            ultrasound scope was introduced through the mouth,                            and advanced to the second part of duodenum. The                            upper EUS was accomplished without difficulty. The                            patient tolerated the procedure. Scope In: Scope Out: Findings:      ENDOSCOPIC FINDING: :      No gross lesions were noted in the entire esophagus.      The Z-line was irregular and was found 41 cm from the incisors.      A 1 cm hiatal hernia was present.      Segmental moderate inflammation characterized by erosions, erythema and       granularity was found in the entire examined stomach. Biopsies were  taken with a cold forceps for histology and Helicobacter pylori testing.      A J-shaped deformity was found in the gastric body.      Diffuse severely congested mucosa without active bleeding and with no       stigmata of bleeding was found in the duodenal bulb, in the first       portion of the duodenum, in the second portion of the duodenum and in       the area of the papilla.      ENDOSONOGRAPHIC FINDING: :      There was dilation in the middle third of the main bile duct which       measured up to 12 mm.      An anechoic lesion suggestive of a cyst was identified in the pancreatic       head. It is not in obvious communication with the pancreatic duct. The       lesion measured 30 mm by 24 mm in maximal cross-sectional diameter.        There was a single compartment thinly septated. The outer wall of the       lesion was thin. There was an intrinsic associated mass. There was       internal debris within the fluid-filled cavity. Diagnostic needle       aspiration for fluid was performed. Color Doppler imaging was utilized       prior to needle puncture to confirm a lack of significant vascular       structures within the needle path. One pass was made with the 22 gauge       needle using a transduodenal approach. No stylet was used. The amount of       fluid collected was 10 mL. The fluid was clear, yellow and viscous.       Sample(s) were sent for glucose, amylase concentration, cytology and CEA.      Pancreatic parenchymal abnormalities were noted in the pancreatic head,       genu of the pancreas, pancreatic body and pancreatic tail. These       consisted of atrophy, lobularity with honeycombing and hyperechoic       strands. There was not ductal dilation noted throughout.      One irregularly shaped lesion was found in the periduodenal peritoneal       space. This lesion was heterogenous and distally enhancing. It is not       clear if this is a lymph node or connected to the region of the       periduodenal space. It measured 31 mm by 15 mm in maximal       cross-sectional diameter. Fine needle biopsy was performed. Color       Doppler imaging was utilized prior to needle puncture to confirm a lack       of significant vascular structures within the needle path. Five passes       were made with the 22 gauge Acquire biopsy needle using a transduodenal       approach. A visible core of tissue was obtained. A preliminary cytologic       examination was performed. Final cytology results are pending.      Anechoic lesions suggestive of a few cysts were identified in the       visualized portion of the liver. There was no associated mass-lesions       otherwise noted.  The celiac region was visualized. Impression:                EGD Impression:                           - No gross lesions in the entire esophagus. Z-line                            irregular, 41 cm from the incisors.                           - 1 cm hiatal hernia.                           - Gastritis. Biopsied.                           - J-shaped Deformity in the gastric body.                           - Congested duodenal mucosa throughout.                           EUS Impression:                           - There was dilation in the middle third of the                            main bile duct which measured up to 12 mm.                           - A cystic lesion was seen in the pancreatic head.                            Cytology results are pending. 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                            Fine needle aspiration for fluid performed.                           - Pancreatic parenchymal abnormalities consisting                            of atrophy, lobularity with honeycombing and                            hyperechoic strands were noted in the pancreatic  head, genu of the pancreas, pancreatic body and                            pancreatic tail.                           - One heterogenous, distally enhancing lesion was                            found in the periduodenal peritoneal cavity. Fine                            needle biopsy performed. This is not clear if this                            is a lymph node or other lesion.                           - A few cystic lesions were found in the visualized                            portion of the liver. No other concerning findings                            in the visualized liver. Recommendation:           - Proceed to scheduled attempt at ERCP.                           - Observe patient's clinical  course.                           - Await cytology results and await path results.                           - Recommend obtaining a CA19-9.                           - Observe patient's clinical course.                           - The findings and recommendations were discussed                            with the patient.                           - The findings and recommendations were discussed                            with the referring physician. Procedure Code(s):        --- Professional ---                           [phone], 59, Esophagogastroduodenoscopy, flexible,  transoral; with transendoscopic ultrasound-guided                            intramural or transmural fine needle                            aspiration/biopsy(s), (includes endoscopic                            ultrasound examination limited to the esophagus,                            stomach or duodenum, and adjacent structures)                           [id number], 59, Esophagogastroduodenoscopy, flexible,                            transoral; with biopsy, single or multiple Diagnosis Code(s):        --- Professional ---                           K22.89, Other specified disease of esophagus                           K44.9, Diaphragmatic hernia without obstruction or                            gangrene                           K29.70, Gastritis, unspecified, without bleeding                           K31.89, Other diseases of stomach and duodenum                           K86.2, Cyst of pancreas                           K86.9, Disease of pancreas, unspecified                           K76.89, Other specified diseases of liver                           R59.1, Generalized enlarged lymph nodes                           K83.1, Obstruction of bile duct                           R74.8, Abnormal levels of other serum enzymes                           K83.8, Other specified diseases of biliary tract  R93.5, Abnormal findings on diagnostic imaging of                            other abdominal regions, including retroperitoneum CPT copyright 2022 American [employer] Association. All rights reserved. The codes documented in this report are preliminary and upon coder review may  be revised to meet current compliance requirements. Justice Britain, MD 05/13/2022 6:11:58 PM Number of Addenda: 0

## 2022-05-13 NOTE — Anesthesia Procedure Notes (Signed)
Procedure Name: Intubation Date/Time: 05/13/2022 3:49 PM  Performed by: Barrington Ellison, CRNAPre-anesthesia Checklist: Patient identified, Emergency Drugs available, Suction available and Patient being monitored Patient Re-evaluated:Patient Re-evaluated prior to induction Oxygen Delivery Method: Circle System Utilized Preoxygenation: Pre-oxygenation with 100% oxygen Induction Type: IV induction Ventilation: Mask ventilation without difficulty and Two handed mask ventilation required Laryngoscope Size: Mac and 4 Grade View: Grade I Tube type: Oral Tube size: 7.5 mm Number of attempts: 1 Airway Equipment and Method: Stylet and Oral airway Placement Confirmation: ETT inserted through vocal cords under direct vision, positive ETCO2 and breath sounds checked- equal and bilateral Secured at: 22 cm Tube secured with: Tape Dental Injury: Teeth and Oropharynx as per pre-operative assessment

## 2022-05-13 NOTE — Progress Notes (Signed)
PROGRESS NOTE  Eddie Mendoza [id number] DOB: [birth year]   PCP: [facility]  Patient is from: Home  DOA: 05/11/2022 LOS: 1  Chief complaints Chief Complaint  Patient presents with   Shortness of Breath     Brief Narrative / Interim history: 72 year old M with PMH of BPH, TURBT in 2022, hematuria, anemia, HTN, EtOH and substance use and recent ED visit on 1/1 when he left AMA returning with jaundice, unintentional 20 pound weight loss and itching, and admitted for obstructive jaundice/hyperbilirubinemia, pancreatic cystic mass and bladder/prostate mass.  Seen in the ED on 03/21/2022 for abdominal pain and hematuria.  CT was concerning for bladder neoplasm, gallbladder/biliary neoplasm, cystic lesions in the pancreatic head, and metastatic disease.  Patient refused hospitalization and left AMA at that time.   In ED, stable vitals.  Hgb 8.2 (9.6 on 1/24).  BUN 18.  AST 50.  ALT 59.  Total bili 18.4.  Lipase and INR normal.  CT with contrast concerning for questionable acute pancreatitis, 2.9 cm proximal pancreatic mass with interval worsening of severe intra and extrahepatic biliary ductal dilation concerning for hepatobiliary or pancreatic metastatic malignancy, bladder/prostate mass, marked prostamegaly and heterogenicity of prostate gland, retroperitoneal and post Cabbell LAD and questionable diffuse colonic bowel thickening with no definite finding of colitis. ED PA discussed the case with Dr. Felipa Eth with urology who felt that the patient is currently not a surgical candidate due to his other medical issues.  He recommended outpatient follow-up with urology for biopsy of the bladder mass.  [facility] consulted.  Plan for ERCP today, 2/23.   Subjective: Seen and examined earlier this morning before he went down for ERCP.  No major events overnight of this morning.  No complaints.  He denies pain, nausea, vomiting, chest pain or shortness of breath.  Objective: Vitals:    05/12/22 2059 05/13/22 0420 05/13/22 0815 05/13/22 1324  BP: (!) 121/52 (!) 140/60 134/78 138/69  Pulse: 74 76 71 60  Resp: '17 18 18 17  '$ Temp: 97.6 F (36.4 C) 98 F (36.7 C) 97.8 F (36.6 C)   TempSrc: Oral  Oral   SpO2: 97% 100% 100%   Weight:    99.8 kg  Height:    '5\' 10"'$  (1.778 m)    Examination:  GENERAL: No apparent distress.  Nontoxic. HEENT: MMM.  Vision and hearing grossly intact.  NECK: Supple.  No apparent JVD.  RESP:  No IWOB.  Fair aeration bilaterally. CVS:  RRR. Heart sounds normal.  ABD/GI/GU: BS+. Abd soft, NTND.  MSK/EXT:   No apparent deformity. Moves extremities.  1+ BLE edema. SKIN: no apparent skin lesion or wound NEURO: Awake and alert. Oriented appropriately.  No apparent focal neuro deficit. PSYCH: Calm. Normal affect.   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 -GI on board.  Plan for EUS/ERCP today, 2/23 -Continue monitoring LFT/bilirubin.  Lipase within normal.   Hematuria/possible bladder/prostate mass concerning for malignancy: -Dr. Felipa Eth with urology recommended outpatient follow-up with urology for biopsy per ED PA and H&P -Continue home Flomax  and Proscar.  Acute on chronic blood loss anemia: Patient denies hematuria on my interview.  UA on 1/24 without hematuria. Recent Labs    03/21/22 1311 04/13/22 1410 05/11/22 1655 05/12/22 0628 05/13/22 0407  HGB 10.1* 9.6* 8.2* 8.1* 8.1*  -Continue monitoring   Mild  hyponatremia: Stable.  Continue monitoring -Monitor.  Hypokalemia: -IV KCl 10 x 4. -Add IV KCl to IV fluid as well   Normal anion gap metabolic acidosis -P.o. sodium bicarbonate once starting taking p.o.  BLE edema: Could be due to malignancy, hypoalbuminemia and anemia. -Encourage leg elevation -Try TED hose   Alcohol use disorder: denied alcohol for several weeks.  However, per ED documentation, his wife reported ongoing use. -Continue CIWA -Encourage alcohol cessation.   Essential hypertension: Normotensive off home antihypertensive meds. -Continue holding losartan and thiazide diuretics  Obesity: Body mass index is 31.57 kg/m.           DVT prophylaxis:  SCDs Start: 05/12/22 0204  Code Status: Full code Family Communication: None at bedside Level of care: Med-Surg Status is: Inpatient The patient will remain inpatient because: Obstructive jaundice, elevated LFT, pancreatic mass   Final disposition: Likely home once medically cleared Consultants:  Gastroenterology Urology  55 minutes with more than 50% spent in reviewing records, counseling patient/family and coordinating care.   Sch Meds:  Scheduled Meds:  [MAR Hold] folic acid  1 mg Oral Daily   [MAR Hold] multivitamin with minerals  1 tablet Oral Daily   [MAR Hold] thiamine  100 mg Oral Daily   Or   [MAR Hold] thiamine  100 mg Intravenous Daily   Continuous Infusions:  lactated ringers 1,000 mL with potassium chloride 40 mEq infusion 100 mL/hr at 05/13/22 0927   PRN Meds:.[MAR Hold] LORazepam **OR** [MAR Hold] LORazepam  Antimicrobials: Anti-infectives (From admission, onward)    None        I have personally reviewed the following labs and images: CBC: Recent Labs  Lab 05/11/22 1655 05/12/22 0628 05/13/22 0407  WBC 12.5* 12.1* 10.2  NEUTROABS 10.1*  --   --   HGB 8.2* 8.1* 8.1*  HCT 23.1* 21.9* 22.3*  MCV 87.8 86.2 87.8  PLT 479* 446* 481*   BMP &GFR Recent Labs  Lab  05/11/22 1655 05/12/22 0628 05/13/22 0407  NA 133* 135 134*  K 3.5 3.6 3.2*  CL 107 109 109  CO2 17* 18* 17*  GLUCOSE 122* 97 108*  BUN '15 18 16  '$ CREATININE 0.87 0.90 0.90  CALCIUM 8.0* 8.2* 8.2*  MG  --   --  2.4  PHOS  --   --  3.6   Estimated Creatinine Clearance: 87.8 mL/min (by C-G formula based on SCr of 0.9 mg/dL). Liver & Pancreas: Recent Labs  Lab 05/11/22 1655 05/12/22 0628 05/13/22 0407  AST 50* 55* 52*  ALT 59* 61* 57*  ALKPHOS 380* 374* 360*  BILITOT 18.4* 18.5* 17.4*  PROT 6.0* 6.0* 5.7*  ALBUMIN 1.8* 1.7* 1.6*   Recent Labs  Lab 05/11/22 1655 05/12/22 0628  LIPASE 41 33   No results for input(s): "AMMONIA" in the last 168 hours. Diabetic: No results for input(s): "HGBA1C" in the last 72 hours. No results for input(s): "GLUCAP" in the last 168 hours. Cardiac Enzymes: No results for input(s): "CKTOTAL", "CKMB", "CKMBINDEX", "TROPONINI" in the last 168 hours. No results for input(s): "PROBNP" in the last 8760 hours. Coagulation Profile: Recent Labs  Lab 05/12/22 0015 05/12/22 0628  INR 1.0 1.0   Thyroid Function  Tests: No results for input(s): "TSH", "T4TOTAL", "FREET4", "T3FREE", "THYROIDAB" in the last 72 hours. Lipid Profile: No results for input(s): "CHOL", "HDL", "LDLCALC", "TRIG", "CHOLHDL", "LDLDIRECT" in the last 72 hours. Anemia Panel: Recent Labs    05/13/22 0407  VITAMINB12 527  FOLATE 12.2  FERRITIN 75  TIBC 353  IRON 78  RETICCTPCT 3.6*   Urine analysis:    Component Value Date/Time   COLORURINE YELLOW 04/13/2022 1353   APPEARANCEUR CLEAR 04/13/2022 1353   LABSPEC 1.017 04/13/2022 1353   PHURINE 5.0 04/13/2022 1353   GLUCOSEU NEGATIVE 04/13/2022 1353   HGBUR NEGATIVE 04/13/2022 1353   BILIRUBINUR SMALL (A) 04/13/2022 1353   KETONESUR NEGATIVE 04/13/2022 1353   PROTEINUR 30 (A) 04/13/2022 1353   UROBILINOGEN 0.2 08/15/2009 1010   NITRITE NEGATIVE 04/13/2022 1353   LEUKOCYTESUR TRACE (A) 04/13/2022 1353   Sepsis  Labs: Invalid input(s): "PROCALCITONIN", "LACTICIDVEN"  Microbiology: No results found for this or any previous visit (from the past 240 hour(s)).  Radiology Studies: No results found.    Ileen Kahre T. [place]  If 7PM-7AM, please contact night-coverage [email or web address] 05/13/2022, 1:45 PM

## 2022-05-13 NOTE — Social Work (Signed)
CSW noting consult for substance use counseling and resources. CSW met with pt at bedside. Pt sleeping deeply and did not awake to his name being called. CSW to attempt again as able. TOC will continue to follow.

## 2022-05-13 NOTE — Interval H&P Note (Signed)
History and Physical Interval Note:  05/13/2022 3:38 PM  Eddie Mendoza  has presented today for surgery, with the diagnosis of Hyperbilirubinemia.  The various methods of treatment have been discussed with the patient and family. After consideration of risks, benefits and other options for treatment, the patient has consented to  Procedure(s): FULL UPPER ENDOSCOPIC ULTRASOUND (EUS) RADIAL (N/A) ENDOSCOPIC RETROGRADE CHOLANGIOPANCREATOGRAPHY (ERCP) (N/A) as a surgical intervention.  The patient's history has been reviewed, patient examined, no change in status, stable for surgery.  I have reviewed the patient's chart and labs.  Questions were answered to the patient's satisfaction.      The risks of an EUS including intestinal perforation, bleeding, infection, aspiration, and medication effects were discussed as was the possibility it may not give a definitive diagnosis if a biopsy is performed.  When a biopsy of the pancreas is done as part of the EUS, there is an additional risk of pancreatitis at the rate of about 1-2%.  It was explained that procedure related pancreatitis is typically mild, although it can be severe and even life threatening, which is why we do not perform random pancreatic biopsies and only biopsy a lesion/area we feel is concerning enough to warrant the risk.   The risks of an ERCP were discussed at length, including but not limited to the risk of perforation, bleeding, abdominal pain, post-ERCP pancreatitis (while usually mild can be severe and even life threatening).    [employer]

## 2022-05-13 NOTE — Progress Notes (Signed)
   05/13/22 1000  Mobility  Activity Ambulated with assistance in hallway  Level of Assistance Contact guard assist, steadying assist  Assistive Device None  Distance Ambulated (ft) 200 ft  Activity Response Tolerated well  Mobility Referral Yes  $Mobility charge 1 Mobility   Mobility Specialist Progress Note  Pt was in bed and agreeable. Had a very unsteady gait but had no c/o pain. Returned to chair w/ alarm on and call bell in reach.   Lucious Groves Mobility Specialist  Please contact via SecureChat or Rehab office at [phone]

## 2022-05-13 NOTE — Transfer of Care (Signed)
Immediate Anesthesia Transfer of Care Note  Patient: Eddie Mendoza  Procedure(s) Performed: FULL UPPER ENDOSCOPIC ULTRASOUND (EUS) RADIAL ENDOSCOPIC RETROGRADE CHOLANGIOPANCREATOGRAPHY (ERCP) BIOPSY FINE NEEDLE ASPIRATION (FNA) LINEAR ESOPHAGOGASTRODUODENOSCOPY (EGD) SPHINCTEROTOMY PANCREATIC STENT PLACEMENT REMOVAL OF STONES BILIARY BRUSHING BILIARY STENT PLACEMENT  Patient Location: PACU  Anesthesia Type:General  Level of Consciousness: lethargic  Airway & Oxygen Therapy: Patient Spontanous Breathing  Post-op Assessment: Report given to RN  Post vital signs: Reviewed and stable  Last Vitals:  Vitals Value Taken Time  BP 144/74 05/13/22 1817  Temp    Pulse 78 05/13/22 1819  Resp 14 05/13/22 1819  SpO2 95 % 05/13/22 1819  Vitals shown include unvalidated device data.  Last Pain:  Vitals:   05/13/22 0815  TempSrc: Oral  PainSc: 0-No pain         Complications: No notable events documented.

## 2022-05-14 DIAGNOSIS — D649 Anemia, unspecified: Secondary | ICD-10-CM

## 2022-05-14 DIAGNOSIS — R748 Abnormal levels of other serum enzymes: Secondary | ICD-10-CM | POA: Diagnosis not present

## 2022-05-14 DIAGNOSIS — K869 Disease of pancreas, unspecified: Secondary | ICD-10-CM

## 2022-05-14 LAB — COMPREHENSIVE METABOLIC PANEL
ALT: 53 U/L — ABNORMAL HIGH (ref 0–44)
AST: 53 U/L — ABNORMAL HIGH (ref 15–41)
Albumin: 1.5 g/dL — ABNORMAL LOW (ref 3.5–5.0)
Alkaline Phosphatase: 350 U/L — ABNORMAL HIGH (ref 38–126)
Anion gap: 8 (ref 5–15)
BUN: 21 mg/dL (ref 8–23)
CO2: 19 mmol/L — ABNORMAL LOW (ref 22–32)
Calcium: 8.2 mg/dL — ABNORMAL LOW (ref 8.9–10.3)
Chloride: 109 mmol/L (ref 98–111)
Creatinine, Ser: 1.08 mg/dL (ref 0.61–1.24)
GFR, Estimated: >60 mL/min (ref >60)
Glucose, Bld: 114 mg/dL — ABNORMAL HIGH (ref 70–99)
Potassium: 3.4 mmol/L — ABNORMAL LOW (ref 3.5–5.1)
Sodium: 136 mmol/L (ref 135–145)
Total Bilirubin: 17.4 mg/dL — ABNORMAL HIGH (ref 0.3–1.2)
Total Protein: 5.5 g/dL — ABNORMAL LOW (ref 6.5–8.1)

## 2022-05-14 LAB — AMMONIA: Ammonia: 29 umol/L (ref 9–35)

## 2022-05-14 LAB — CBC
HCT: 22.1 % — ABNORMAL LOW (ref 39.0–52.0)
Hemoglobin: 8.1 g/dL — ABNORMAL LOW (ref 13.0–17.0)
MCH: 32.1 pg (ref 26.0–34.0)
MCHC: 36.7 g/dL — ABNORMAL HIGH (ref 30.0–36.0)
MCV: 87.7 fL (ref 80.0–100.0)
Platelets: 426 10*3/uL — ABNORMAL HIGH (ref 150–400)
RBC: 2.52 MIL/uL — ABNORMAL LOW (ref 4.22–5.81)
RDW: 24.8 % — ABNORMAL HIGH (ref 11.5–15.5)
WBC: 10.6 10*3/uL — ABNORMAL HIGH (ref 4.0–10.5)
nRBC: 0 % (ref 0.0–0.2)

## 2022-05-14 LAB — MAGNESIUM: Magnesium: 2.5 mg/dL — ABNORMAL HIGH (ref 1.7–2.4)

## 2022-05-14 LAB — LIPASE, BLOOD: Lipase: 44 U/L (ref 11–51)

## 2022-05-14 LAB — PHOSPHORUS: Phosphorus: 5.4 mg/dL — ABNORMAL HIGH (ref 2.5–4.6)

## 2022-05-14 MED ORDER — ALBUTEROL SULFATE (2.5 MG/3ML) 0.083% IN NEBU
2.5000 mg | INHALATION_SOLUTION | RESPIRATORY_TRACT | Status: DC | PRN
  Administered 2022-05-16: 2.5 mg via RESPIRATORY_TRACT
  Filled 2022-05-14: qty 3

## 2022-05-14 MED ORDER — HALOPERIDOL LACTATE 5 MG/ML IJ SOLN
1.0000 mg | Freq: Four times a day (QID) | INTRAMUSCULAR | Status: DC | PRN
  Administered 2022-05-14 – 2022-05-15 (×2): 1 mg via INTRAVENOUS
  Filled 2022-05-14 (×3): qty 1

## 2022-05-14 MED ORDER — POTASSIUM CHLORIDE CRYS ER 20 MEQ PO TBCR
40.0000 meq | EXTENDED_RELEASE_TABLET | Freq: Once | ORAL | Status: AC
  Administered 2022-05-14: 40 meq via ORAL
  Filled 2022-05-14: qty 2

## 2022-05-14 MED ORDER — FINASTERIDE 5 MG PO TABS
5.0000 mg | ORAL_TABLET | Freq: Every day | ORAL | Status: DC
  Administered 2022-05-14 – 2022-05-17 (×4): 5 mg via ORAL
  Filled 2022-05-14 (×4): qty 1

## 2022-05-14 MED ORDER — TAMSULOSIN HCL 0.4 MG PO CAPS
0.4000 mg | ORAL_CAPSULE | Freq: Every day | ORAL | Status: DC
  Administered 2022-05-14 – 2022-05-17 (×4): 0.4 mg via ORAL
  Filled 2022-05-14 (×4): qty 1

## 2022-05-14 NOTE — Progress Notes (Signed)
Mobility Specialist - Progress Note   05/14/22 1400  Mobility  Activity Ambulated with assistance in hallway  Level of Assistance Minimal assist, patient does 75% or more  Assistive Device Front wheel walker  Distance Ambulated (ft) 200 ft  Activity Response Tolerated well  Mobility Referral Yes  $Mobility charge 1 Mobility    Pt received standing EOB agreeable to mobility. MinA required to correct LOB x2. No complaints on walk. Left in bed w/ bed alarm on.   [place] Specialist Please contact via SecureChat or Rehab office at [phone]

## 2022-05-14 NOTE — Progress Notes (Addendum)
Progress Note   Subjective  Chief Complaint: Elevated LFTs, metastatic bladder cancer  Status post ERCP 05/13/22 (see op note for full details) but difficult cannulation requiring double wire technique, prophylactic pancreatic stent placed, single severe biliary stricture was found to milligray the main bile duct which was malignant appearing, brushed for cytology also stented  Patient was found sleeping, he is somewhat hard to understand.  His wife is at bedside and tells me that he has been confused this morning which is unlike him.  She was unable to figure out he was found to tell her earlier.  Tells me now that his face is numb.  She tells me or he has had that pain for a while.  Denies abdominal pain.    Objective   Vital signs in last 24 hours: Temp:  [97.5 F (36.4 C)-98.6 F (37 C)] 97.7 F (36.5 C) (02/24 0656) Pulse Rate:  [60-85] 62 (02/24 0805) Resp:  [14-18] 16 (02/24 0805) BP: (95-160)/(52-77) 123/61 (02/24 0805) SpO2:  [95 %-100 %] 100 % (02/24 0805) Weight:  [99.8 kg] 99.8 kg (02/23 1324) Last BM Date : 05/13/22 General:    Ill-appearing,  AA male in NAD Heart:  Regular rate and rhythm; no murmurs Lungs: Respirations even and unlabored, lungs CTA bilaterally Abdomen: Slightly tense nontender and mild distention. Normal bowel sounds. Psych: Confused, mumbling  Intake/Output from previous day: 02/23 0701 - 02/24 0700 In: 1960.4 [P.O.:480; I.V.:1167.9; IV Piggyback:312.5] Out: 1101 [Urine:1100; Blood:1]   Lab Results: Recent Labs    05/12/22 0628 05/13/22 0407 05/14/22 0303  WBC 12.1* 10.2 10.6*  HGB 8.1* 8.1* 8.1*  HCT 21.9* 22.3* 22.1*  PLT 446* 481* 426*   BMET Recent Labs    05/12/22 0628 05/13/22 0407 05/14/22 0303  NA 135 134* 136  K 3.6 3.2* 3.4*  CL 109 109 109  CO2 18* 17* 19*  GLUCOSE 97 108* 114*  BUN '18 16 21  '$ CREATININE 0.90 0.90 1.08  CALCIUM 8.2* 8.2* 8.2*      Latest Ref Rng & Units 05/14/2022    3:03 AM 05/13/2022     4:07 AM 05/12/2022    6:28 AM  Hepatic Function  Total Protein 6.5 - 8.1 g/dL 5.5  5.7  6.0   Albumin 3.5 - 5.0 g/dL 1.5  1.6  1.7   AST 15 - 41 U/L 53  52  55   ALT 0 - 44 U/L 53  57  61   Alk Phosphatase 38 - 126 U/L 350  360  374   Total Bilirubin 0.3 - 1.2 mg/dL 17.4  17.4  18.5      PT/INR Recent Labs    05/12/22 0015 05/12/22 0628  LABPROT 13.5 13.3  INR 1.0 1.0    Studies/Results: DG ERCP  Result Date: 05/14/2022 CLINICAL DATA:  72 year old male with history of biliary obstruction. EXAM: ERCP TECHNIQUE: Multiple spot images obtained with the fluoroscopic device and submitted for interpretation post-procedure. FLUOROSCOPY TIME:  81.7 mGy COMPARISON:  CT abdomen pelvis from 05/11/2022 FINDINGS: Retrograde cannulation of the common bile duct into the intrahepatic ducts and cholangiogram demonstrates severe intrahepatic biliary duct dilation with obstructive hilar mass. A plastic biliary stent is placed. IMPRESSION: 1. Severe biliary obstruction at the level of the hilum. 2. Common bile duct stent was placed. These images were submitted for radiologic interpretation only. Please see the procedural report for the full procedural details, amount of contrast, and the fluoroscopy time utilized. Ruthann Cancer, MD [facility] Radiology Electronically Signed   By: Ruthann Cancer M.D.   On: 05/14/2022 08:06       Assessment / Plan:   Assessment: 1.  Elevated LFTs: Again CT concerning for hepatobiliary/pancreatic metastatic malignancy, patient initially presented complaining of jaundice, T. bili elevated 18.4--> 17.4 overnight, LFTs minimally decreased overnight, status post ERCP with stent placement in the pancreatic and CBD 2/23, continues to complain of itching, confusion this morning 2.  Hematuria 4.  Alcohol use disorder 5.  Abnormal CT of the abdomen: With likely bladder mass and mets/lymphadenopathy  Plan: 1.  Added an ammonia level  given patient's confusion this morning 2.  Continue to monitor LFTs 3.  Okay for soft diet now and increase as tolerated 4.  Again continue antibiotics for at least the next 3 days 5.  Cytology results are pending  Thank you for your kind consultation, we will continue to follow.   LOS: 2 days   [name]  05/14/2022, 11:19 AM

## 2022-05-14 NOTE — Anesthesia Postprocedure Evaluation (Signed)
Anesthesia Post Note  Patient: Eddie Mendoza  Procedure(s) Performed: FULL UPPER ENDOSCOPIC ULTRASOUND (EUS) RADIAL ENDOSCOPIC RETROGRADE CHOLANGIOPANCREATOGRAPHY (ERCP) BIOPSY FINE NEEDLE ASPIRATION (FNA) LINEAR ESOPHAGOGASTRODUODENOSCOPY (EGD) SPHINCTEROTOMY PANCREATIC STENT PLACEMENT REMOVAL OF STONES BILIARY BRUSHING BILIARY STENT PLACEMENT     Patient location during evaluation: PACU Anesthesia Type: General Level of consciousness: awake and alert Pain management: pain level controlled Vital Signs Assessment: post-procedure vital signs reviewed and stable Respiratory status: spontaneous breathing, nonlabored ventilation, respiratory function stable and patient connected to nasal cannula oxygen Cardiovascular status: blood pressure returned to baseline and stable Postop Assessment: no apparent nausea or vomiting Anesthetic complications: no  No notable events documented.  Last Vitals:  Vitals:   05/13/22 2311 05/14/22 0324  BP: 125/77 (!) 95/52  Pulse: 69 61  Resp: 17 18  Temp: 37 C (!) 36.4 C  SpO2: 100% 100%    Last Pain:  Vitals:   05/14/22 0324  TempSrc: Oral  PainSc:                  Effie Berkshire

## 2022-05-14 NOTE — Progress Notes (Signed)
PROGRESS NOTE  Eddie Mendoza [id number] DOB: 08-30-1950   PCP: [facility]  Patient is from: Home  DOA: 05/11/2022 LOS: 2  Brief Narrative / Interim history: 72 year old M with PMH of BPH, TURBT in 2022, hematuria, anemia, HTN, EtOH and substance use and recent ED visit on 1/1 when he left AMA returning with jaundice, unintentional 20 pound weight loss and itching, and admitted for obstructive jaundice/hyperbilirubinemia, pancreatic cystic mass and bladder/prostate mass.  Seen in the ED on 03/21/2022 for abdominal pain and hematuria.  CT was concerning for bladder neoplasm, gallbladder/biliary neoplasm, cystic lesions in the pancreatic head, and metastatic disease.  Patient refused hospitalization and left AMA at that time.   In ED, stable vitals.  Hgb 8.2 (9.6 on 1/24).  BUN 18.  AST 50.  ALT 59.  Total bili 18.4.  Lipase and INR normal.  CT with contrast concerning for questionable acute pancreatitis, 2.9 cm proximal pancreatic mass with interval worsening of severe intra and extrahepatic biliary ductal dilation concerning for hepatobiliary or pancreatic metastatic malignancy, bladder/prostate mass, marked prostamegaly and heterogenicity of prostate gland, retroperitoneal and post Cabbell LAD and questionable diffuse colonic bowel thickening with no definite finding of colitis. ED PA discussed the case with Dr. Felipa Eth with urology who felt that the patient is currently not a surgical candidate due to his other medical issues.  He recommended outpatient follow-up with urology for biopsy of the bladder mass.  [facility] consulted.  Patient underwent EUS and ERCP on 2/23.   Subjective: Patient is somnolent in this morning.  Denies any abdominal pain nausea or vomiting.  Objective: Vitals:   05/13/22 2311 05/14/22 0324 05/14/22 0656 05/14/22 0805  BP: 125/77 (!) 95/52 123/64 123/61  Pulse: 69 61 85 62  Resp: '17 18 18 16  '$ Temp: 98.6 F (37 C) (!) 97.5 F (36.4 C) 97.7 F  (36.5 C)   TempSrc:  Oral Oral   SpO2: 100% 100% 100% 100%  Weight:      Height:        Examination:  General appearance: Sleepy but easily arousable.  In no distress Jaundiced Resp: Clear to auscultation bilaterally.  Normal effort Cardio: S1-S2 is normal regular.  No S3-S4.  No rubs murmurs or bruit GI: Abdomen is soft.  Nontender nondistended.  Bowel sounds are present normal.  No masses organomegaly Extremities: No edema.   Neurologic: No focal neurological deficits.    Procedures:  EUS 2/23 Impression:               EGD Impression:                           - No gross lesions in the entire esophagus. Z-line                            irregular, 41 cm from the incisors.                           - 1 cm hiatal hernia.                           - Gastritis. Biopsied.                           - J-shaped Deformity in the gastric body.                           -  Congested duodenal mucosa throughout.                           EUS Impression:                           - There was dilation in the middle third of the                            main bile duct which measured up to 12 mm.                           - A cystic lesion was seen in the pancreatic head.                            Cytology results are pending. 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                            Fine needle aspiration for fluid performed.                           - Pancreatic parenchymal abnormalities consisting                            of atrophy, lobularity with honeycombing and                            hyperechoic strands were noted in the pancreatic                            head, genu of the pancreas, pancreatic body and                            pancreatic tail.                           - One heterogenous, distally enhancing lesion was                             found in the periduodenal peritoneal cavity. Fine                            needle biopsy performed. This is not clear if this                            is a lymph node or other lesion.                           - A few cystic  lesions were found in the visualized                            portion of the liver. No other concerning findings                            in the visualized liver.  ERCP 2/23 Impression:               - The major papilla appeared congested and small.                           - Very difficult cannulation requiring double wire                            technique.                           - The patient has a shared portion of the distal                            duct such that there was both pancreatic and                            biliary injection noted during initial cannulation.                           - Prophylactic pancreatic stent placed.                           - The biliary tree was swept and sludge was found.                           - A single severe biliary stricture was found in                            the middle third of the main bile duct. The                            stricture was malignant appearing. It was brushed x                            2. This stricture was then stented with one plastic                            stent.   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  Underwent EUS/ERCP yesterday.  Fine-needle aspiration of the cystic lesion in the pancreatic head was performed.  Pancreatic parenchymal abnormalities were noted on EUS.  Distally enhancing lesion was found in the periduodenal peritoneal cavity.  A biopsy was performed.  Biliary sludge was found.  Single severe biliary stricture was noted in the main bile duct.  It was  brushed.  And then the stent was placed. LFTs remain abnormal. Await  further GI input. Lipase level noted to be normal. GI note mentioned antibiotics. Clarified with Dr. Tarri Glenn. No need for antibiotics currently.  Hematuria/possible bladder/prostate mass concerning for malignancy: -Dr. Felipa Eth with urology recommended outpatient follow-up with urology for biopsy per ED PA and H&P -Continue home Flomax and Proscar.  Acute on chronic blood loss anemia Hemoglobin low but stable.  Mild hyponatremia Stable.  Continue monitoring  Hypokalemia: Will be repleted.  Magnesium 2.5.   Normal anion gap metabolic acidosis Bicarbonate level noted to be better today.  Recheck labs tomorrow.  BLE edema Could be due to malignancy, hypoalbuminemia and anemia. -Encourage leg elevation -Try TED hose Will order lower extremity Doppler studies.   Alcohol use disorder Denied alcohol for several weeks.  However, per ED documentation, his wife reported ongoing use. Continue CIWA. Encourage alcohol cessation.   Essential hypertension Normotensive off home antihypertensive meds. -Continue holding losartan and thiazide diuretics  Obesity: Estimated body mass index is 31.57 kg/m as calculated from the following:   Height as of this encounter: '5\' 10"'$  (1.778 m).   Weight as of this encounter: 99.8 kg.   DVT prophylaxis: SCDs Code Status: Full code Family Communication: None at bedside Disposition: Hopefully return home when improved  Status is: Inpatient Remains inpatient appropriate because: Abnormal LFTs, pancreatic mass, biliary stricture  Consultants:  Gastroenterology Urology    Sch Meds:  Scheduled Meds:  folic acid  1 mg Oral Daily   multivitamin with minerals  1 tablet Oral Daily   thiamine  100 mg Oral Daily   Or   thiamine  100 mg Intravenous Daily   Continuous Infusions:   PRN Meds:.LORazepam **OR** LORazepam  Antimicrobials: Anti-infectives (From admission, onward)     None         CBC: Recent Labs  Lab 05/11/22 1655 05/12/22 0628 05/13/22 0407 05/14/22 0303  WBC 12.5* 12.1* 10.2 10.6*  NEUTROABS 10.1*  --   --   --   HGB 8.2* 8.1* 8.1* 8.1*  HCT 23.1* 21.9* 22.3* 22.1*  MCV 87.8 86.2 87.8 87.7  PLT 479* 446* 481* 426*    BMP &GFR Recent Labs  Lab 05/11/22 1655 05/12/22 0628 05/13/22 0407 05/14/22 0303  NA 133* 135 134* 136  K 3.5 3.6 3.2* 3.4*  CL 107 109 109 109  CO2 17* 18* 17* 19*  GLUCOSE 122* 97 108* 114*  BUN '15 18 16 21  '$ CREATININE 0.87 0.90 0.90 1.08  CALCIUM 8.0* 8.2* 8.2* 8.2*  MG  --   --  2.4 2.5*  PHOS  --   --  3.6 5.4*    Estimated Creatinine Clearance: 73.2 mL/min (by C-G formula based on SCr of 1.08 mg/dL).  Liver & Pancreas: Recent Labs  Lab 05/11/22 1655 05/12/22 0628 05/13/22 0407 05/14/22 0303  AST 50* 55* 52* 53*  ALT 59* 61* 57* 53*  ALKPHOS 380* 374* 360* 350*  BILITOT 18.4* 18.5* 17.4* 17.4*  PROT 6.0* 6.0* 5.7* 5.5*  ALBUMIN 1.8* 1.7* 1.6* 1.5*    Recent Labs  Lab 05/11/22 1655 05/12/22 0628 05/14/22 0303  LIPASE 41 33 44     Coagulation Profile: Recent Labs  Lab 05/12/22 0015 05/12/22 0628  INR 1.0 1.0     Anemia Panel: Recent Labs    05/13/22 0407  VITAMINB12 527  FOLATE 12.2  FERRITIN 75  TIBC 353  IRON 78  RETICCTPCT 3.6*     Radiology Studies: DG ERCP  Result Date: 05/14/2022 CLINICAL DATA:  72 year old male with history of  biliary obstruction. EXAM: ERCP TECHNIQUE: Multiple spot images obtained with the fluoroscopic device and submitted for interpretation post-procedure. FLUOROSCOPY TIME:  81.7 mGy COMPARISON:  CT abdomen pelvis from 05/11/2022 FINDINGS: Retrograde cannulation of the common bile duct into the intrahepatic ducts and cholangiogram demonstrates severe intrahepatic biliary duct dilation with obstructive hilar mass. A plastic biliary stent is placed. IMPRESSION: 1. Severe biliary obstruction at the level of the hilum. 2. Common bile duct  stent was placed. These images were submitted for radiologic interpretation only. Please see the procedural report for the full procedural details, amount of contrast, and the fluoroscopy time utilized. Ruthann Cancer, MD [facility] Electronically Signed   By: Ruthann Cancer M.D.   On: 05/14/2022 08:06    [place] [facility]  If 7PM-7AM, please contact night-coverage [email or web address] 05/14/2022, 9:26 AM

## 2022-05-15 ENCOUNTER — Inpatient Hospital Stay (HOSPITAL_COMMUNITY): Payer: No Typology Code available for payment source

## 2022-05-15 DIAGNOSIS — K869 Disease of pancreas, unspecified: Secondary | ICD-10-CM | POA: Diagnosis not present

## 2022-05-15 DIAGNOSIS — K831 Obstruction of bile duct: Secondary | ICD-10-CM | POA: Diagnosis not present

## 2022-05-15 DIAGNOSIS — C801 Malignant (primary) neoplasm, unspecified: Secondary | ICD-10-CM

## 2022-05-15 DIAGNOSIS — R748 Abnormal levels of other serum enzymes: Secondary | ICD-10-CM | POA: Diagnosis not present

## 2022-05-15 DIAGNOSIS — D649 Anemia, unspecified: Secondary | ICD-10-CM | POA: Diagnosis not present

## 2022-05-15 DIAGNOSIS — D72828 Other elevated white blood cell count: Secondary | ICD-10-CM

## 2022-05-15 DIAGNOSIS — D72829 Elevated white blood cell count, unspecified: Secondary | ICD-10-CM

## 2022-05-15 LAB — CBC
HCT: 22.7 % — ABNORMAL LOW (ref 39.0–52.0)
Hemoglobin: 8.4 g/dL — ABNORMAL LOW (ref 13.0–17.0)
MCH: 31.6 pg (ref 26.0–34.0)
MCHC: 37 g/dL — ABNORMAL HIGH (ref 30.0–36.0)
MCV: 85.3 fL (ref 80.0–100.0)
Platelets: 427 10*3/uL — ABNORMAL HIGH (ref 150–400)
RBC: 2.66 MIL/uL — ABNORMAL LOW (ref 4.22–5.81)
RDW: 24.6 % — ABNORMAL HIGH (ref 11.5–15.5)
WBC: 22.5 10*3/uL — ABNORMAL HIGH (ref 4.0–10.5)
nRBC: 0 % (ref 0.0–0.2)

## 2022-05-15 LAB — COMPREHENSIVE METABOLIC PANEL
ALT: 62 U/L — ABNORMAL HIGH (ref 0–44)
AST: 75 U/L — ABNORMAL HIGH (ref 15–41)
Albumin: 1.5 g/dL — ABNORMAL LOW (ref 3.5–5.0)
Alkaline Phosphatase: 370 U/L — ABNORMAL HIGH (ref 38–126)
Anion gap: 12 (ref 5–15)
BUN: 15 mg/dL (ref 8–23)
CO2: 16 mmol/L — ABNORMAL LOW (ref 22–32)
Calcium: 8.1 mg/dL — ABNORMAL LOW (ref 8.9–10.3)
Chloride: 108 mmol/L (ref 98–111)
Creatinine, Ser: 0.8 mg/dL (ref 0.61–1.24)
GFR, Estimated: >60 mL/min (ref >60)
Glucose, Bld: 92 mg/dL (ref 70–99)
Potassium: 3.9 mmol/L (ref 3.5–5.1)
Sodium: 136 mmol/L (ref 135–145)
Total Bilirubin: 19.2 mg/dL (ref 0.3–1.2)
Total Protein: 5.4 g/dL — ABNORMAL LOW (ref 6.5–8.1)

## 2022-05-15 LAB — HEPATITIS PANEL, ACUTE
HCV Ab: NONREACTIVE
Hep A IgM: NONREACTIVE
Hep B C IgM: NONREACTIVE
Hepatitis B Surface Ag: NONREACTIVE

## 2022-05-15 MED ORDER — GUAIFENESIN ER 600 MG PO TB12
600.0000 mg | ORAL_TABLET | Freq: Two times a day (BID) | ORAL | Status: DC
  Administered 2022-05-15 – 2022-05-17 (×5): 600 mg via ORAL
  Filled 2022-05-15 (×5): qty 1

## 2022-05-15 MED ORDER — IOHEXOL 9 MG/ML PO SOLN
500.0000 mL | ORAL | Status: AC
  Administered 2022-05-15 (×2): 500 mL via ORAL

## 2022-05-15 MED ORDER — SODIUM BICARBONATE 650 MG PO TABS
650.0000 mg | ORAL_TABLET | Freq: Two times a day (BID) | ORAL | Status: DC
  Administered 2022-05-15 – 2022-05-17 (×5): 650 mg via ORAL
  Filled 2022-05-15 (×5): qty 1

## 2022-05-15 NOTE — Progress Notes (Addendum)
PROGRESS NOTE  Eddie Mendoza [id number] DOB: [birth year]   [facility]P: [facility]  Patient is from: Home  DOA: 05/11/2022 LOS: 3  Brief Narrative / Interim history: 72 year old M with PMH of BPH, TURBT in 2022, hematuria, anemia, HTN, EtOH and substance use and recent ED visit on 1/1 when he left AMA returning with jaundice, unintentional 20 pound weight loss and itching, and admitted for obstructive jaundice/hyperbilirubinemia, pancreatic cystic mass and bladder/prostate mass.  Seen in the ED on 03/21/2022 for abdominal pain and hematuria.  CT was concerning for bladder neoplasm, gallbladder/biliary neoplasm, cystic lesions in the pancreatic head, and metastatic disease.  Patient refused hospitalization and left AMA at that time.   In ED, stable vitals.  Hgb 8.2 (9.6 on 1/24).  BUN 18.  AST 50.  ALT 59.  Total bili 18.4.  Lipase and INR normal.  CT with contrast concerning for questionable acute pancreatitis, 2.9 cm proximal pancreatic mass with interval worsening of severe intra and extrahepatic biliary ductal dilation concerning for hepatobiliary or pancreatic metastatic malignancy, bladder/prostate mass, marked prostamegaly and heterogenicity of prostate gland, retroperitoneal and post Cabbell LAD and questionable diffuse colonic bowel thickening with no definite finding of colitis. ED PA discussed the case with Dr. Felipa Eth with urology who felt that the patient is currently not a surgical candidate due to his other medical issues.  He recommended outpatient follow-up with urology for biopsy of the bladder mass.   GI consulted.  Patient underwent EUS and ERCP on 2/23.   Subjective: Yesterday's events noted.  Patient was noted to be confused and lethargic yesterday evening.  Had difficulty voiding.  Looks like he was subsequently able to void on his own.  Remains distracted this morning.    Objective: Vitals:   05/14/22 0656 05/14/22 0805 05/14/22 1530 05/15/22 0506  BP:  123/64 123/61 122/63 (!) 150/67  Pulse: 85 62 74 83  Resp: '18 16 17 17  '$ Temp: 97.7 F (36.5 C)  98 F (36.7 C) 98.2 F (36.8 C)  TempSrc: Oral  Oral Oral  SpO2: 100% 100% 100% 100%  Weight:      Height:        Examination:  General appearance: Awake alert.  In no distress.  Jaundice.  Distracted Resp: Clear to auscultation bilaterally.  Normal effort Cardio: S1-S2 is normal regular.  No S3-S4.  No rubs murmurs or bruit GI: Abdomen is soft.  Nontender nondistended.  Bowel sounds are present normal.  No masses organomegaly Extremities: No edema.  Moving all of his extremities.  Deconditioning is noted. Neurologic: Oriented to place city year.  Did not know the month.  Annual nerves II to XII intact.  Motor strength equal bilateral upper and lower extremities.  Gait not assessed.   Procedures:  EUS 2/23 Impression:               EGD Impression:                           - No gross lesions in the entire esophagus. Z-line                            irregular, 41 cm from the incisors.                           - 1 cm hiatal hernia.                           -  Gastritis. Biopsied.                           - J-shaped Deformity in the gastric body.                           - Congested duodenal mucosa throughout.                           EUS Impression:                           - There was dilation in the middle third of the                            main bile duct which measured up to 12 mm.                           - A cystic lesion was seen in the pancreatic head.                            Cytology results are pending. 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                            Fine needle aspiration for fluid performed.                           - Pancreatic parenchymal abnormalities consisting                            of atrophy, lobularity with  honeycombing and                            hyperechoic strands were noted in the pancreatic                            head, genu of the pancreas, pancreatic body and                            pancreatic tail.                           - One heterogenous, distally enhancing lesion was                            found in the periduodenal peritoneal cavity. Fine                            needle biopsy performed. This is not clear if this  is a lymph node or other lesion.                           - A few cystic lesions were found in the visualized                            portion of the liver. No other concerning findings                            in the visualized liver.  ERCP 2/23 Impression:               - The major papilla appeared congested and small.                           - Very difficult cannulation requiring double wire                            technique.                           - The patient has a shared portion of the distal                            duct such that there was both pancreatic and                            biliary injection noted during initial cannulation.                           - Prophylactic pancreatic stent placed.                           - The biliary tree was swept and sludge was found.                           - A single severe biliary stricture was found in                            the middle third of the main bile duct. The                            stricture was malignant appearing. It was brushed x                            2. This stricture was then stented with one plastic                            stent.   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  Underwent EUS/ERCP.  Fine-needle aspiration of the cystic  lesion in the pancreatic head was performed.  Pancreatic parenchymal abnormalities were noted on EUS.  Distally enhancing lesion was found in the periduodenal peritoneal cavity.  A biopsy was performed.  Biliary sludge was found.  Single severe biliary stricture was noted in the main bile duct.  It was brushed.  And then a stent was placed. Bilirubin remains elevated along with alkaline phosphatase.  AST and ALT also elevated. Lipase was normal.  GI continues to follow. Significant increase in WBC noted today.  He is afebrile.  He is not on any antibiotics currently.  Continue to monitor for now.  Recheck labs tomorrow.  Low threshold for initiating antibiotics if he were to become febrile.  Hematuria/possible bladder/prostate mass concerning for malignancy: Dr. Felipa Eth with urology recommended outpatient follow-up with urology for biopsy per ED PA and H&P Experienced retention yesterday.  Started back on his Flomax and Proscar.  Was able to void on his own subsequently.  Continue to monitor with as needed bladder scans.  Acute encephalopathy Noted to be confused and distracted yesterday evening.  No focal neurological deficits noted.  Ammonia level was normal when recently checked.  Patient did get 2 doses of Ativan yesterday which could have contributed.  This has been discontinued since there is no evidence of alcohol withdrawal.  Continue to reorient daily.  No indication to do imaging studies at this time.  Acute on chronic blood loss anemia Hemoglobin low but stable.  Mild hyponatremia Stable.  Continue monitoring  Hypokalemia: Supplemented.  Magnesium was 2.5   Normal anion gap metabolic acidosis Bicarbonate level noted to be lower today.  Will start sodium bicarbonate.    BLE edema Could be due to malignancy, hypoalbuminemia and anemia. -Encourage leg elevation -Try TED hose Will order lower extremity Doppler studies.   Alcohol use disorder Denied alcohol for several weeks.   However, per ED documentation, his wife reported ongoing use.  Continue thiamine and multivitamins.  Ativan discontinued due to confusion.   Essential hypertension Normotensive off home antihypertensive meds. -Continue holding losartan and thiazide diuretics  Obesity: Estimated body mass index is 31.57 kg/m as calculated from the following:   Height as of this encounter: '5\' 10"'$  (1.778 m).   Weight as of this encounter: 99.8 kg.   DVT prophylaxis: SCDs Code Status: Full code Family Communication: None at bedside Disposition: Hopefully return home when improved.  Involve PT and OT.  Status is: Inpatient Remains inpatient appropriate because: Abnormal LFTs, pancreatic mass, biliary stricture  Consultants:  Gastroenterology Urology    Sch Meds:  Scheduled Meds:  finasteride  5 mg Oral Daily   folic acid  1 mg Oral Daily   multivitamin with minerals  1 tablet Oral Daily   tamsulosin  0.4 mg Oral Daily   thiamine  100 mg Oral Daily   Or   thiamine  100 mg Intravenous Daily   Continuous Infusions:   PRN Meds:.albuterol, haloperidol lactate  Antimicrobials: Anti-infectives (From admission, onward)    None         CBC: Recent Labs  Lab 05/11/22 1655 05/12/22 0628 05/13/22 0407 05/14/22 0303 05/15/22 0622  WBC 12.5* 12.1* 10.2 10.6* 22.5*  NEUTROABS 10.1*  --   --   --   --   HGB 8.2* 8.1* 8.1* 8.1* 8.4*  HCT 23.1* 21.9* 22.3* 22.1* 22.7*  MCV 87.8 86.2 87.8 87.7 85.3  PLT 479* 446* 481* 426* 427*    BMP &GFR Recent Labs  Lab 05/11/22 1655 05/12/22 0628 05/13/22 0407 05/14/22 0303 05/15/22 0622  NA 133* 135 134* 136 136  K 3.5 3.6 3.2* 3.4* 3.9  CL 107 109 109 109 108  CO2 17* 18* 17* 19* 16*  GLUCOSE 122* 97 108* 114* 92  BUN '15 18 16 21 15  '$ CREATININE 0.87 0.90 0.90 1.08 0.80  CALCIUM 8.0* 8.2* 8.2* 8.2* 8.1*  MG  --   --  2.4 2.5*  --   PHOS  --   --  3.6 5.4*  --     Estimated Creatinine Clearance: 98.8 mL/min (by C-G formula based on  SCr of 0.8 mg/dL).  Liver & Pancreas: Recent Labs  Lab 05/11/22 1655 05/12/22 0628 05/13/22 0407 05/14/22 0303 05/15/22 0622  AST 50* 55* 52* 53* 75*  ALT 59* 61* 57* 53* 62*  ALKPHOS 380* 374* 360* 350* 370*  BILITOT 18.4* 18.5* 17.4* 17.4* 19.2*  PROT 6.0* 6.0* 5.7* 5.5* 5.4*  ALBUMIN 1.8* 1.7* 1.6* 1.5* 1.5*    Recent Labs  Lab 05/11/22 1655 05/12/22 0628 05/14/22 0303  LIPASE 41 33 44     Coagulation Profile: Recent Labs  Lab 05/12/22 0015 05/12/22 0628  INR 1.0 1.0     Anemia Panel: Recent Labs    05/13/22 0407  VITAMINB12 527  FOLATE 12.2  FERRITIN 75  TIBC 353  IRON 78  RETICCTPCT 3.6*     Radiology Studies: No results found.  [place] [place] [facility]  If 7PM-7AM, please contact night-coverage [email or web address] 05/15/2022, 8:31 AM

## 2022-05-15 NOTE — Plan of Care (Signed)

## 2022-05-15 NOTE — Progress Notes (Signed)
Progress Note   Subjective  Chief Complaint: Elevated LFTs, metastatic bladder cancer  Status post ERCP 05/13/22 (see op note for full details) but difficult cannulation requiring double wire technique, prophylactic pancreatic stent placed, single severe biliary stricture was found to milligray the main bile duct which was malignant appearing, brushed for cytology also stented   Patient found up and walking the halls.  Per family he has been very agitated today and wants to leave AMA.  He is tired of being in the hospital.  Wife says "congested", I think she means respiratory wise.   Objective   Vital signs in last 24 hours: Temp:  [98 F (36.7 C)-98.2 F (36.8 C)] 98.2 F (36.8 C) (02/25 0506) Pulse Rate:  [74-83] 83 (02/25 0506) Resp:  [17] 17 (02/25 0506) BP: (122-150)/(63-67) 150/67 (02/25 0506) SpO2:  [100 %] 100 % (02/25 0506) Last BM Date : 05/14/22 General:    Ill-appearing AA male in NAD Heart:  Regular rate and rhythm; no murmurs Lungs: Respirations even and unlabored, lungs CTA bilaterally Abdomen:  Soft, mild generalized TTP and mild distention. Normal bowel sounds. Psych: Irritated and uncooperative  Intake/Output from previous day: 02/24 0701 - 02/25 0700 In: 120 [P.O.:120] Out: -    Lab Results: Recent Labs    05/13/22 0407 05/14/22 0303 05/15/22 0622  WBC 10.2 10.6* 22.5*  HGB 8.1* 8.1* 8.4*  HCT 22.3* 22.1* 22.7*  PLT 481* 426* 427*   BMET Recent Labs    05/13/22 0407 05/14/22 0303 05/15/22 0622  NA 134* 136 136  K 3.2* 3.4* 3.9  CL 109 109 108  CO2 17* 19* 16*  GLUCOSE 108* 114* 92  BUN '16 21 15  '$ CREATININE 0.90 1.08 0.80  CALCIUM 8.2* 8.2* 8.1*      Latest Ref Rng & Units 05/15/2022    6:22 AM 05/14/2022    3:03 AM 05/13/2022    4:07 AM  Hepatic Function  Total Protein 6.5 - 8.1 g/dL 5.4  5.5  5.7   Albumin 3.5 - 5.0 g/dL 1.5  1.5  1.6   AST 15 - 41 U/L 75  53  52   ALT 0 - 44 U/L 62  53  57   Alk Phosphatase 38 - 126 U/L 370   350  360   Total Bilirubin 0.3 - 1.2 mg/dL 19.2  17.4  17.4      Studies/Results: DG ERCP  Result Date: 05/14/2022 CLINICAL DATA:  72 year old male with history of biliary obstruction. EXAM: ERCP TECHNIQUE: Multiple spot images obtained with the fluoroscopic device and submitted for interpretation post-procedure. FLUOROSCOPY TIME:  81.7 mGy COMPARISON:  CT abdomen pelvis from 05/11/2022 FINDINGS: Retrograde cannulation of the common bile duct into the intrahepatic ducts and cholangiogram demonstrates severe intrahepatic biliary duct dilation with obstructive hilar mass. A plastic biliary stent is placed. IMPRESSION: 1. Severe biliary obstruction at the level of the hilum. 2. Common bile duct stent was placed. These images were submitted for radiologic interpretation only. Please see the procedural report for the full procedural details, amount of contrast, and the fluoroscopy time utilized. Ruthann Cancer, MD [facility] Radiology Electronically Signed   By: Ruthann Cancer M.D.   On: 05/14/2022 08:06     Assessment / Plan:   Assessment: 1.  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 bili elevated to 18.4--> 17.4--> 19.2 overnight, white count also increasing 10.6--> 22.5, status post ERCP  with stent placement in the pancreatic and CBD 2/23, continues with abdominal pain, itching and confusion 2.  Hematuria 3.  Alcohol use disorder 4.  Abnormal CT of the abdomen: With likely bladder mass and mets/lymphadenopathy  Plan: 1.  Given increasing LFTs and white count and discussion with Dr. Rush Landmark recommends another CT of the abdomen pelvis with contrast for further evaluation to consider complication after recent stent placement. 2.  Patient is very irritated.  Discussed we are trying to do our best to take the best care of him to make him feel better, but in  order to do that he needs to stay in the hospital. 3.  Continue current diet 4.  Continue antibiotics for at least the next 2 days 5.  Cytology pending  Thank you for your kind consultation, we will continue to follow. Dr. Lorenso Courier will be taking over our service tomorrow.   LOS: 3 days   [name]  05/15/2022, 1:27 PM

## 2022-05-15 NOTE — Discharge Summary (Incomplete)
[facility]  Physician Discharge Summary   Patient ID: Eddie Mendoza MRN: [id number] DOB/AGE: Mar 10, 1951 72 y.o.  Admit date: 05/11/2022 Discharge date:   ***  PCP: [facility] [place]:  Principal Problem:   Elevated liver enzymes Active Problems:   Hematuria   Essential hypertension   Acute on chronic blood loss anemia   Hyponatremia   Normal anion gap metabolic acidosis   Alcohol use disorder   Pancreatic mass   Bladder mass   Enlarged prostate   Obstructive jaundice   RECOMMENDATIONS FOR OUTPATIENT FOLLOW UP: *** (include homehealth, outpatient follow-up instructions, specific recommendations for PCP to follow-up on, etc.)   Home Health:***  Equipment/Devices:***   CODE STATUS:***   DISCHARGE CONDITION: {condition:18240}  Diet recommendation: ***  INITIAL HISTORY: ***  Consultations: ***  Procedures: *** (i.e. Studies not automatically included, echos, thoracentesis, etc; not x-rays)  HOSPITAL COURSE: ***         Estimated body mass index is 31.57 kg/m as calculated from the following:   Height as of this encounter: '5\' 10"'$  (1.778 m).   Weight as of this encounter: 99.8 kg.              PERTINENT LABS:  The results of significant diagnostics from this hospitalization (including imaging, microbiology, ancillary and laboratory) are listed below for reference.    Microbiology: No results found for this or any previous visit (from the past 240 hour(s)).   Labs:   Basic Metabolic Panel: Recent Labs  Lab 05/11/22 1655 05/12/22 0628 05/13/22 0407 05/14/22 0303 05/15/22 0622  NA 133* 135 134* 136 136  K 3.5 3.6 3.2* 3.4* 3.9  CL 107 109 109 109 108  CO2 17* 18* 17* 19* 16*  GLUCOSE 122* 97 108* 114* 92  BUN '15 18 16 21 15  '$ CREATININE 0.87 0.90 0.90 1.08 0.80  CALCIUM 8.0* 8.2* 8.2* 8.2* 8.1*  MG  --   --  2.4 2.5*  --   PHOS  --   --  3.6 5.4*  --    Liver Function Tests: Recent Labs   Lab 05/11/22 1655 05/12/22 0628 05/13/22 0407 05/14/22 0303 05/15/22 0622  AST 50* 55* 52* 53* 75*  ALT 59* 61* 57* 53* 62*  ALKPHOS 380* 374* 360* 350* 370*  BILITOT 18.4* 18.5* 17.4* 17.4* 19.2*  PROT 6.0* 6.0* 5.7* 5.5* 5.4*  ALBUMIN 1.8* 1.7* 1.6* 1.5* 1.5*   Recent Labs  Lab 05/11/22 1655 05/12/22 0628 05/14/22 0303  LIPASE 41 33 44   Recent Labs  Lab 05/14/22 1235  AMMONIA 29   CBC: Recent Labs  Lab 05/11/22 1655 05/12/22 0628 05/13/22 0407 05/14/22 0303 05/15/22 0622  WBC 12.5* 12.1* 10.2 10.6* 22.5*  NEUTROABS 10.1*  --   --   --   --   HGB 8.2* 8.1* 8.1* 8.1* 8.4*  HCT 23.1* 21.9* 22.3* 22.1* 22.7*  MCV 87.8 86.2 87.8 87.7 85.3  PLT 479* 446* 481* 426* 427*   Cardiac Enzymes: No results for input(s): "CKTOTAL", "CKMB", "CKMBINDEX", "TROPONINI" in the last 168 hours. BNP: BNP (last 3 results) No results for input(s): "BNP" in the last 8760 hours.  ProBNP (last 3 results) No results for input(s): "PROBNP" in the last 8760 hours.  CBG: No results for input(s): "GLUCAP" in the last 168 hours.   IMAGING STUDIES DG ERCP  Result Date: 05/14/2022 CLINICAL DATA:  72 year old male with history of biliary obstruction. EXAM: ERCP TECHNIQUE: Multiple spot images obtained with the fluoroscopic device  and submitted for interpretation post-procedure. FLUOROSCOPY TIME:  81.7 mGy COMPARISON:  CT abdomen pelvis from 05/11/2022 FINDINGS: Retrograde cannulation of the common bile duct into the intrahepatic ducts and cholangiogram demonstrates severe intrahepatic biliary duct dilation with obstructive hilar mass. A plastic biliary stent is placed. IMPRESSION: 1. Severe biliary obstruction at the level of the hilum. 2. Common bile duct stent was placed. These images were submitted for radiologic interpretation only. Please see the procedural report for the full procedural details, amount of contrast, and the fluoroscopy time utilized. Ruthann Cancer, MD [facility] Radiology Electronically Signed   By: Ruthann Cancer M.D.   On: 05/14/2022 08:06   CT ABDOMEN PELVIS W CONTRAST  Result Date: 05/12/2022 CLINICAL DATA:  Biliary obstruction suspected (Ped 0-17y) EXAM: CT ABDOMEN AND PELVIS WITH CONTRAST TECHNIQUE: Multidetector CT imaging of the abdomen and pelvis was performed using the standard protocol following bolus administration of intravenous contrast. RADIATION DOSE REDUCTION: This exam was performed according to the departmental dose-optimization program which includes automated exposure control, adjustment of the mA and/or kV according to patient size and/or use of iterative reconstruction technique. CONTRAST:  46m OMNIPAQUE IOHEXOL 350 MG/ML SOLN COMPARISON:  CT abdomen pelvis 03/21/2022 FINDINGS: Lower chest: No acute abnormality. Hepatobiliary: No focal liver abnormality. Poorly visualized gallbladder with persistent gallbladder wall thickening around the neck (3:24). Interval worsening of severe intra and extrahepatic biliary ductal dilatation. Pancreas: Persistent stable 2.9 x 2.4 cm proximal pancreatic duct hypodense lesion. Slightly hazy pancreatic contour proximally with trace fat stranding. No main pancreatic ductal dilatation. Spleen: Normal in size without focal abnormality. Adrenals/Urinary Tract: No adrenal nodule bilaterally. Bilateral kidneys enhance symmetrically. No hydronephrosis. No hydroureter. Redemonstration of a heterogeneous masslike intraluminal lesion within the urinary bladder. Persistent urine bladder wall thickening. On delayed imaging, there is no urothelial wall thickening and there are no filling defects in the opacified portions of the bilateral collecting systems or ureters. Stomach/Bowel: Stomach is within normal limits. No evidence of small bowel wall thickening or dilatation. No large bowel dilatation. Diffuse large bowel wall thickening. No pericolonic fat stranding. Scattered  colonic diverticula. Appendix appears normal. Vascular/Lymphatic: No abdominal aorta or iliac aneurysm. Mild atherosclerotic plaque of the aorta and its branches. Persistent retroperitoneal lymphadenopathy as an example a 1.8 cm left periaortic lymph node (3:34). Difficult to delineate suggestion of portacaval lymphadenopathy (3:18). Reproductive: The prostate is enlarged and heterogeneous measuring up to 7.3 cm. Other: Interval increase in trace simple fluid ascites. No intraperitoneal free gas. No organized fluid collection. Musculoskeletal: No abdominal wall hernia or abnormality. No suspicious lytic or blastic osseous lesions. No acute displaced fracture. Multilevel degenerative changes of the spine. IMPRESSION: 1. Question development of acute pancreatitis. Correlate with lipase levels. 2. Proximal pancreatic 2.9 cm cystic mass with interval worsening of severe intra and extrahepatic biliary ductal dilatation. Persistent poorly visualized gallbladder with gallbladder neck thickening. 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 When the patient is clinically stable and able to follow directions and hold their breath (preferably as an outpatient) further evaluation with dedicated MRI with and without contrast should be considered. 3. Persistent heterogeneous masslike intraluminal lesion within the urinary bladder. Finding could represent a urinary bladder or prostatic mass versus blood products. Recommend urologic consultation. 4. Marked Prostatomegaly and heterogeneity of the prostate gland. 5. Retroperitoneal and likely portacaval lymphadenopathy. 6. Colonic diverticulosis with no acute diverticulitis. 7. Question diffuse colonic bowel thickening with no definite findings of colitis. 8. Trace simple  rib fluid ascites. Electronically Signed   By: Iven Finn M.D.   On: 05/12/2022 00:14    DISCHARGE EXAMINATION: Vitals:   05/14/22 0656 05/14/22 0805  05/14/22 1530 05/15/22 0506  BP: 123/64 123/61 122/63 (!) 150/67  Pulse: 85 62 74 83  Resp: '18 16 17 17  '$ Temp: 97.7 F (36.5 C)  98 F (36.7 C) 98.2 F (36.8 C)  TempSrc: Oral  Oral Oral  SpO2: 100% 100% 100% 100%  Weight:      Height:       {physical exam:3041130}  DISPOSITION: ***     Current Inpatient Medications: {medication reviewed/display:3041432}  Allergies as of 05/15/2022   No Known Allergies   Med Rec must be completed prior to using this [place]***          TOTAL DISCHARGE TIME: ***  [facility] Pager on [email or web address]  05/15/2022, 1:49 PM

## 2022-05-16 ENCOUNTER — Inpatient Hospital Stay (HOSPITAL_COMMUNITY): Payer: No Typology Code available for payment source

## 2022-05-16 ENCOUNTER — Telehealth: Payer: Self-pay

## 2022-05-16 ENCOUNTER — Encounter (HOSPITAL_COMMUNITY): Payer: Self-pay | Admitting: Gastroenterology

## 2022-05-16 DIAGNOSIS — R609 Edema, unspecified: Secondary | ICD-10-CM | POA: Diagnosis not present

## 2022-05-16 DIAGNOSIS — N3289 Other specified disorders of bladder: Secondary | ICD-10-CM | POA: Diagnosis not present

## 2022-05-16 DIAGNOSIS — R748 Abnormal levels of other serum enzymes: Secondary | ICD-10-CM | POA: Diagnosis not present

## 2022-05-16 DIAGNOSIS — K831 Obstruction of bile duct: Secondary | ICD-10-CM | POA: Diagnosis not present

## 2022-05-16 DIAGNOSIS — D649 Anemia, unspecified: Secondary | ICD-10-CM | POA: Diagnosis not present

## 2022-05-16 LAB — COMPREHENSIVE METABOLIC PANEL
ALT: 57 U/L — ABNORMAL HIGH (ref 0–44)
AST: 64 U/L — ABNORMAL HIGH (ref 15–41)
Albumin: <1.5 g/dL — ABNORMAL LOW (ref 3.5–5.0)
Alkaline Phosphatase: 331 U/L — ABNORMAL HIGH (ref 38–126)
Anion gap: 10 (ref 5–15)
BUN: 18 mg/dL (ref 8–23)
CO2: 17 mmol/L — ABNORMAL LOW (ref 22–32)
Calcium: 7.9 mg/dL — ABNORMAL LOW (ref 8.9–10.3)
Chloride: 106 mmol/L (ref 98–111)
Creatinine, Ser: 1.09 mg/dL (ref 0.61–1.24)
GFR, Estimated: >60 mL/min (ref >60)
Glucose, Bld: 105 mg/dL — ABNORMAL HIGH (ref 70–99)
Potassium: 3.4 mmol/L — ABNORMAL LOW (ref 3.5–5.1)
Sodium: 133 mmol/L — ABNORMAL LOW (ref 135–145)
Total Bilirubin: 18.4 mg/dL (ref 0.3–1.2)
Total Protein: 5.3 g/dL — ABNORMAL LOW (ref 6.5–8.1)

## 2022-05-16 LAB — CBC WITH DIFFERENTIAL/PLATELET
Abs Immature Granulocytes: 0.27 10*3/uL — ABNORMAL HIGH (ref 0.00–0.07)
Basophils Absolute: 0 10*3/uL (ref 0.0–0.1)
Basophils Relative: 0 %
Eosinophils Absolute: 0 10*3/uL (ref 0.0–0.5)
Eosinophils Relative: 0 %
HCT: 20.9 % — ABNORMAL LOW (ref 39.0–52.0)
Hemoglobin: 7.5 g/dL — ABNORMAL LOW (ref 13.0–17.0)
Immature Granulocytes: 1 %
Lymphocytes Relative: 2 %
Lymphs Abs: 0.4 10*3/uL — ABNORMAL LOW (ref 0.7–4.0)
MCH: 31.4 pg (ref 26.0–34.0)
MCHC: 35.9 g/dL (ref 30.0–36.0)
MCV: 87.4 fL (ref 80.0–100.0)
Monocytes Absolute: 1.3 10*3/uL — ABNORMAL HIGH (ref 0.1–1.0)
Monocytes Relative: 7 %
Neutro Abs: 16.9 10*3/uL — ABNORMAL HIGH (ref 1.7–7.7)
Neutrophils Relative %: 90 %
Platelets: 374 10*3/uL (ref 150–400)
RBC: 2.39 MIL/uL — ABNORMAL LOW (ref 4.22–5.81)
RDW: 23.9 % — ABNORMAL HIGH (ref 11.5–15.5)
WBC: 19 10*3/uL — ABNORMAL HIGH (ref 4.0–10.5)
nRBC: 0 % (ref 0.0–0.2)

## 2022-05-16 MED ORDER — CIPROFLOXACIN HCL 500 MG PO TABS
500.0000 mg | ORAL_TABLET | Freq: Two times a day (BID) | ORAL | Status: DC
  Administered 2022-05-16: 500 mg via ORAL
  Filled 2022-05-16: qty 1

## 2022-05-16 MED ORDER — IOHEXOL 350 MG/ML SOLN
75.0000 mL | Freq: Once | INTRAVENOUS | Status: AC | PRN
  Administered 2022-05-16: 75 mL via INTRAVENOUS

## 2022-05-16 MED ORDER — SODIUM CHLORIDE 0.9 % IV SOLN
3.0000 g | Freq: Four times a day (QID) | INTRAVENOUS | Status: DC
  Administered 2022-05-16 – 2022-05-17 (×4): 3 g via INTRAVENOUS
  Filled 2022-05-16 (×4): qty 8

## 2022-05-16 MED ORDER — ADULT MULTIVITAMIN W/MINERALS CH: 1.0000 | ORAL_TABLET | ORAL | Status: DC

## 2022-05-16 NOTE — Progress Notes (Signed)
Patient ID: Eddie Mendoza, male   DOB: [birth year], 72 y.o.   MRN: [id number]    Progress Note   Subjective   Day # 5  CC; elevated LFTs, metastatic bladder cancer  ERCP 05/13/2022-difficult cannulation requiring double wire technique, prophylactic pancreatic stent placed, single severe biliary stricture found measuring about 20 mm in length the main bile duct, sphincterotomy done, 10 French by 9 cm plastic biliary stent placed, cytology done  Labs today-cytology pending WBC 19,000/hemoglobin 7.5/hematocrit 20.9 Potassium 3.4 T. bili 18.4/alk phos 331/AST 64/ALT 57  CT abdomen pelvis today, severe intrahepatic biliary dilation with interval stent placement from the intrahepatic ductal confluence through the CBD into the duodenum scattered variably dilated ducts could be secondary to the ERCP and stent placement or infectious process, ill-defined thickening of the gallbladder neck appears similar, probably increased proximal peripancreatic edema concerning for pancreatitis, 3 x 2.5 cm cystic lesion in the pancreatic head unchanged, large heterogeneous bladder mass redemonstrated and mild free fluid in the abdomen pelvis, stable retroperitoneal adenopathy  Afebrile thus far today  Patient says he feels okay today , to tolerate some solid food, he denies any abdominal pain  Several family members at bedside, updated  Patient has been wanting to be discharged earlier this morning per medicine team Plan to the patient and family the reason for wanting him to stay in the hospital, to be sure that the stent was functioning as he still has extremely high bilirubin, and also has WBC of 19,000.    Objective   Vital signs in last 24 hours: Temp:  [97.7 F (36.5 C)-98.8 F (37.1 C)] 98.8 F (37.1 C) (02/26 0727) Pulse Rate:  [82-104] 82 (02/26 0727) Resp:  [17-18] 17 (02/26 0727) BP: (121-145)/(56-66) 121/63 (02/26 0727) SpO2:  [98 %-100 %] 100 % (02/26 0727) Last BM Date :  05/14/22 General:    Older African-American male in NAD, pleasant Heart:  Regular rate and rhythm; no murmurs Lungs: Respirations even and unlabored, lungs CTA bilaterally Abdomen: Full feeling, positive fluid, nontender bowel sounds are present.  Extremities:  Without edema. Neurologic:  Alert and oriented,  grossly normal neurologically. Psych:  Cooperative. Normal mood and affect.  Intake/Output from previous day: 02/25 0701 - 02/26 0700 In: 720 [P.O.:720] Out: -  Intake/Output this shift: No intake/output data recorded.  Lab Results: Recent Labs    05/14/22 0303 05/15/22 0622 05/16/22 0224  WBC 10.6* 22.5* 19.0*  HGB 8.1* 8.4* 7.5*  HCT 22.1* 22.7* 20.9*  PLT 426* 427* 374   BMET Recent Labs    05/14/22 0303 05/15/22 0622 05/16/22 0224  NA 136 136 133*  K 3.4* 3.9 3.4*  CL 109 108 106  CO2 19* 16* 17*  GLUCOSE 114* 92 105*  BUN '21 15 18  '$ CREATININE 1.08 0.80 1.09  CALCIUM 8.2* 8.1* 7.9*   LFT Recent Labs    05/16/22 0224  PROT 5.3*  ALBUMIN <1.5*  AST 64*  ALT 57*  ALKPHOS 331*  BILITOT 18.4*   PT/INR No results for input(s): "LABPROT", "INR" in the last 72 hours.  Studies/Results: VAS [place] LOWER EXTREMITY VENOUS (DVT)  Result Date: 05/16/2022  Lower Venous DVT Study Patient Name:  Eddie Mendoza  Date of Exam:   05/16/2022 Medical Rec #: [id number]         Accession #:    [id number] Date of Birth: June 18, 1950          Patient Gender: M Patient Age:   9 years Exam Location:  [facility] Procedure:      VAS [place] LOWER EXTREMITY VENOUS (DVT) Referring Phys: Bonnielee Haff --------------------------------------------------------------------------------  Indications: Edema.  Limitations: Poor ultrasound/tissue interface. Comparison Study: No previous exams Performing Technologist: Jody Hill RVT, RDMS  Examination Guidelines: A complete evaluation includes B-mode imaging, spectral Doppler, color Doppler, and power Doppler as needed of all accessible  portions of each vessel. Bilateral testing is considered an integral part of a complete examination. Limited examinations for reoccurring indications may be performed as noted. The reflux portion of the exam is performed with the patient in reverse Trendelenburg.  +---------+---------------+---------+-----------+----------+--------------+ RIGHT    CompressibilityPhasicitySpontaneityPropertiesThrombus Aging +---------+---------------+---------+-----------+----------+--------------+ CFV      Full           No       Yes                                 +---------+---------------+---------+-----------+----------+--------------+ SFJ      Full                                                        +---------+---------------+---------+-----------+----------+--------------+ FV Prox  Full           Yes      Yes                                 +---------+---------------+---------+-----------+----------+--------------+ FV Mid   Full           Yes      Yes                                 +---------+---------------+---------+-----------+----------+--------------+ FV DistalFull           Yes      Yes                                 +---------+---------------+---------+-----------+----------+--------------+ PFV      Full                                                        +---------+---------------+---------+-----------+----------+--------------+ POP      Full           Yes      Yes                                 +---------+---------------+---------+-----------+----------+--------------+ PTV      Full                                                        +---------+---------------+---------+-----------+----------+--------------+ PERO     Full                                                        +---------+---------------+---------+-----------+----------+--------------+   +---------+---------------+---------+-----------+----------+--------------+  LEFT      CompressibilityPhasicitySpontaneityPropertiesThrombus Aging +---------+---------------+---------+-----------+----------+--------------+ CFV      Full           Yes      Yes                                 +---------+---------------+---------+-----------+----------+--------------+ SFJ      Full                                                        +---------+---------------+---------+-----------+----------+--------------+ FV Prox  Full           Yes      Yes                                 +---------+---------------+---------+-----------+----------+--------------+ FV Mid   Full           Yes      Yes                                 +---------+---------------+---------+-----------+----------+--------------+ FV DistalFull           Yes      Yes                                 +---------+---------------+---------+-----------+----------+--------------+ PFV      Full                                                        +---------+---------------+---------+-----------+----------+--------------+ POP      Full           Yes      Yes                                 +---------+---------------+---------+-----------+----------+--------------+ PTV      Full                                                        +---------+---------------+---------+-----------+----------+--------------+ PERO     Full                                                        +---------+---------------+---------+-----------+----------+--------------+    Summary: BILATERAL: - No evidence of deep vein thrombosis seen in the lower extremities, bilaterally. -No evidence of popliteal cyst, bilaterally.   *See table(s) above for measurements and observations.    Preliminary    CT ABDOMEN PELVIS W CONTRAST  Result Date: 05/16/2022 CLINICAL DATA:  Biliary stent deployment with elevated white count, difficult ERCP. EXAM: CT ABDOMEN AND  PELVIS WITH CONTRAST TECHNIQUE: Multidetector CT  imaging of the abdomen and pelvis was performed using the standard protocol following bolus administration of intravenous contrast. RADIATION DOSE REDUCTION: This exam was performed according to the departmental dose-optimization program which includes automated exposure control, adjustment of the mA and/or kV according to patient size and/or use of iterative reconstruction technique. CONTRAST:  16m OMNIPAQUE IOHEXOL 350 MG/ML SOLN COMPARISON:  CT with IV contrast 05/11/2022, CT with IV contrast 03/21/2022. FINDINGS: Lower chest: No acute abnormality. Hepatobiliary: Respiratory motion limits image quality as well as artifact from the patient's arms in the field. Gallbladder is fluid-filled but again not dilated. Ill-defined thickening in the gallbladder neck is again noted on 3: 30-33. Similar to the last CT there is severe intrahepatic biliary dilatation, with no appreciable improvement despite interval stent placement from the intrahepatic ductal confluence through the CBD into the duodenum. A short wire or small caliber stent extends from the CBD stent into the proximal pancreatic duct. There is scattered air in the dilated bile ducts which could be due to the ERCP and stent placement or infectious process. No liver mass or hepatic abscess is seen. Pancreas: Allowing for respiratory motion there is probably increased proximal peripancreatic edema, concerning for pancreatitis. A short wire or in proximal pancreatic duct is noted. There is no downstream ductal dilatation. 3 x 2.5 cm cystic lesion again is noted of the pancreatic head. Spleen: Mildly prominent measuring 14.1 cm coronal, previously 13.5 cm coronal. No mass or abscess. Adrenals/Urinary Tract: No adrenal or renal mass is seen, no urinary stone or obstruction. There is symmetric delayed phase renal excretion. There is a large heterogeneous mass redemonstrated in the bladder. There is elsewhere mild generalized thickening of the bladder. Stomach/Bowel:  No bowel obstruction or inflammatory change is seen. The appendix is normal. Vascular/Lymphatic: Stable retroperitoneal adenopathy, largest index lymph node in the left periaortic chain again measuring 2 cm in short axis. Aortic atherosclerosis. Reproductive: Severe prostatomegaly. Other: Mild free ascites in the abdomen and pelvis.  No free air. Musculoskeletal: No new findings. IMPRESSION: 1. Similar to the last CT there is severe intrahepatic biliary dilatation, but with interval stent placement from the intrahepatic ductal confluence through the CBD into the duodenum. There is scattered air in the dilated bile ducts which could be due to the ERCP and stent placement or infectious process. Ill-defined thickening in the gallbladder neck appears similar. 2. Allowing for respiratory motion, there is probably increased proximal peripancreatic edema, concerning for pancreatitis. 3. 3 x 2.5 cm cystic lesion of the pancreatic head, unchanged. 4. Large heterogeneous bladder mass redemonstrated. 5. Mild free fluid in the abdomen and pelvis. 6. Severe prostatomegaly. 7. Stable retroperitoneal adenopathy. 8. Aortic atherosclerosis. Aortic Atherosclerosis (ICD10-I70.0). Electronically Signed   By: KTelford NabM.D.   On: 05/16/2022 02:56       Assessment / Plan:    [id number] old white male presenting with jaundice, in the setting of known bladder cancer.  He has also had a weight loss of about 20 pounds over the past 3 weeks.  Workup is most consistent with metastatic bladder cancer, and found to have severe intra and extrahepatic biliary ductal dilation.  ERCP 3 days ago, difficult procedure, but found to have a long common bile duct stricture measuring about 20 mm stricturotomy was done and 10 French 9 cm plastic stent was placed, he also had placement of a temporary plastic pancreatic stent  He has been stable postprocedure but has a persistent leukocytosis with WBC  of 19,000, has had minimal improvement in  LFTs and persistent significant hyperbilirubinemia with T. bili of 18.1 today  Concern at this point is whether or not the stent is functioning well, question not bridging the entire stricture  Stricture was malignant appearing, cytology is pending  #2 ascites, likely malignant #3 EtOH use disorder  Plan; will start IV antibiotics Daily CBC and LFTs Await cytology If he does not have any clear improvement in hepatic parameters over the next 24 to 36 hours, he may need repeat ERCP with stent exchange or second stent placement. Findings were discussed with the patient and his family and they voiced understanding.      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

## 2022-05-16 NOTE — Social Work (Signed)
CSW reviewed pt again for consult for SU education and resources. Noting pt is disoriented today and not appropriate. CSW to add resources to AVS and follow for additional needs.

## 2022-05-16 NOTE — Evaluation (Signed)
Physical Therapy Evaluation and Discharge Patient Details Name: Eddie Mendoza MRN: [id number] DOB: 08/21/1950 Today's Date: 05/16/2022  History of Present Illness  72 y.o. male with a past medical history as listed below including Tiernan's urethral resection of bladder tumor in 2022, hematuria, anemia, BPH, substance abuse, hypertension and others, who presented to the ED on 05/11/2022 for jaundice and weight loss. CTAP with contrast showed question development of acute pancreatitis, proximal pancreatic cystic mass, urinary bladder mass, prostatic megaly, and retroperitoneal lymphadenopathy. 2/23 ERCP  Clinical Impression   Patient evaluated by Physical Therapy with no further acute PT needs identified. Patient ambulated 150 ft with no device with no loss of balance. Mild drifting left or right with head turns. PT is signing off with Mobility Team to continue to follow and mobilize. Thank you for this referral.        Recommendations for follow up therapy are one component of a multi-disciplinary discharge planning process, led by the attending physician.  Recommendations may be updated based on patient status, additional functional criteria and insurance authorization.  Follow Up Recommendations No PT follow up      Assistance Recommended at Discharge None  Patient can return home with the following       Equipment Recommendations None recommended by PT  Recommendations for Other Services       Functional Status Assessment Patient has not had a recent decline in their functional status     Precautions / Restrictions Precautions Precautions: Fall Restrictions Weight Bearing Restrictions: No      Mobility  Bed Mobility Overal bed mobility: Independent                  Transfers Overall transfer level: Independent Equipment used: None                    Ambulation/Gait Ambulation/Gait assistance: Supervision, Independent Gait Distance (Feet): 150  Feet Assistive device: None Gait Pattern/deviations: Step-through pattern, Decreased stride length, Drifts right/left   Gait velocity interpretation: 1.31 - 2.62 ft/sec, indicative of limited community ambulator   General Gait Details: drifts slightly with head turns; slow velocity that he reports is his normal  Financial trader Rank[employer] (Stroke Patients Only)       Balance Overall balance assessment: Mild deficits observed, not formally tested                                           Pertinent Vitals/Pain Pain Assessment Pain Assessment: No/denies pain    Home Living Family/patient expects to be discharged to:: Private residence Living Arrangements: Spouse/significant other Available Help at Discharge: Family Type of Home: House Home Access: Stairs to enter Entrance Stairs-Rails: Right Entrance Stairs-Number of Steps: 3   Home Layout: Two level;Able to live on main level with bedroom/bathroom Home Equipment: Rolling Walker (2 wheels)      Prior Function Prior Level of Function : Independent/Modified Independent             Mobility Comments: walked without a device       Hand Dominance        Extremity/Trunk Assessment   Upper Extremity Assessment Upper Extremity Assessment: Defer to OT evaluation    Lower Extremity Assessment Lower Extremity Assessment: Overall WFL for tasks assessed    Cervical /  Trunk Assessment Cervical / Trunk Assessment: Normal  Communication   Communication: No difficulties  Cognition Arousal/Alertness: Awake/alert Behavior During Therapy: WFL for tasks assessed/performed Overall Cognitive Status: Within Functional Limits for tasks assessed                                 General Comments: cognition not specifically tested        General Comments      Exercises     Assessment/Plan    PT Assessment Patient does not need any further PT  services  PT Problem List         PT Treatment Interventions      PT Goals (Current goals can be found in the Care Plan section)  Acute Rehab PT Goals PT Goal Formulation: All assessment and education complete, DC therapy    Frequency       Co-evaluation               AM-PAC PT "6 Clicks" Mobility  Outcome Measure Help needed turning from your back to your side while in a flat bed without using bedrails?: None Help needed moving from lying on your back to sitting on the side of a flat bed without using bedrails?: None Help needed moving to and from a bed to a chair (including a wheelchair)?: None Help needed standing up from a chair using your arms (e.g., wheelchair or bedside chair)?: None Help needed to walk in hospital room?: None Help needed climbing 3-5 steps with a railing? : None 6 Click Score: 24    End of Session Equipment Utilized During Treatment: Gait belt Activity Tolerance: Patient tolerated treatment well Patient left: in bed;with call bell/phone within reach;with nursing/sitter in room Nurse Communication: Mobility status;Other (comment) (no PT needs; discharge from PT) PT Visit Diagnosis: Other abnormalities of gait and mobility (R26.89)    Time: [id number] PT Time Calculation (min) (ACUTE ONLY): 9 min   Charges:   PT Evaluation $PT Eval Low Complexity: [place], PT Acute Rehabilitation Services  Office [phone]   Rexanne Mano 05/16/2022, 10:39 AM

## 2022-05-16 NOTE — Progress Notes (Signed)
PROGRESS NOTE  Eddie Mendoza [id number] DOB: Aug 21, 1950   PCP: [facility]  Patient is from: Home  DOA: 05/11/2022 LOS: 4  Brief Narrative / Interim history: 72 year old M with PMH of BPH, TURBT in 2022, hematuria, anemia, HTN, EtOH and substance use and recent ED visit on 1/1 when he left AMA returning with jaundice, unintentional 20 pound weight loss and itching, and admitted for obstructive jaundice/hyperbilirubinemia, pancreatic cystic mass and bladder/prostate mass.  Seen in the ED on 03/21/2022 for abdominal pain and hematuria.  CT was concerning for bladder neoplasm, gallbladder/biliary neoplasm, cystic lesions in the pancreatic head, and metastatic disease.  Patient refused hospitalization and left AMA at that time.   In ED, stable vitals.  Hgb 8.2 (9.6 on 1/24).  BUN 18.  AST 50.  ALT 59.  Total bili 18.4.  Lipase and INR normal.  CT with contrast concerning for questionable acute pancreatitis, 2.9 cm proximal pancreatic mass with interval worsening of severe intra and extrahepatic biliary ductal dilation concerning for hepatobiliary or pancreatic metastatic malignancy, bladder/prostate mass, marked prostamegaly and heterogenicity of prostate gland, retroperitoneal and post Cabbell LAD and questionable diffuse colonic bowel thickening with no definite finding of colitis. ED PA discussed the case with Dr. Felipa Eth with urology who felt that the patient is currently not a surgical candidate due to his other medical issues.  He recommended outpatient follow-up with urology for biopsy of the bladder mass.   GI consulted.  Patient underwent EUS and ERCP on 2/23.  Subjective: Patient states he is feeling well. Continues to have some very mild discomfort in his abdomen. Has a BM this am. Denies difficulty with urination. He is addiment that he wants to go home today.    Objective: Vitals:   05/15/22 0506 05/15/22 1905 05/16/22 0500 05/16/22 0727  BP: (!) 150/67 (!)  137/56 (!) 145/66 121/63  Pulse: 83 (!) 104 92 82  Resp: '17 18 18 17  '$ Temp: 98.2 F (36.8 C) 97.7 F (36.5 C) 98.4 F (36.9 C) 98.8 F (37.1 C)  TempSrc: Oral Oral Oral Oral  SpO2: 100% 100% 98% 100%  Weight:      Height:        Examination:  General appearance: Awake alert.  In no distress.  Resp: Clear to auscultation bilaterally.  Normal effort Cardio: S1-S2 is normal regular.  No S3-S4.  No rubs murmurs or bruit GI: Abdomen is soft.  Nontender nondistended.  Bowel sounds are present normal.  No masses organomegaly Extremities: 3+ pitting edema bilaterally.  Moving all of his extremities.  Deconditioning is noted. Neurologic: Oriented to self, place, city, year. Motor strength equal bilateral upper and lower extremities.  Gait not assessed.   Procedures:  EUS 2/23 Impression:               EGD Impression:                           - No gross lesions in the entire esophagus. Z-line                            irregular, 41 cm from the incisors.                           - 1 cm hiatal hernia.                           -  Gastritis. Biopsied.                           - J-shaped Deformity in the gastric body.                           - Congested duodenal mucosa throughout.                           EUS Impression:                           - There was dilation in the middle third of the                            main bile duct which measured up to 12 mm.                           - A cystic lesion was seen in the pancreatic head.                            Cytology results are pending. 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                            Fine needle aspiration for fluid performed.                           - Pancreatic parenchymal abnormalities consisting                            of atrophy, lobularity with honeycombing and                             hyperechoic strands were noted in the pancreatic                            head, genu of the pancreas, pancreatic body and                            pancreatic tail.                           - One heterogenous, distally enhancing lesion was                            found in the periduodenal peritoneal cavity. Fine                            needle biopsy performed. This is not clear if this  is a lymph node or other lesion.                           - A few cystic lesions were found in the visualized                            portion of the liver. No other concerning findings                            in the visualized liver.  ERCP 2/23 Impression:               - The major papilla appeared congested and small.                           - Very difficult cannulation requiring double wire                            technique.                           - The patient has a shared portion of the distal                            duct such that there was both pancreatic and                            biliary injection noted during initial cannulation.                           - Prophylactic pancreatic stent placed.                           - The biliary tree was swept and sludge was found.                           - A single severe biliary stricture was found in                            the middle third of the main bile duct. The                            stricture was malignant appearing. It was brushed x                            2. This stricture was then stented with one plastic                            stent.   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  Underwent EUS/ERCP.  Fine-needle aspiration of the cystic lesion in the pancreatic head was  performed.  Pancreatic parenchymal abnormalities were noted on EUS.  Distally enhancing lesion was found in the periduodenal peritoneal cavity.  A  biopsy was performed.  Biliary sludge was found.  Single severe biliary stricture was noted in the main bile duct.  It was brushed.  And then a stent was placed. Transaminases remain elevated and mildly improved today. Repeat abdominal CT overnight essentially unchanged.  - GI following, appreciate recs  - cipro '500mg'$  BID x3 days  - if no improvement in LFTs, may need repeat procedure - CMP am  Hematuria/possible bladder/prostate mass concerning for malignancy: Dr. Felipa Eth with urology recommended outpatient follow-up with urology for biopsy per ED PA and H&P Experienced retention previously.  Started back on his Flomax and Proscar.  Now urinating without issues  Acute encephalopathy Noted to be confused and distracted intermittently.  No focal neurological deficits noted.  Ammonia level was normal when recently checked.  Patient did get 2 doses of Ativan yesterday which could have contributed.  This has been discontinued since there is no evidence of alcohol withdrawal.  Continue to reorient daily.  No indication to do imaging studies at this time.  Acute on chronic blood loss anemia Hemoglobin low but stable.  Mild hyponatremia Stable.  Continue monitoring  Hypokalemia: Supplemented.  Magnesium was 2.5   Normal anion gap metabolic acidosis Bicarbonate level noted to be lower today.  - continue sodium bicarbonate.    BLE edema Could be due to malignancy, hypoalbuminemia and anemia. LE dopplers negative for DVT.  -Encourage leg elevation -Try TED hose - lasix daily   Alcohol use disorder Denied alcohol for several weeks.  However, per ED documentation, his wife reported ongoing use.  Continue thiamine and multivitamins.  Ativan discontinued due to confusion.   Essential hypertension Normotensive off home antihypertensive  meds. -Continue holding losartan and thiazide diuretics  Obesity: Estimated body mass index is 31.57 kg/m as calculated from the following:   Height as of this encounter: '5\' 10"'$  (1.778 m).   Weight as of this encounter: 99.8 kg.   DVT prophylaxis: SCDs Code Status: Full code Family Communication: wife on phone Disposition: home when cleared by GI  Status is: Inpatient Remains inpatient appropriate because: Abnormal LFTs, pancreatic mass, biliary stricture  Consultants:  Gastroenterology Urology  Sch Meds:  Scheduled Meds:  finasteride  5 mg Oral Daily   folic acid  1 mg Oral Daily   guaiFENesin  600 mg Oral BID   multivitamin with minerals  1 tablet Oral Daily   sodium bicarbonate  650 mg Oral BID   tamsulosin  0.4 mg Oral Daily   thiamine  100 mg Oral Daily   Or   thiamine  100 mg Intravenous Daily    PRN Meds:.albuterol, haloperidol lactate  CBC: Recent Labs  Lab 05/11/22 1655 05/12/22 0628 05/13/22 0407 05/14/22 0303 05/15/22 0622 05/16/22 0224  WBC 12.5* 12.1* 10.2 10.6* 22.5* 19.0*  NEUTROABS 10.1*  --   --   --   --  16.9*  HGB 8.2* 8.1* 8.1* 8.1* 8.4* 7.5*  HCT 23.1* 21.9* 22.3* 22.1* 22.7* 20.9*  MCV 87.8 86.2 87.8 87.7 85.3 87.4  PLT 479* 446* 481* 426* 427* 374    BMP &GFR Recent Labs  Lab 05/12/22 0628 05/13/22 0407 05/14/22 0303 05/15/22 0622 05/16/22 0224  NA 135 134* 136 136 133*  K 3.6 3.2* 3.4* 3.9 3.4*  CL 109 109 109 108 106  CO2 18* 17* 19* 16* 17*  GLUCOSE 97 108* 114* 92 105*  BUN '18 16 21 15 18  '$ CREATININE 0.90 0.90 1.08 0.80 1.09  CALCIUM 8.2* 8.2* 8.2* 8.1* 7.9*  MG  --  2.4 2.5*  --   --   PHOS  --  3.6 5.4*  --   --     Estimated Creatinine Clearance: 72.5 mL/min (by C-G formula based on SCr of 1.09 mg/dL).  Liver & Pancreas: Recent Labs  Lab 05/12/22 [id number] 05/13/22 0407 05/14/22 0303 05/15/22 0622 05/16/22 0224  AST 55* 52* 53* 75* 64*  ALT 61* 57* 53* 62* 57*  ALKPHOS 374* 360* 350* 370* 331*  BILITOT  18.5* 17.4* 17.4* 19.2* 18.4*  PROT 6.0* 5.7* 5.5* 5.4* 5.3*  ALBUMIN 1.7* 1.6* 1.5* 1.5* <1.5*    Recent Labs  Lab 05/11/22 1655 05/12/22 0628 05/14/22 0303  LIPASE 41 33 44     Coagulation Profile: Recent Labs  Lab 05/12/22 0015 05/12/22 0628  INR 1.0 1.0    Radiology Studies: CT ABDOMEN PELVIS W CONTRAST  Result Date: 05/16/2022 CLINICAL DATA:  Biliary stent deployment with elevated white count, difficult ERCP. EXAM: CT ABDOMEN AND PELVIS WITH CONTRAST TECHNIQUE: Multidetector CT imaging of the abdomen and pelvis was performed using the standard protocol following bolus administration of intravenous contrast. RADIATION DOSE REDUCTION: This exam was performed according to the departmental dose-optimization program which includes automated exposure control, adjustment of the mA and/or kV according to patient size and/or use of iterative reconstruction technique. CONTRAST:  78m OMNIPAQUE IOHEXOL 350 MG/ML SOLN COMPARISON:  CT with IV contrast 05/11/2022, CT with IV contrast 03/21/2022. FINDINGS: Lower chest: No acute abnormality. Hepatobiliary: Respiratory motion limits image quality as well as artifact from the patient's arms in the field. Gallbladder is fluid-filled but again not dilated. Ill-defined thickening in the gallbladder neck is again noted on 3: 30-33. Similar to the last CT there is severe intrahepatic biliary dilatation, with no appreciable improvement despite interval stent placement from the intrahepatic ductal confluence through the CBD into the duodenum. A short wire or small caliber stent extends from the CBD stent into the proximal pancreatic duct. There is scattered air in the dilated bile ducts which could be due to the ERCP and stent placement or infectious process. No liver mass or hepatic abscess is seen. Pancreas: Allowing for respiratory motion there is probably increased proximal peripancreatic edema, concerning for pancreatitis. A short wire or in proximal  pancreatic duct is noted. There is no downstream ductal dilatation. 3 x 2.5 cm cystic lesion again is noted of the pancreatic head. Spleen: Mildly prominent measuring 14.1 cm coronal, previously 13.5 cm coronal. No mass or abscess. Adrenals/Urinary Tract: No adrenal or renal mass is seen, no urinary stone or obstruction. There is symmetric delayed phase renal excretion. There is a large heterogeneous mass redemonstrated in the bladder. There is elsewhere mild generalized thickening of the bladder. Stomach/Bowel: No bowel obstruction or inflammatory change is seen. The appendix is normal. Vascular/Lymphatic: Stable retroperitoneal adenopathy, largest index lymph node in the left periaortic chain again measuring 2 cm in short axis. Aortic atherosclerosis. Reproductive: Severe prostatomegaly. Other: Mild free ascites in the abdomen and pelvis.  No free air. Musculoskeletal: No new findings. IMPRESSION: 1. Similar to the last CT there is severe intrahepatic biliary dilatation, but with interval stent placement from the intrahepatic ductal confluence through the CBD into the duodenum. There is scattered air in the dilated bile ducts which could be due to the ERCP and stent placement or infectious process. Ill-defined thickening in the gallbladder neck appears similar. 2. Allowing for respiratory motion, there is probably increased proximal peripancreatic edema, concerning for pancreatitis. 3. 3 x 2.5 cm  cystic lesion of the pancreatic head, unchanged. 4. Large heterogeneous bladder mass redemonstrated. 5. Mild free fluid in the abdomen and pelvis. 6. Severe prostatomegaly. 7. Stable retroperitoneal adenopathy. 8. Aortic atherosclerosis. Aortic Atherosclerosis (ICD10-I70.0). Electronically Signed   By: Telford Nab M.D.   On: 05/16/2022 02:56    [place]  If 7PM-7AM, please contact night-coverage [email or web address] 05/16/2022, 9:15 AM

## 2022-05-16 NOTE — Telephone Encounter (Signed)
Recall has been entered  

## 2022-05-16 NOTE — Telephone Encounter (Signed)
-----   Message from Irving Copas., MD sent at 05/14/2022  4:45 AM EST ----- Regarding: Follow up Eddie Mendoza, This patient needs ERCP stent exchange in 32-month. Thanks. GM

## 2022-05-16 NOTE — Progress Notes (Signed)
BLE venous duplex has been completed.   Results can be found under chart review under CV PROC. 05/16/2022 11:48 AM Juli Odom RVT, RDMS

## 2022-05-16 NOTE — Discharge Instructions (Signed)
                  Intensive Outpatient Programs  [facility]    [facility] [address]     [address] [place],  [place]     [place], [place] [phone]      [phone]  [facility]   [facility]  (Inpatient and outpatient)  [phone] (Suboxone and Methadone) [address]           [phone]           [facility]    [facility] - Intensive Outpatient [address]     [address] [place], [place] [address]     [place], [place]  [phone]      [phone]  [facility] (Outpatient, Inpatient, [facility]  [facility] (Groups and [facility]) (insurance only) [phone]    [place], [place]          [phone]       [facility]    [facility] (for caregivers and family) [address]     [address] [place], [place]     [place], [place] [phone]      [phone]  Residential Treatment Programs  [facility]  [facility](2 years) Residential: 90 days)  [facility] [address]      [address] [place], [place]     [place], [place] [phone]      [phone] or [phone]  [facility]    [facility] [address]      [address] [place], [place]     [place], [place] [phone]      [phone]  [facility]   [facility] ([facility] [address]     [address] [place], [place] [address]     [place], [place] [phone]      [phone] Admissions: 8am-3pm M-F  [facility]: Residential Program (90 Days)              [facility]: [facility]  [place], [place]     [place],   [phone] or [phone]    (Walk in Hours over the weekend or by referral)   [facility]: [facility]:[phone] (for crisis  response 24 hours a day) 

## 2022-05-16 NOTE — Evaluation (Signed)
Occupational Therapy Evaluation Patient Details Name: Eddie Mendoza MRN: [id number] DOB: 1950-04-30 Today's Date: 05/16/2022   History of Present Illness 72 y.o. male with a past medical history as listed below including Tiernan's urethral resection of bladder tumor in 2022, hematuria, anemia, BPH, substance abuse, hypertension and others, who presented to the ED on 05/11/2022 for jaundice and weight loss. CTAP with contrast showed question development of acute pancreatitis, proximal pancreatic cystic mass, urinary bladder mass, prostatic megaly, and retroperitoneal lymphadenopathy. 2/23 ERCP   Clinical Impression   Pt presents without any decline in function or safety with ADLs and ADL mobility . Pt is Ind with selfcare and mobilizes without an AD, no LOB. All education completed and no further acute OT services are indicated at this time. OT will sign off     Recommendations for follow up therapy are one component of a multi-disciplinary discharge planning process, led by the attending physician.  Recommendations may be updated based on patient status, additional functional criteria and insurance authorization.   Follow Up Recommendations  No OT follow up     Assistance Recommended at Discharge None  Patient can return home with the following      Functional Status Assessment  Patient has not had a recent decline in their functional status  Equipment Recommendations  None recommended by OT    Recommendations for Other Services       Precautions / Restrictions Precautions Precautions: Fall Restrictions Weight Bearing Restrictions: No      Mobility Bed Mobility Overal bed mobility: Independent                  Transfers Overall transfer level: Independent Equipment used: None                      Balance Overall balance assessment: Mild deficits observed, not formally tested                                         ADL either  performed or assessed with clinical judgement   ADL Overall ADL's : Independent                                             Vision Ability to See in Adequate Light: 0 Adequate Patient Visual Report: No change from baseline       Perception     Praxis      Pertinent Vitals/Pain Pain Assessment Pain Assessment: No/denies pain     Hand Dominance Right   Extremity/Trunk Assessment Upper Extremity Assessment Upper Extremity Assessment: Overall WFL for tasks assessed   Lower Extremity Assessment Lower Extremity Assessment: Defer to PT evaluation   Cervical / Trunk Assessment Cervical / Trunk Assessment: Normal   Communication Communication Communication: No difficulties   Cognition Arousal/Alertness: Awake/alert Behavior During Therapy: WFL for tasks assessed/performed Overall Cognitive Status: Within Functional Limits for tasks assessed                                       General Comments       Exercises     Shoulder Instructions      Home Living Family/patient expects to be discharged  to:: Private residence Living Arrangements: Spouse/significant other Available Help at Discharge: Family Type of Home: House Home Access: Stairs to enter Technical brewer of Steps: 3 Entrance Stairs-Rails: Right Home Layout: Two level;Able to live on main level with bedroom/bathroom     Bathroom Shower/Tub: Tub/shower unit   Bathroom Toilet: Handicapped height     Home Equipment: Conservation officer, nature (2 wheels);Grab bars - toilet          Prior Functioning/Environment Prior Level of Function : Independent/Modified Independent             Mobility Comments: walked without a device ADLs Comments: Ind        OT Problem List: Decreased activity tolerance      OT Treatment/Interventions:      OT Goals(Current goals can be found in the care plan section) Acute Rehab OT Goals Patient Stated Goal: go home OT Goal  Formulation: With patient  OT Frequency:      Co-evaluation              AM-PAC OT "6 Clicks" Daily Activity     Outcome Measure Help from another person eating meals?: None Help from another person taking care of personal grooming?: None Help from another person toileting, which includes using toliet, bedpan, or urinal?: None Help from another person bathing (including washing, rinsing, drying)?: None Help from another person to put on and taking off regular upper body clothing?: None Help from another person to put on and taking off regular lower body clothing?: None 6 Click Score: 24   End of Session    Activity Tolerance: Patient tolerated treatment well Patient left: in bed;with nursing/sitter in room  OT Visit Diagnosis: Muscle weakness (generalized) (M62.81)                Time: [id number] OT Time Calculation (min): 17 min Charges:  OT General Charges $OT Visit: 1 Visit OT Evaluation $OT Eval Low Complexity: 1 Low   Britt Bottom 05/16/2022, 1:17 PM

## 2022-05-17 ENCOUNTER — Other Ambulatory Visit: Payer: Self-pay

## 2022-05-17 DIAGNOSIS — D62 Acute posthemorrhagic anemia: Secondary | ICD-10-CM

## 2022-05-17 DIAGNOSIS — D649 Anemia, unspecified: Secondary | ICD-10-CM | POA: Diagnosis not present

## 2022-05-17 DIAGNOSIS — R7401 Elevation of levels of liver transaminase levels: Secondary | ICD-10-CM

## 2022-05-17 DIAGNOSIS — K831 Obstruction of bile duct: Secondary | ICD-10-CM | POA: Diagnosis not present

## 2022-05-17 DIAGNOSIS — R748 Abnormal levels of other serum enzymes: Secondary | ICD-10-CM | POA: Diagnosis not present

## 2022-05-17 DIAGNOSIS — C801 Malignant (primary) neoplasm, unspecified: Secondary | ICD-10-CM

## 2022-05-17 LAB — COMPREHENSIVE METABOLIC PANEL
ALT: 51 U/L — ABNORMAL HIGH (ref 0–44)
AST: 50 U/L — ABNORMAL HIGH (ref 15–41)
Albumin: <1.5 g/dL — ABNORMAL LOW (ref 3.5–5.0)
Alkaline Phosphatase: 289 U/L — ABNORMAL HIGH (ref 38–126)
Anion gap: 13 (ref 5–15)
BUN: 18 mg/dL (ref 8–23)
CO2: 20 mmol/L — ABNORMAL LOW (ref 22–32)
Calcium: 8 mg/dL — ABNORMAL LOW (ref 8.9–10.3)
Chloride: 103 mmol/L (ref 98–111)
Creatinine, Ser: 1.17 mg/dL (ref 0.61–1.24)
GFR, Estimated: >60 mL/min (ref >60)
Glucose, Bld: 128 mg/dL — ABNORMAL HIGH (ref 70–99)
Potassium: 3 mmol/L — ABNORMAL LOW (ref 3.5–5.1)
Sodium: 136 mmol/L (ref 135–145)
Total Bilirubin: 16.9 mg/dL — ABNORMAL HIGH (ref 0.3–1.2)
Total Protein: 5.2 g/dL — ABNORMAL LOW (ref 6.5–8.1)

## 2022-05-17 LAB — URINALYSIS, ROUTINE W REFLEX MICROSCOPIC
Glucose, UA: 100 mg/dL — AB
Ketones, ur: NEGATIVE mg/dL
Leukocytes,Ua: NEGATIVE
Nitrite: NEGATIVE
Protein, ur: 30 mg/dL — AB
Specific Gravity, Urine: 1.015 (ref 1.005–1.030)
pH: 6.5 (ref 5.0–8.0)

## 2022-05-17 LAB — CBC WITH DIFFERENTIAL/PLATELET
Abs Immature Granulocytes: 0 K/uL (ref 0.00–0.07)
Basophils Absolute: 0 K/uL (ref 0.0–0.1)
Basophils Relative: 0 %
Eosinophils Absolute: 0.1 K/uL (ref 0.0–0.5)
Eosinophils Relative: 1 %
HCT: 19.8 % — ABNORMAL LOW (ref 39.0–52.0)
Hemoglobin: 7.2 g/dL — ABNORMAL LOW (ref 13.0–17.0)
Lymphocytes Relative: 0 %
Lymphs Abs: 0 K/uL — ABNORMAL LOW (ref 0.7–4.0)
MCH: 31.4 pg (ref 26.0–34.0)
MCHC: 36.4 g/dL — ABNORMAL HIGH (ref 30.0–36.0)
MCV: 86.5 fL (ref 80.0–100.0)
Monocytes Absolute: 0.7 K/uL (ref 0.1–1.0)
Monocytes Relative: 5 %
Neutro Abs: 13.9 K/uL — ABNORMAL HIGH (ref 1.7–7.7)
Neutrophils Relative %: 94 %
Platelets: 382 K/uL (ref 150–400)
RBC: 2.29 MIL/uL — ABNORMAL LOW (ref 4.22–5.81)
RDW: 22.9 % — ABNORMAL HIGH (ref 11.5–15.5)
WBC: 14.8 K/uL — ABNORMAL HIGH (ref 4.0–10.5)
nRBC: 0 % (ref 0.0–0.2)
nRBC: 0 /100{WBCs}

## 2022-05-17 LAB — URINALYSIS, MICROSCOPIC (REFLEX)

## 2022-05-17 LAB — SURGICAL PATHOLOGY

## 2022-05-17 LAB — GLUCOSE, CAPILLARY: Glucose-Capillary: 109 mg/dL — ABNORMAL HIGH (ref 70–99)

## 2022-05-17 MED ORDER — CIPROFLOXACIN HCL 500 MG PO TABS: 500.0000 mg | ORAL_TABLET | Freq: Two times a day (BID) | ORAL | 0 refills | Status: AC

## 2022-05-17 MED ORDER — VITAMIN B-1 100 MG PO TABS: 100.0000 mg | ORAL_TABLET | Freq: Every day | ORAL | 0 refills | Status: DC

## 2022-05-17 MED ORDER — FOLIC ACID 1 MG PO TABS: 1.0000 mg | ORAL_TABLET | Freq: Every day | ORAL | 0 refills | Status: DC

## 2022-05-17 MED ORDER — FUROSEMIDE 40 MG PO TABS: 40.0000 mg | ORAL_TABLET | Freq: Every day | ORAL | 0 refills | Status: DC

## 2022-05-17 MED ORDER — FAMOTIDINE 40 MG PO TABS: 40.0000 mg | ORAL_TABLET | Freq: Every day | ORAL | Status: DC

## 2022-05-17 MED ORDER — SODIUM BICARBONATE 650 MG PO TABS: 650.0000 mg | ORAL_TABLET | Freq: Two times a day (BID) | ORAL | 0 refills | Status: AC

## 2022-05-17 NOTE — Progress Notes (Signed)
Patient ID: Eddie Mendoza, male   DOB: 1951-01-13, 72 y.o.   MRN: [id number]    Progress Note   Subjective   Day # 6  CC; jaundice, metastatic bladder cancer  Labs-WBC improved at 14.8/hemoglobin 7.2/hematocrit 19.8 K 3.0 T. bili 16.9/alk phos 289/AST 50/ALT 51-doing down  Biopsy and cytology pending  Patient says he feels fine has been tolerating solid food without difficulty no complaints of abdominal pain  TMax 99.9 last p.m.   Objective   Vital signs in last 24 hours: Temp:  [98.2 F (36.8 C)-99.9 F (37.7 C)] 98.7 F (37.1 C) (02/27 0711) Pulse Rate:  [80-96] 80 (02/27 0711) Resp:  [16-18] 16 (02/27 0711) BP: (117-144)/(56-73) 130/61 (02/27 0711) SpO2:  [99 %-100 %] 100 % (02/27 0711) Last BM Date : 05/14/22 General:    Elderly African-American male in NAD, scleral icterus Heart:  Regular rate and rhythm; no murmurs Lungs: Respirations even and unlabored, lungs CTA bilaterally Abdomen:  Soft, nontender and nondistended. Normal bowel sounds. Extremities:  Without edema. Neurologic:  Alert and oriented,  grossly normal neurologically. Psych:  Cooperative. Normal mood and affect.  Intake/Output from previous day: 02/26 0701 - 02/27 0700 In: 0.5 [IV Piggyback:0.5] Out: -  Intake/Output this shift: No intake/output data recorded.  Lab Results: Recent Labs    05/15/22 0622 05/16/22 0224 05/17/22 0324  WBC 22.5* 19.0* 14.8*  HGB 8.4* 7.5* 7.2*  HCT 22.7* 20.9* 19.8*  PLT 427* 374 382   BMET Recent Labs    05/15/22 0622 05/16/22 0224 05/17/22 0324  NA 136 133* 136  K 3.9 3.4* 3.0*  CL 108 106 103  CO2 16* 17* 20*  GLUCOSE 92 105* 128*  BUN '15 18 18  '$ CREATININE 0.80 1.09 1.17  CALCIUM 8.1* 7.9* 8.0*   LFT Recent Labs    05/17/22 0324  PROT 5.2*  ALBUMIN <1.5*  AST 50*  ALT 51*  ALKPHOS 289*  BILITOT 16.9*   PT/INR No results for input(s): "LABPROT", "INR" in the last 72 hours.  Studies/Results: VAS [place] LOWER EXTREMITY VENOUS  (DVT)  Result Date: 05/16/2022  Lower Venous DVT Study Patient Name:  Eddie Mendoza  Date of Exam:   05/16/2022 Medical Rec #: [id number]         Accession #:    [id number] Date of Birth: 24-Jun-1950          Patient Gender: M Patient Age:   11 years Exam Location:  [facility] Procedure:      VAS [place] LOWER EXTREMITY VENOUS (DVT) Referring Phys: Bonnielee Haff --------------------------------------------------------------------------------  Indications: Edema.  Limitations: Poor ultrasound/tissue interface. Comparison Study: No previous exams Performing Technologist: Jody Hill RVT, RDMS  Examination Guidelines: A complete evaluation includes B-mode imaging, spectral Doppler, color Doppler, and power Doppler as needed of all accessible portions of each vessel. Bilateral testing is considered an integral part of a complete examination. Limited examinations for reoccurring indications may be performed as noted. The reflux portion of the exam is performed with the patient in reverse Trendelenburg.  +---------+---------------+---------+-----------+----------+--------------+ RIGHT    CompressibilityPhasicitySpontaneityPropertiesThrombus Aging +---------+---------------+---------+-----------+----------+--------------+ CFV      Full           No       Yes                                 +---------+---------------+---------+-----------+----------+--------------+ SFJ      Full                                                        +---------+---------------+---------+-----------+----------+--------------+  FV Prox  Full           Yes      Yes                                 +---------+---------------+---------+-----------+----------+--------------+ FV Mid   Full           Yes      Yes                                 +---------+---------------+---------+-----------+----------+--------------+ FV DistalFull           Yes      Yes                                  +---------+---------------+---------+-----------+----------+--------------+ PFV      Full                                                        +---------+---------------+---------+-----------+----------+--------------+ POP      Full           Yes      Yes                                 +---------+---------------+---------+-----------+----------+--------------+ PTV      Full                                                        +---------+---------------+---------+-----------+----------+--------------+ PERO     Full                                                        +---------+---------------+---------+-----------+----------+--------------+   +---------+---------------+---------+-----------+----------+--------------+ LEFT     CompressibilityPhasicitySpontaneityPropertiesThrombus Aging +---------+---------------+---------+-----------+----------+--------------+ CFV      Full           Yes      Yes                                 +---------+---------------+---------+-----------+----------+--------------+ SFJ      Full                                                        +---------+---------------+---------+-----------+----------+--------------+ FV Prox  Full           Yes      Yes                                 +---------+---------------+---------+-----------+----------+--------------+ FV Mid   Full  Yes      Yes                                 +---------+---------------+---------+-----------+----------+--------------+ FV DistalFull           Yes      Yes                                 +---------+---------------+---------+-----------+----------+--------------+ PFV      Full                                                        +---------+---------------+---------+-----------+----------+--------------+ POP      Full           Yes      Yes                                  +---------+---------------+---------+-----------+----------+--------------+ PTV      Full                                                        +---------+---------------+---------+-----------+----------+--------------+ PERO     Full                                                        +---------+---------------+---------+-----------+----------+--------------+     Summary: BILATERAL: - No evidence of deep vein thrombosis seen in the lower extremities, bilaterally. -No evidence of popliteal cyst, bilaterally.   *See table(s) above for measurements and observations. Electronically signed by Servando Snare MD on 05/16/2022 at 5:18:19 PM.    Final    CT ABDOMEN PELVIS W CONTRAST  Result Date: 05/16/2022 CLINICAL DATA:  Biliary stent deployment with elevated white count, difficult ERCP. EXAM: CT ABDOMEN AND PELVIS WITH CONTRAST TECHNIQUE: Multidetector CT imaging of the abdomen and pelvis was performed using the standard protocol following bolus administration of intravenous contrast. RADIATION DOSE REDUCTION: This exam was performed according to the departmental dose-optimization program which includes automated exposure control, adjustment of the mA and/or kV according to patient size and/or use of iterative reconstruction technique. CONTRAST:  68m OMNIPAQUE IOHEXOL 350 MG/ML SOLN COMPARISON:  CT with IV contrast 05/11/2022, CT with IV contrast 03/21/2022. FINDINGS: Lower chest: No acute abnormality. Hepatobiliary: Respiratory motion limits image quality as well as artifact from the patient's arms in the field. Gallbladder is fluid-filled but again not dilated. Ill-defined thickening in the gallbladder neck is again noted on 3: 30-33. Similar to the last CT there is severe intrahepatic biliary dilatation, with no appreciable improvement despite interval stent placement from the intrahepatic ductal confluence through the CBD into the duodenum. A short wire or small caliber stent extends from the CBD  stent into the proximal pancreatic duct. There is scattered air in the dilated bile ducts which could be due to the ERCP and  stent placement or infectious process. No liver mass or hepatic abscess is seen. Pancreas: Allowing for respiratory motion there is probably increased proximal peripancreatic edema, concerning for pancreatitis. A short wire or in proximal pancreatic duct is noted. There is no downstream ductal dilatation. 3 x 2.5 cm cystic lesion again is noted of the pancreatic head. Spleen: Mildly prominent measuring 14.1 cm coronal, previously 13.5 cm coronal. No mass or abscess. Adrenals/Urinary Tract: No adrenal or renal mass is seen, no urinary stone or obstruction. There is symmetric delayed phase renal excretion. There is a large heterogeneous mass redemonstrated in the bladder. There is elsewhere mild generalized thickening of the bladder. Stomach/Bowel: No bowel obstruction or inflammatory change is seen. The appendix is normal. Vascular/Lymphatic: Stable retroperitoneal adenopathy, largest index lymph node in the left periaortic chain again measuring 2 cm in short axis. Aortic atherosclerosis. Reproductive: Severe prostatomegaly. Other: Mild free ascites in the abdomen and pelvis.  No free air. Musculoskeletal: No new findings. IMPRESSION: 1. Similar to the last CT there is severe intrahepatic biliary dilatation, but with interval stent placement from the intrahepatic ductal confluence through the CBD into the duodenum. There is scattered air in the dilated bile ducts which could be due to the ERCP and stent placement or infectious process. Ill-defined thickening in the gallbladder neck appears similar. 2. Allowing for respiratory motion, there is probably increased proximal peripancreatic edema, concerning for pancreatitis. 3. 3 x 2.5 cm cystic lesion of the pancreatic head, unchanged. 4. Large heterogeneous bladder mass redemonstrated. 5. Mild free fluid in the abdomen and pelvis. 6. Severe  prostatomegaly. 7. Stable retroperitoneal adenopathy. 8. Aortic atherosclerosis. Aortic Atherosclerosis (ICD10-I70.0). Electronically Signed   By: Telford Nab M.D.   On: 05/16/2022 02:56       Assessment / Plan:    #99 72 year old African-American male who presented with jaundice, in setting of known bladder cancer, had also had a weight loss of about 20 pounds over the past 3 weeks  Up-to-date most consistent with metastatic bladder cancer, he was found on imaging to have severe intra and extrahepatic biliary ductal dilation. ERCP 4 days ago with finding of a long common bile duct stricture, malignant appearing-He underwent temporary plastic stent placement in the pancreatic duct and placement of a 10 French/9 cm plastic stent into the common bile duct  He has been stable postprocedure but has had a persistent leukocytosis up to 19,000 yesterday, improved to 14,000 today Significant jaundice with T. bili of 18,.1 yesterday> 16.9 today- gradually trending down with gradual improvement in transaminases.  He did have low-grade fever to 99.9 last night, afebrile today.  Cytology from bile duct brushings still pending Pancreatic cyst aspiration path also still pending  #2 EtOH use disorder #3 ascites-probably malignant  Plan; I think he should be okay for discharge today, but would continue antibiotics for another 5 days, can discharge on Cipro 500 twice daily Arrange for him to have outpatient labs at our office/[facility] on [address] to Monday Dr. Rush Landmark is arranging for stent replacement in 1 month. If patient has development of any abdominal pain, nausea vomiting or temp 101 or above he would need to return to the emergency room.   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

## 2022-05-17 NOTE — Discharge Summary (Signed)
Physician Discharge Summary  Patient: Eddie Mendoza [id number] DOB: 06-Apr-1950   Code Status: Full Code Admit date: 05/11/2022 Discharge date: 05/17/2022 Disposition: Home, No home health services recommended PCP: [facility]  Recommendations for Outpatient Follow-up:  Follow up with PCP within 1-2 weeks Regarding general hospital follow up and preventative care Recommend following liver labs (CBC, INR, bili, transaminases) Follow up with urology for bladder mass biopsy Follow up with GI Regarding liver lab monitoring, stent revision   Discharge Diagnoses:  Principal Problem:   Elevated liver enzymes Active Problems:   Hematuria   Essential hypertension   Acute on chronic blood loss anemia   Hyponatremia   Normal anion gap metabolic acidosis   Alcohol use disorder   Pancreatic mass   Bladder mass   Enlarged prostate   Obstructive jaundice   Leucocytosis   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  Seen in the ED on 03/21/2022 for abdominal pain and hematuria.  CT was concerning for bladder neoplasm, gallbladder/biliary neoplasm, cystic lesions in the pancreatic head, and metastatic disease.  Patient refused hospitalization and left AMA at that time.    In ED, stable vitals.  Hgb 8.2 (9.6 on 1/24).  BUN 18.  AST 50.  ALT 59.  Total bili 18.4.  Lipase and INR normal.  CT with contrast concerning for questionable acute pancreatitis, 2.9 cm proximal pancreatic mass with interval worsening of severe intra and extrahepatic biliary ductal dilation concerning for hepatobiliary or pancreatic  metastatic malignancy, bladder/prostate mass, marked prostamegaly and heterogenicity of prostate gland, retroperitoneal and post Cabbell LAD and questionable diffuse colonic bowel thickening with no definite finding of colitis.   Urology was consulted and recommended outpatient biopsy of bladder mass.   GI was consulted and performed EUS and ERCP on 2/23. Showed significant common bile duct stricture. Stent was placed in the pancreatic duct and common bile duct. Biopsy from pancreatic cyst was collected at that time. He was given antibiotics for presumed cholangitis. Antibiotics were continued at discharge.  His elevated liver enzymes continued to gradually reduce but still elevated at time of dc.   He was able to tolerate a normal diet, stable vital signs and denies abdominal pain or urinary retention. He was discharged in stable condition for outpatient follow up.   Discharge Condition: Good, improved Recommended discharge diet: Regular healthy diet  Consultations: GI Urology   Procedures/Studies: EUS, ERCP  Discharge Instructions     Discharge patient   Complete by: As directed    Discharge disposition: 01-Home or Self Care   Discharge patient date: 05/17/2022      Allergies as of 05/17/2022   No Known Allergies      Medication List     STOP taking these medications    chlorthalidone 25 MG tablet Commonly known as: HYGROTON   DSS 100 MG Caps   losartan 100 MG tablet Commonly known as: COZAAR   ondansetron 4 MG tablet Commonly known as: ZOFRAN   oxyCODONE 5 MG immediate release tablet Commonly known as: Roxicodone   polyethylene glycol 17 g packet Commonly known as: MIRALAX / GLYCOLAX   predniSONE 20 MG tablet Commonly  known as: DELTASONE       TAKE these medications    cetirizine 10 MG tablet Commonly known as: ZYRTEC Take 10 mg by mouth at bedtime.   ciprofloxacin 500 MG tablet Commonly known as: Cipro Take 1 tablet (500 mg total) by mouth 2  (two) times daily for 5 days.   eucerin lotion Apply 1 Application topically as needed for dry skin (itching).   famotidine 40 MG tablet Commonly known as: PEPCID Take 1 tablet (40 mg total) by mouth daily. What changed: when to take this   ferrous sulfate 325 (65 FE) MG EC tablet Take 325 mg by mouth every Monday, Wednesday, and Friday.   finasteride 5 MG tablet Commonly known as: PROSCAR Take 1 tablet (5 mg total) by mouth daily.   folic acid 1 MG tablet Commonly known as: FOLVITE Take 1 tablet (1 mg total) by mouth daily.   furosemide 40 MG tablet Commonly known as: Lasix Take 1 tablet (40 mg total) by mouth daily.   hydrOXYzine 25 MG capsule Commonly known as: VISTARIL Take 25 mg by mouth every 6 (six) hours as needed for itching.   lidocaine 5 % ointment Commonly known as: XYLOCAINE Apply 1 application topically 4 (four) times daily as needed for mild pain or moderate pain.   sodium bicarbonate 650 MG tablet Take 1 tablet (650 mg total) by mouth 2 (two) times daily for 14 days.   tamsulosin 0.4 MG Caps capsule Commonly known as: Flomax Take 1 capsule (0.4 mg total) by mouth daily.   thiamine 100 MG tablet Commonly known as: Vitamin B-1 Take 1 tablet (100 mg total) by mouth daily.   triamcinolone ointment 0.1 % Commonly known as: KENALOG Apply 1 Application topically 2 (two) times daily.        Subjective   Pt reports feeling well. Denies abdominal pain N/V/D. He is able to urinate without difficulty.   All questions and concerns were addressed at time of discharge.  Objective  Blood pressure 130/61, pulse 80, temperature 98.7 F (37.1 C), temperature source Oral, resp. rate 16, height '5\' 10"'$  (1.778 m), weight 99.8 kg, SpO2 100 %.   General: Pt is alert, awake, not in acute distress Cardiovascular: RRR, S1/S2 +, no rubs, no gallops Respiratory: CTA bilaterally, no wheezing, no rhonchi Abdominal: Soft, NT, ND, bowel sounds + Extremities: no edema, no  cyanosis  The results of significant diagnostics from this hospitalization (including imaging, microbiology, ancillary and laboratory) are listed below for reference.   Imaging studies: VAS [place] LOWER EXTREMITY VENOUS (DVT)  Result Date: 05/16/2022  Lower Venous DVT Study Patient Name:  JAKEVION KEMPH  Date of Exam:   05/16/2022 Medical Rec #: [id number]         Accession #:    [id number] Date of Birth: 1950-10-02          Patient Gender: M Patient Age:   29 years Exam Location:  [facility] Procedure:      VAS [place] LOWER EXTREMITY VENOUS (DVT) Referring Phys: Bonnielee Haff --------------------------------------------------------------------------------  Indications: Edema.  Limitations: Poor ultrasound/tissue interface. Comparison Study: No previous exams Performing Technologist: Jody Hill RVT, RDMS  Examination Guidelines: A complete evaluation includes B-mode imaging, spectral Doppler, color Doppler, and power Doppler as needed of all accessible portions of each vessel. Bilateral testing is considered an integral part of a complete examination. Limited examinations for reoccurring indications may be performed as noted. The reflux portion of the exam is performed with the patient in  reverse Trendelenburg.  +---------+---------------+---------+-----------+----------+--------------+ RIGHT    CompressibilityPhasicitySpontaneityPropertiesThrombus Aging +---------+---------------+---------+-----------+----------+--------------+ CFV      Full           No       Yes                                 +---------+---------------+---------+-----------+----------+--------------+ SFJ      Full                                                        +---------+---------------+---------+-----------+----------+--------------+ FV Prox  Full           Yes      Yes                                 +---------+---------------+---------+-----------+----------+--------------+ FV Mid   Full            Yes      Yes                                 +---------+---------------+---------+-----------+----------+--------------+ FV DistalFull           Yes      Yes                                 +---------+---------------+---------+-----------+----------+--------------+ PFV      Full                                                        +---------+---------------+---------+-----------+----------+--------------+ POP      Full           Yes      Yes                                 +---------+---------------+---------+-----------+----------+--------------+ PTV      Full                                                        +---------+---------------+---------+-----------+----------+--------------+ PERO     Full                                                        +---------+---------------+---------+-----------+----------+--------------+   +---------+---------------+---------+-----------+----------+--------------+ LEFT     CompressibilityPhasicitySpontaneityPropertiesThrombus Aging +---------+---------------+---------+-----------+----------+--------------+ CFV      Full           Yes      Yes                                 +---------+---------------+---------+-----------+----------+--------------+  SFJ      Full                                                        +---------+---------------+---------+-----------+----------+--------------+ FV Prox  Full           Yes      Yes                                 +---------+---------------+---------+-----------+----------+--------------+ FV Mid   Full           Yes      Yes                                 +---------+---------------+---------+-----------+----------+--------------+ FV DistalFull           Yes      Yes                                 +---------+---------------+---------+-----------+----------+--------------+ PFV      Full                                                         +---------+---------------+---------+-----------+----------+--------------+ POP      Full           Yes      Yes                                 +---------+---------------+---------+-----------+----------+--------------+ PTV      Full                                                        +---------+---------------+---------+-----------+----------+--------------+ PERO     Full                                                        +---------+---------------+---------+-----------+----------+--------------+     Summary: BILATERAL: - No evidence of deep vein thrombosis seen in the lower extremities, bilaterally. -No evidence of popliteal cyst, bilaterally.   *See table(s) above for measurements and observations. Electronically signed by Servando Snare MD on 05/16/2022 at 5:18:19 PM.    Final    CT ABDOMEN PELVIS W CONTRAST  Result Date: 05/16/2022 CLINICAL DATA:  Biliary stent deployment with elevated white count, difficult ERCP. EXAM: CT ABDOMEN AND PELVIS WITH CONTRAST TECHNIQUE: Multidetector CT imaging of the abdomen and pelvis was performed using the standard protocol following bolus administration of intravenous contrast. RADIATION DOSE REDUCTION: This exam was performed according to the departmental dose-optimization program which includes automated exposure control, adjustment of the mA and/or kV according to patient size and/or  use of iterative reconstruction technique. CONTRAST:  21m OMNIPAQUE IOHEXOL 350 MG/ML SOLN COMPARISON:  CT with IV contrast 05/11/2022, CT with IV contrast 03/21/2022. FINDINGS: Lower chest: No acute abnormality. Hepatobiliary: Respiratory motion limits image quality as well as artifact from the patient's arms in the field. Gallbladder is fluid-filled but again not dilated. Ill-defined thickening in the gallbladder neck is again noted on 3: 30-33. Similar to the last CT there is severe intrahepatic biliary dilatation, with no appreciable improvement despite  interval stent placement from the intrahepatic ductal confluence through the CBD into the duodenum. A short wire or small caliber stent extends from the CBD stent into the proximal pancreatic duct. There is scattered air in the dilated bile ducts which could be due to the ERCP and stent placement or infectious process. No liver mass or hepatic abscess is seen. Pancreas: Allowing for respiratory motion there is probably increased proximal peripancreatic edema, concerning for pancreatitis. A short wire or in proximal pancreatic duct is noted. There is no downstream ductal dilatation. 3 x 2.5 cm cystic lesion again is noted of the pancreatic head. Spleen: Mildly prominent measuring 14.1 cm coronal, previously 13.5 cm coronal. No mass or abscess. Adrenals/Urinary Tract: No adrenal or renal mass is seen, no urinary stone or obstruction. There is symmetric delayed phase renal excretion. There is a large heterogeneous mass redemonstrated in the bladder. There is elsewhere mild generalized thickening of the bladder. Stomach/Bowel: No bowel obstruction or inflammatory change is seen. The appendix is normal. Vascular/Lymphatic: Stable retroperitoneal adenopathy, largest index lymph node in the left periaortic chain again measuring 2 cm in short axis. Aortic atherosclerosis. Reproductive: Severe prostatomegaly. Other: Mild free ascites in the abdomen and pelvis.  No free air. Musculoskeletal: No new findings. IMPRESSION: 1. Similar to the last CT there is severe intrahepatic biliary dilatation, but with interval stent placement from the intrahepatic ductal confluence through the CBD into the duodenum. There is scattered air in the dilated bile ducts which could be due to the ERCP and stent placement or infectious process. Ill-defined thickening in the gallbladder neck appears similar. 2. Allowing for respiratory motion, there is probably increased proximal peripancreatic edema, concerning for pancreatitis. 3. 3 x 2.5 cm cystic  lesion of the pancreatic head, unchanged. 4. Large heterogeneous bladder mass redemonstrated. 5. Mild free fluid in the abdomen and pelvis. 6. Severe prostatomegaly. 7. Stable retroperitoneal adenopathy. 8. Aortic atherosclerosis. Aortic Atherosclerosis (ICD10-I70.0). Electronically Signed   By: KTelford NabM.D.   On: 05/16/2022 02:56   DG ERCP  Result Date: 05/14/2022 CLINICAL DATA:  72year old male with history of biliary obstruction. EXAM: ERCP TECHNIQUE: Multiple spot images obtained with the fluoroscopic device and submitted for interpretation post-procedure. FLUOROSCOPY TIME:  81.7 mGy COMPARISON:  CT abdomen pelvis from 05/11/2022 FINDINGS: Retrograde cannulation of the common bile duct into the intrahepatic ducts and cholangiogram demonstrates severe intrahepatic biliary duct dilation with obstructive hilar mass. A plastic biliary stent is placed. IMPRESSION: 1. Severe biliary obstruction at the level of the hilum. 2. Common bile duct stent was placed. These images were submitted for radiologic interpretation only. Please see the procedural report for the full procedural details, amount of contrast, and the fluoroscopy time utilized. DRuthann Cancer MD [facility] Electronically Signed   By: DRuthann CancerM.D.   On: 05/14/2022 08:06   CT ABDOMEN PELVIS W CONTRAST  Result Date: 05/12/2022 CLINICAL DATA:  Biliary obstruction suspected (Ped 0-17y) EXAM: CT ABDOMEN AND PELVIS WITH CONTRAST TECHNIQUE: Multidetector  CT imaging of the abdomen and pelvis was performed using the standard protocol following bolus administration of intravenous contrast. RADIATION DOSE REDUCTION: This exam was performed according to the departmental dose-optimization program which includes automated exposure control, adjustment of the mA and/or kV according to patient size and/or use of iterative reconstruction technique. CONTRAST:  47m OMNIPAQUE IOHEXOL 350 MG/ML  SOLN COMPARISON:  CT abdomen pelvis 03/21/2022 FINDINGS: Lower chest: No acute abnormality. Hepatobiliary: No focal liver abnormality. Poorly visualized gallbladder with persistent gallbladder wall thickening around the neck (3:24). Interval worsening of severe intra and extrahepatic biliary ductal dilatation. Pancreas: Persistent stable 2.9 x 2.4 cm proximal pancreatic duct hypodense lesion. Slightly hazy pancreatic contour proximally with trace fat stranding. No main pancreatic ductal dilatation. Spleen: Normal in size without focal abnormality. Adrenals/Urinary Tract: No adrenal nodule bilaterally. Bilateral kidneys enhance symmetrically. No hydronephrosis. No hydroureter. Redemonstration of a heterogeneous masslike intraluminal lesion within the urinary bladder. Persistent urine bladder wall thickening. On delayed imaging, there is no urothelial wall thickening and there are no filling defects in the opacified portions of the bilateral collecting systems or ureters. Stomach/Bowel: Stomach is within normal limits. No evidence of small bowel wall thickening or dilatation. No large bowel dilatation. Diffuse large bowel wall thickening. No pericolonic fat stranding. Scattered colonic diverticula. Appendix appears normal. Vascular/Lymphatic: No abdominal aorta or iliac aneurysm. Mild atherosclerotic plaque of the aorta and its branches. Persistent retroperitoneal lymphadenopathy as an example a 1.8 cm left periaortic lymph node (3:34). Difficult to delineate suggestion of portacaval lymphadenopathy (3:18). Reproductive: The prostate is enlarged and heterogeneous measuring up to 7.3 cm. Other: Interval increase in trace simple fluid ascites. No intraperitoneal free gas. No organized fluid collection. Musculoskeletal: No abdominal wall hernia or abnormality. No suspicious lytic or blastic osseous lesions. No acute displaced fracture. Multilevel degenerative changes of the spine. IMPRESSION: 1. Question development of  acute pancreatitis. Correlate with lipase levels. 2. Proximal pancreatic 2.9 cm cystic mass with interval worsening of severe intra and extrahepatic biliary ductal dilatation. Persistent poorly visualized gallbladder with gallbladder neck thickening. 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 When the patient is clinically stable and able to follow directions and hold their breath (preferably as an outpatient) further evaluation with dedicated MRI with and without contrast should be considered. 3. Persistent heterogeneous masslike intraluminal lesion within the urinary bladder. Finding could represent a urinary bladder or prostatic mass versus blood products. Recommend urologic consultation. 4. Marked Prostatomegaly and heterogeneity of the prostate gland. 5. Retroperitoneal and likely portacaval lymphadenopathy. 6. Colonic diverticulosis with no acute diverticulitis. 7. Question diffuse colonic bowel thickening with no definite findings of colitis. 8. Trace simple rib fluid ascites. Electronically Signed   By: MIven FinnM.D.   On: 05/12/2022 00:14    Labs: Basic Metabolic Panel: Recent Labs  Lab 05/13/22 0407 05/14/22 0303 05/15/22 0622 05/16/22 0224 05/17/22 0324  NA 134* 136 136 133* 136  K 3.2* 3.4* 3.9 3.4* 3.0*  CL 109 109 108 106 103  CO2 17* 19* 16* 17* 20*  GLUCOSE 108* 114* 92 105* 128*  BUN '16 21 15 18 18  '$ CREATININE 0.90 1.08 0.80 1.09 1.17  CALCIUM 8.2* 8.2* 8.1* 7.9* 8.0*  MG 2.4 2.5*  --   --   --   PHOS 3.6 5.4*  --   --   --    CBC: Recent Labs  Lab 05/11/22 1655 05/12/22 0628 05/13/22 0407 05/14/22 0303 05/15/22 0622 05/16/22 0224 05/17/22 0324  WBC  12.5*   < > 10.2 10.6* 22.5* 19.0* 14.8*  NEUTROABS 10.1*  --   --   --   --  16.9* 13.9*  HGB 8.2*   < > 8.1* 8.1* 8.4* 7.5* 7.2*  HCT 23.1*   < > 22.3* 22.1* 22.7* 20.9* 19.8*  MCV 87.8   < > 87.8 87.7 85.3 87.4 86.5  PLT 479*   < > 481* 426* 427* 374 382    < > = values in this interval not displayed.   Microbiology: Results for orders placed or performed during the hospital encounter of 08/12/20  Resp Panel by RT-PCR (Flu A&B, Covid) Nasopharyngeal Swab     Status: None   Collection Time: 08/13/20  4:31 AM   Specimen: Nasopharyngeal Swab; Nasopharyngeal(NP) swabs in vial transport medium  Result Value Ref Range Status   SARS Coronavirus 2 by RT PCR NEGATIVE NEGATIVE Final    Comment: (NOTE) SARS-CoV-2 target nucleic acids are NOT DETECTED.  The SARS-CoV-2 RNA is generally detectable in upper respiratory specimens during the acute phase of infection. The lowest concentration of SARS-CoV-2 viral copies this assay can detect is 138 copies/mL. A negative result does not preclude SARS-Cov-2 infection and should not be used as the sole basis for treatment or other patient management decisions. A negative result may occur with  improper specimen collection/handling, submission of specimen other than nasopharyngeal swab, presence of viral mutation(s) within the areas targeted by this assay, and inadequate number of viral copies(<138 copies/mL). A negative result must be combined with clinical observations, patient history, and epidemiological information. The expected result is Negative.  Fact Sheet for Patients:  EntrepreneurPulse.com.au  Fact Sheet for Healthcare Providers:  IncredibleEmployment.be  This test is no t yet approved or cleared by the [place] FDA and  has been authorized for detection and/or diagnosis of SARS-CoV-2 by FDA under an Emergency Use Authorization (EUA). This EUA will remain  in effect (meaning this test can be used) for the duration of the COVID-19 declaration under Section 564(b)(1) of the Act, 21 U.S.C.section 360bbb-3(b)(1), unless the authorization is terminated  or revoked sooner.       Influenza A by PCR NEGATIVE NEGATIVE Final   Influenza B by PCR NEGATIVE  NEGATIVE Final    Comment: (NOTE) The Xpert Xpress SARS-CoV-2/FLU/RSV plus assay is intended as an aid in the diagnosis of influenza from Nasopharyngeal swab specimens and should not be used as a sole basis for treatment. Nasal washings and aspirates are unacceptable for Xpert Xpress SARS-CoV-2/FLU/RSV testing.  Fact Sheet for Patients: EntrepreneurPulse.com.au  Fact Sheet for Healthcare Providers: IncredibleEmployment.be  This test is not yet approved or cleared by the [place] FDA and has been authorized for detection and/or diagnosis of SARS-CoV-2 by FDA under an Emergency Use Authorization (EUA). This EUA will remain in effect (meaning this test can be used) for the duration of the COVID-19 declaration under Section 564(b)(1) of the Act, 21 U.S.C. section 360bbb-3(b)(1), unless the authorization is terminated or revoked.  Performed at [facility], [place] [address] [place], [place] [address]   Surgical PCR screen     Status: Abnormal   Collection Time: 08/13/20  9:11 AM   Specimen: Nasal Mucosa; Nasal Swab  Result Value Ref Range Status   MRSA, PCR NEGATIVE NEGATIVE Final   Staphylococcus aureus POSITIVE (A) NEGATIVE Final    Comment: (NOTE) The Xpert SA Assay (FDA approved for NASAL specimens in patients 9 years of age and older), is one component of  a comprehensive surveillance program. It is not intended to diagnose infection nor to guide or monitor treatment. Performed at [facility], [place] [address] [place], [place] [address]    Time coordinating discharge: Over 30 minutes  Richarda Osmond, MD  [facility] 05/17/2022, 1:39 PM

## 2022-05-17 NOTE — Progress Notes (Signed)
RN went over discharge instructions with the patient and his wife and they both stated understanding. IV has been removed medications have been escribed to the patients home pharmacy. Volunteers called for DC

## 2022-05-17 NOTE — Progress Notes (Incomplete)
PROGRESS NOTE  JEREME DETORO [id number] DOB: 1950-06-16   PCP: [facility]  Patient is from: Home  DOA: 05/11/2022 LOS: 5  Brief Narrative / Interim history: 72 year old M with PMH of BPH, TURBT in 2022, hematuria, anemia, HTN, EtOH and substance use and recent ED visit on 1/1 when he left AMA returning with jaundice, unintentional 20 pound weight loss and itching, and admitted for obstructive jaundice/hyperbilirubinemia, pancreatic cystic mass and bladder/prostate mass.  Seen in the ED on 03/21/2022 for abdominal pain and hematuria.  CT was concerning for bladder neoplasm, gallbladder/biliary neoplasm, cystic lesions in the pancreatic head, and metastatic disease.  Patient refused hospitalization and left AMA at that time.   In ED, stable vitals.  Hgb 8.2 (9.6 on 1/24).  BUN 18.  AST 50.  ALT 59.  Total bili 18.4.  Lipase and INR normal.  CT with contrast concerning for questionable acute pancreatitis, 2.9 cm proximal pancreatic mass with interval worsening of severe intra and extrahepatic biliary ductal dilation concerning for hepatobiliary or pancreatic metastatic malignancy, bladder/prostate mass, marked prostamegaly and heterogenicity of prostate gland, retroperitoneal and post Cabbell LAD and questionable diffuse colonic bowel thickening with no definite finding of colitis. ED PA discussed the case with Dr. Felipa Eth with urology who felt that the patient is currently not a surgical candidate due to his other medical issues.  He recommended outpatient follow-up with urology for biopsy of the bladder mass.  [facility] consulted.  Patient underwent EUS and ERCP on 2/23.  Subjective: Patient states he is feeling well. Continues to have some very mild discomfort in his abdomen. Has a BM this am. Denies difficulty with urination. He is addiment that he wants to go home today.    Objective: Vitals:   05/16/22 1606 05/16/22 1908 05/17/22 0123 05/17/22 0711  BP: 117/68 (!) 144/73  (!) 132/56 130/61  Pulse: 85 96 90 80  Resp: '17 18 16 16  '$ Temp: 98.2 F (36.8 C) 99.9 F (37.7 C)  98.7 F (37.1 C)  TempSrc: Oral Oral  Oral  SpO2: 100% 100% 99% 100%  Weight:      Height:        Examination:  General appearance: Awake alert.  In no distress.  Resp: Clear to auscultation bilaterally.  Normal effort Cardio: S1-S2 is normal regular.  No S3-S4.  No rubs murmurs or bruit GI: Abdomen is soft.  Nontender nondistended.  Bowel sounds are present normal.  No masses organomegaly Extremities: 3+ pitting edema bilaterally.  Moving all of his extremities.  Deconditioning is noted. Neurologic: Oriented to self, place, city, year. Motor strength equal bilateral upper and lower extremities.  Gait not assessed.   Procedures:  EUS 2/23 Impression:               EGD Impression:                           - No gross lesions in the entire esophagus. Z-line                            irregular, 41 cm from the incisors.                           - 1 cm hiatal hernia.                           -  Gastritis. Biopsied.                           - J-shaped Deformity in the gastric body.                           - Congested duodenal mucosa throughout.                           EUS Impression:                           - There was dilation in the middle third of the                            main bile duct which measured up to 12 mm.                           - A cystic lesion was seen in the pancreatic head.                            Cytology results are pending. 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                            Fine needle aspiration for fluid performed.                           - Pancreatic parenchymal abnormalities consisting                            of atrophy, lobularity with honeycombing and                            hyperechoic strands  were noted in the pancreatic                            head, genu of the pancreas, pancreatic body and                            pancreatic tail.                           - One heterogenous, distally enhancing lesion was                            found in the periduodenal peritoneal cavity. Fine                            needle biopsy performed. This is not clear if this  is a lymph node or other lesion.                           - A few cystic lesions were found in the visualized                            portion of the liver. No other concerning findings                            in the visualized liver.  ERCP 2/23 Impression:               - The major papilla appeared congested and small.                           - Very difficult cannulation requiring double wire                            technique.                           - The patient has a shared portion of the distal                            duct such that there was both pancreatic and                            biliary injection noted during initial cannulation.                           - Prophylactic pancreatic stent placed.                           - The biliary tree was swept and sludge was found.                           - A single severe biliary stricture was found in                            the middle third of the main bile duct. The                            stricture was malignant appearing. It was brushed x                            2. This stricture was then stented with one plastic                            stent.   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  Underwent EUS/ERCP.  Fine-needle aspiration of the cystic lesion in the pancreatic head was performed.  Pancreatic parenchymal  abnormalities were noted on EUS.  Distally enhancing lesion was found in the periduodenal peritoneal cavity.  A  biopsy was performed.  Biliary sludge was found.  Single severe biliary stricture was noted in the main bile duct.  It was brushed.  And then a stent was placed. Transaminases remain elevated and mildly improved today. Repeat abdominal CT overnight essentially unchanged.  - GI following, appreciate recs  - cipro '500mg'$  BID x3 days  - if no improvement in LFTs, may need repeat procedure - CMP am  Hematuria/possible bladder/prostate mass concerning for malignancy: Dr. Felipa Eth with urology recommended outpatient follow-up with urology for biopsy per ED PA and H&P Experienced retention previously.  Started back on his Flomax and Proscar.  Now urinating without issues  Acute encephalopathy Noted to be confused and distracted intermittently.  No focal neurological deficits noted.  Ammonia level was normal when recently checked.  Patient did get 2 doses of Ativan yesterday which could have contributed.  This has been discontinued since there is no evidence of alcohol withdrawal.  Continue to reorient daily.  No indication to do imaging studies at this time.  Acute on chronic blood loss anemia Hemoglobin low but stable.  Mild hyponatremia Stable.  Continue monitoring  Hypokalemia: Supplemented.  Magnesium was 2.5   Normal anion gap metabolic acidosis Bicarbonate level noted to be lower today.  - continue sodium bicarbonate.    BLE edema Could be due to malignancy, hypoalbuminemia and anemia. LE dopplers negative for DVT.  -Encourage leg elevation -Try TED hose - lasix daily   Alcohol use disorder Denied alcohol for several weeks.  However, per ED documentation, his wife reported ongoing use.  Continue thiamine and multivitamins.  Ativan discontinued due to confusion.   Essential hypertension Normotensive off home antihypertensive meds. -Continue holding losartan and thiazide  diuretics  Obesity: Estimated body mass index is 31.57 kg/m as calculated from the following:   Height as of this encounter: '5\' 10"'$  (1.778 m).   Weight as of this encounter: 99.8 kg.   DVT prophylaxis: SCDs Code Status: Full code Family Communication: wife on phone Disposition: home when cleared by GI  Status is: Inpatient Remains inpatient appropriate because: Abnormal LFTs, pancreatic mass, biliary stricture  Consultants:  Gastroenterology Urology  Sch Meds:  Scheduled Meds:  finasteride  5 mg Oral Daily   folic acid  1 mg Oral Daily   guaiFENesin  600 mg Oral BID   multivitamin with minerals  1 tablet Oral Q24H   sodium bicarbonate  650 mg Oral BID   tamsulosin  0.4 mg Oral Daily   thiamine  100 mg Oral Daily   Or   thiamine  100 mg Intravenous Daily    PRN Meds:.albuterol, haloperidol lactate  CBC: Recent Labs  Lab 05/11/22 1655 05/12/22 0628 05/13/22 0407 05/14/22 0303 05/15/22 0622 05/16/22 0224 05/17/22 0324  WBC 12.5*   < > 10.2 10.6* 22.5* 19.0* 14.8*  NEUTROABS 10.1*  --   --   --   --  16.9* 13.9*  HGB 8.2*   < > 8.1* 8.1* 8.4* 7.5* 7.2*  HCT 23.1*   < > 22.3* 22.1* 22.7* 20.9* 19.8*  MCV 87.8   < > 87.8 87.7 85.3 87.4 86.5  PLT 479*   < > 481* 426* 427* 374 382   < > = values in this interval not displayed.    BMP &GFR Recent Labs  Lab 05/13/22 0407 05/14/22 0303 05/15/22 0622 05/16/22 0224 05/17/22 0324  NA 134* 136 136 133* 136  K 3.2* 3.4* 3.9 3.4* 3.0*  CL 109 109 108 106 103  CO2  17* 19* 16* 17* 20*  GLUCOSE 108* 114* 92 105* 128*  BUN '16 21 15 18 18  '$ CREATININE 0.90 1.08 0.80 1.09 1.17  CALCIUM 8.2* 8.2* 8.1* 7.9* 8.0*  MG 2.4 2.5*  --   --   --   PHOS 3.6 5.4*  --   --   --     Estimated Creatinine Clearance: 67.6 mL/min (by C-G formula based on SCr of 1.17 mg/dL).  Liver & Pancreas: Recent Labs  Lab 05/13/22 0407 05/14/22 0303 05/15/22 0622 05/16/22 0224 05/17/22 0324  AST 52* 53* 75* 64* 50*  ALT 57* 53* 62*  57* 51*  ALKPHOS 360* 350* 370* 331* 289*  BILITOT 17.4* 17.4* 19.2* 18.4* 16.9*  PROT 5.7* 5.5* 5.4* 5.3* 5.2*  ALBUMIN 1.6* 1.5* 1.5* <1.5* <1.5*    Recent Labs  Lab 05/11/22 1655 05/12/22 0628 05/14/22 0303  LIPASE 41 33 44     Coagulation Profile: Recent Labs  Lab 05/12/22 0015 05/12/22 0628  INR 1.0 1.0    Radiology Studies: VAS [place] LOWER EXTREMITY VENOUS (DVT)  Result Date: 05/16/2022  Lower Venous DVT Study Patient Name:  JEM EASTBURN  Date of Exam:   05/16/2022 Medical Rec #: [id number]         Accession #:    [id number] Date of Birth: 08-09-1950          Patient Gender: M Patient Age:   55 years Exam Location:  [facility] Procedure:      VAS [place] LOWER EXTREMITY VENOUS (DVT) Referring Phys: Bonnielee Haff --------------------------------------------------------------------------------  Indications: Edema.  Limitations: Poor ultrasound/tissue interface. Comparison Study: No previous exams Performing Technologist: Jody Hill RVT, RDMS  Examination Guidelines: A complete evaluation includes B-mode imaging, spectral Doppler, color Doppler, and power Doppler as needed of all accessible portions of each vessel. Bilateral testing is considered an integral part of a complete examination. Limited examinations for reoccurring indications may be performed as noted. The reflux portion of the exam is performed with the patient in reverse Trendelenburg.  +---------+---------------+---------+-----------+----------+--------------+ RIGHT    CompressibilityPhasicitySpontaneityPropertiesThrombus Aging +---------+---------------+---------+-----------+----------+--------------+ CFV      Full           No       Yes                                 +---------+---------------+---------+-----------+----------+--------------+ SFJ      Full                                                        +---------+---------------+---------+-----------+----------+--------------+ FV Prox   Full           Yes      Yes                                 +---------+---------------+---------+-----------+----------+--------------+ FV Mid   Full           Yes      Yes                                 +---------+---------------+---------+-----------+----------+--------------+ FV DistalFull  Yes      Yes                                 +---------+---------------+---------+-----------+----------+--------------+ PFV      Full                                                        +---------+---------------+---------+-----------+----------+--------------+ POP      Full           Yes      Yes                                 +---------+---------------+---------+-----------+----------+--------------+ PTV      Full                                                        +---------+---------------+---------+-----------+----------+--------------+ PERO     Full                                                        +---------+---------------+---------+-----------+----------+--------------+   +---------+---------------+---------+-----------+----------+--------------+ LEFT     CompressibilityPhasicitySpontaneityPropertiesThrombus Aging +---------+---------------+---------+-----------+----------+--------------+ CFV      Full           Yes      Yes                                 +---------+---------------+---------+-----------+----------+--------------+ SFJ      Full                                                        +---------+---------------+---------+-----------+----------+--------------+ FV Prox  Full           Yes      Yes                                 +---------+---------------+---------+-----------+----------+--------------+ FV Mid   Full           Yes      Yes                                 +---------+---------------+---------+-----------+----------+--------------+ FV DistalFull           Yes      Yes                                  +---------+---------------+---------+-----------+----------+--------------+ PFV      Full                                                        +---------+---------------+---------+-----------+----------+--------------+  POP      Full           Yes      Yes                                 +---------+---------------+---------+-----------+----------+--------------+ PTV      Full                                                        +---------+---------------+---------+-----------+----------+--------------+ PERO     Full                                                        +---------+---------------+---------+-----------+----------+--------------+     Summary: BILATERAL: - No evidence of deep vein thrombosis seen in the lower extremities, bilaterally. -No evidence of popliteal cyst, bilaterally.   *See table(s) above for measurements and observations. Electronically signed by Servando Snare MD on 05/16/2022 at 5:18:19 PM.    Final     [facility]le[facility] Hospitalist  If 7PM-7AM, please contact night-coverage [email or web address] 05/17/2022, 8:14 AM

## 2022-05-17 NOTE — Addendum Note (Signed)
Addendum  created 05/17/22 1210 by Effie Berkshire, MD   Attestation recorded in [place], [place] filed

## 2022-05-18 ENCOUNTER — Encounter: Payer: Self-pay | Admitting: Gastroenterology

## 2022-05-18 LAB — CYTOLOGY - NON PAP

## 2022-05-19 LAB — CANCER ANTIGEN 19-9: CA 19-9: 2043 U/mL — ABNORMAL HIGH (ref 0–35)

## 2022-05-24 ENCOUNTER — Encounter: Payer: Self-pay | Admitting: Gastroenterology

## 2022-05-25 ENCOUNTER — Other Ambulatory Visit: Payer: Self-pay

## 2022-05-25 ENCOUNTER — Encounter: Payer: Self-pay | Admitting: Gastroenterology

## 2022-05-25 ENCOUNTER — Telehealth: Payer: Self-pay | Admitting: Gastroenterology

## 2022-05-25 DIAGNOSIS — C801 Malignant (primary) neoplasm, unspecified: Secondary | ICD-10-CM

## 2022-05-25 NOTE — Progress Notes (Signed)
Opened in error

## 2022-05-25 NOTE — Telephone Encounter (Signed)
Finally able to get a hold of the patient and patient's wife/spouse. Went over the results of his EUS and ERCP. Atypical cells found but no overt cancer found as of yet. They are aware of the elevated CA 19-9 that makes an underlying malignancy the most likely etiology for his symptoms. Patient is feeling extremely weak and weaker by the day compared to when he left the hospital. He still has significant jaundice of his eyes and significant darkened urine per the report of the patient and his wife. His eyes have also become bloodshot as well and there is concern of a potential infection. Patient has been having chills with low-grade fevers and progressive abdominal pain.  Our initial thoughts were for him to come in for laboratories to see where things stood in regards to the previous biliary stent and to have him scheduled for potential repeat ERCP with spyglass as an outpatient. I feel this is not going to be possible for him based on his current symptomatology.  I have asked the patient and patient's wife to come into the hospital.  I recommend [facility] since there is no ERCP capabilities at [facility] even though they live in [place].  The patient should have a full workup including blood counts liver biochemical testing and INR to evaluate for insufficiency of the liver or vitamin deficiencies since he has not been eating well either.  Patient should have updated cross-sectional imaging most likely a CT scan to evaluate the biliary stents and the biliary dilation.  This patient should be maintained in an n.p.o. status and unless his numbers are significantly normalizing I suspect he needs to be brought in for admission for dehydration, failure to thrive, and likely further/repeat endoscopic reevaluation to try and establish his diagnosis and optimize his biliary drainage.  If ERCP is not going to be helpful that he may end up needing percutaneous biliary drainage.  The  patient and wife agree with this plan of action.  I will put our inpatient GI team on this so that they are aware as well as the overnight GI team.  If there are further questions please reach out to on-call GI.   Eddie Britain, MD [facility] # [phone]

## 2022-05-25 NOTE — Progress Notes (Signed)
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  Molecular studies will be helpful as well as clinical and radiologic correlation. Epithelial cell atypia shows mild dysplasia Cellularity is moderate. Cell composition is mucinous epithelial cells, cuboidal epithelial cells, macrophages Mucin scant Proteinaceous debris present Inflammation absent   We will have these results scanned into the chart and if we have agreed to speak with the patient himself about things (see prior telephone notes which been difficult for [place] to reach him) that these findings are concerning though yet not diagnostic of cancer.  His significantly elevated CA 19-9, his obstructive jaundice component, his atypical cells being noted suggest that this is most likely a malignancy.  We need to see how his labs have hopefully improved with biliary stenting.  I have concerns that based on my last telephone call on 05/24/2022 with the patient's wife that he is doing poorly and he needs to return to the hospital though she states he is not wanting to return to the hospital even though he is doing very poorly.  I hope to have an opportunity to talk with him about this otherwise we will have to release these results to the patient via mail which is not ideal.  Justice Britain, MD [facility] # [phone]

## 2022-05-26 ENCOUNTER — Emergency Department (HOSPITAL_COMMUNITY): Payer: No Typology Code available for payment source

## 2022-05-26 ENCOUNTER — Inpatient Hospital Stay (HOSPITAL_COMMUNITY)
Admission: EM | Admit: 2022-05-26 | Discharge: 2022-06-20 | DRG: 871 | Disposition: E | Payer: No Typology Code available for payment source | Attending: Internal Medicine | Admitting: Internal Medicine

## 2022-05-26 ENCOUNTER — Other Ambulatory Visit: Payer: Self-pay

## 2022-05-26 ENCOUNTER — Encounter (HOSPITAL_COMMUNITY): Payer: Self-pay

## 2022-05-26 ENCOUNTER — Inpatient Hospital Stay (HOSPITAL_COMMUNITY): Payer: No Typology Code available for payment source

## 2022-05-26 DIAGNOSIS — R188 Other ascites: Secondary | ICD-10-CM | POA: Diagnosis present

## 2022-05-26 DIAGNOSIS — K750 Abscess of liver: Secondary | ICD-10-CM | POA: Diagnosis present

## 2022-05-26 DIAGNOSIS — F1020 Alcohol dependence, uncomplicated: Secondary | ICD-10-CM | POA: Diagnosis present

## 2022-05-26 DIAGNOSIS — E8809 Other disorders of plasma-protein metabolism, not elsewhere classified: Secondary | ICD-10-CM | POA: Diagnosis present

## 2022-05-26 DIAGNOSIS — K7200 Acute and subacute hepatic failure without coma: Secondary | ICD-10-CM | POA: Diagnosis present

## 2022-05-26 DIAGNOSIS — R7989 Other specified abnormal findings of blood chemistry: Secondary | ICD-10-CM | POA: Diagnosis not present

## 2022-05-26 DIAGNOSIS — R0681 Apnea, not elsewhere classified: Secondary | ICD-10-CM | POA: Diagnosis not present

## 2022-05-26 DIAGNOSIS — N179 Acute kidney failure, unspecified: Secondary | ICD-10-CM | POA: Diagnosis present

## 2022-05-26 DIAGNOSIS — I10 Essential (primary) hypertension: Secondary | ICD-10-CM | POA: Diagnosis present

## 2022-05-26 DIAGNOSIS — E43 Unspecified severe protein-calorie malnutrition: Secondary | ICD-10-CM | POA: Diagnosis present

## 2022-05-26 DIAGNOSIS — Z79899 Other long term (current) drug therapy: Secondary | ICD-10-CM

## 2022-05-26 DIAGNOSIS — R791 Abnormal coagulation profile: Secondary | ICD-10-CM | POA: Diagnosis present

## 2022-05-26 DIAGNOSIS — Z66 Do not resuscitate: Secondary | ICD-10-CM | POA: Diagnosis present

## 2022-05-26 DIAGNOSIS — C679 Malignant neoplasm of bladder, unspecified: Secondary | ICD-10-CM | POA: Diagnosis present

## 2022-05-26 DIAGNOSIS — K831 Obstruction of bile duct: Secondary | ICD-10-CM | POA: Diagnosis present

## 2022-05-26 DIAGNOSIS — E860 Dehydration: Secondary | ICD-10-CM | POA: Diagnosis present

## 2022-05-26 DIAGNOSIS — R935 Abnormal findings on diagnostic imaging of other abdominal regions, including retroperitoneum: Secondary | ICD-10-CM | POA: Diagnosis not present

## 2022-05-26 DIAGNOSIS — R627 Adult failure to thrive: Secondary | ICD-10-CM | POA: Diagnosis present

## 2022-05-26 DIAGNOSIS — R52 Pain, unspecified: Secondary | ICD-10-CM | POA: Diagnosis not present

## 2022-05-26 DIAGNOSIS — D638 Anemia in other chronic diseases classified elsewhere: Secondary | ICD-10-CM | POA: Diagnosis present

## 2022-05-26 DIAGNOSIS — I700 Atherosclerosis of aorta: Secondary | ICD-10-CM | POA: Diagnosis present

## 2022-05-26 DIAGNOSIS — R739 Hyperglycemia, unspecified: Secondary | ICD-10-CM | POA: Diagnosis present

## 2022-05-26 DIAGNOSIS — Z515 Encounter for palliative care: Secondary | ICD-10-CM

## 2022-05-26 DIAGNOSIS — C799 Secondary malignant neoplasm of unspecified site: Secondary | ICD-10-CM

## 2022-05-26 DIAGNOSIS — A408 Other streptococcal sepsis: Secondary | ICD-10-CM | POA: Diagnosis present

## 2022-05-26 DIAGNOSIS — C259 Malignant neoplasm of pancreas, unspecified: Secondary | ICD-10-CM | POA: Diagnosis present

## 2022-05-26 DIAGNOSIS — Z7189 Other specified counseling: Secondary | ICD-10-CM | POA: Diagnosis not present

## 2022-05-26 DIAGNOSIS — R531 Weakness: Secondary | ICD-10-CM | POA: Diagnosis present

## 2022-05-26 DIAGNOSIS — G9341 Metabolic encephalopathy: Secondary | ICD-10-CM | POA: Diagnosis present

## 2022-05-26 DIAGNOSIS — R7309 Other abnormal glucose: Secondary | ICD-10-CM | POA: Diagnosis present

## 2022-05-26 DIAGNOSIS — D62 Acute posthemorrhagic anemia: Secondary | ICD-10-CM | POA: Diagnosis present

## 2022-05-26 DIAGNOSIS — D72829 Elevated white blood cell count, unspecified: Secondary | ICD-10-CM | POA: Diagnosis not present

## 2022-05-26 DIAGNOSIS — R6520 Severe sepsis without septic shock: Secondary | ICD-10-CM | POA: Diagnosis present

## 2022-05-26 DIAGNOSIS — R54 Age-related physical debility: Secondary | ICD-10-CM | POA: Diagnosis present

## 2022-05-26 DIAGNOSIS — Z9079 Acquired absence of other genital organ(s): Secondary | ICD-10-CM

## 2022-05-26 DIAGNOSIS — R17 Unspecified jaundice: Secondary | ICD-10-CM | POA: Diagnosis not present

## 2022-05-26 DIAGNOSIS — Z8616 Personal history of COVID-19: Secondary | ICD-10-CM | POA: Diagnosis not present

## 2022-05-26 DIAGNOSIS — D649 Anemia, unspecified: Secondary | ICD-10-CM | POA: Diagnosis not present

## 2022-05-26 DIAGNOSIS — K560 Paralytic ileus: Secondary | ICD-10-CM | POA: Diagnosis present

## 2022-05-26 DIAGNOSIS — R948 Abnormal results of function studies of other organs and systems: Secondary | ICD-10-CM | POA: Diagnosis present

## 2022-05-26 DIAGNOSIS — E876 Hypokalemia: Secondary | ICD-10-CM | POA: Diagnosis not present

## 2022-05-26 DIAGNOSIS — F17210 Nicotine dependence, cigarettes, uncomplicated: Secondary | ICD-10-CM | POA: Diagnosis present

## 2022-05-26 DIAGNOSIS — K2950 Unspecified chronic gastritis without bleeding: Secondary | ICD-10-CM | POA: Diagnosis present

## 2022-05-26 DIAGNOSIS — Z6826 Body mass index (BMI) 26.0-26.9, adult: Secondary | ICD-10-CM

## 2022-05-26 DIAGNOSIS — R7881 Bacteremia: Secondary | ICD-10-CM | POA: Diagnosis not present

## 2022-05-26 HISTORY — PX: IR US GUIDE BX ASP/DRAIN: IMG2392

## 2022-05-26 LAB — BLOOD CULTURE ID PANEL (REFLEXED) - BCID2
A.calcoaceticus-baumannii: NOT DETECTED
A.calcoaceticus-baumannii: NOT DETECTED
Bacteroides fragilis: DETECTED — AB
Bacteroides fragilis: DETECTED — AB
Candida albicans: NOT DETECTED
Candida albicans: NOT DETECTED
Candida auris: NOT DETECTED
Candida auris: NOT DETECTED
Candida glabrata: NOT DETECTED
Candida glabrata: NOT DETECTED
Candida krusei: NOT DETECTED
Candida krusei: NOT DETECTED
Candida parapsilosis: NOT DETECTED
Candida parapsilosis: NOT DETECTED
Candida tropicalis: NOT DETECTED
Candida tropicalis: NOT DETECTED
Cryptococcus neoformans/gattii: NOT DETECTED
Cryptococcus neoformans/gattii: NOT DETECTED
Enterobacter cloacae complex: NOT DETECTED
Enterobacter cloacae complex: NOT DETECTED
Enterobacterales: NOT DETECTED
Enterobacterales: NOT DETECTED
Enterococcus Faecium: NOT DETECTED
Enterococcus Faecium: NOT DETECTED
Enterococcus faecalis: NOT DETECTED
Enterococcus faecalis: NOT DETECTED
Escherichia coli: NOT DETECTED
Escherichia coli: NOT DETECTED
Haemophilus influenzae: NOT DETECTED
Haemophilus influenzae: NOT DETECTED
Klebsiella aerogenes: NOT DETECTED
Klebsiella aerogenes: NOT DETECTED
Klebsiella oxytoca: NOT DETECTED
Klebsiella oxytoca: NOT DETECTED
Klebsiella pneumoniae: NOT DETECTED
Klebsiella pneumoniae: NOT DETECTED
Listeria monocytogenes: NOT DETECTED
Listeria monocytogenes: NOT DETECTED
Neisseria meningitidis: NOT DETECTED
Neisseria meningitidis: NOT DETECTED
Proteus species: NOT DETECTED
Proteus species: NOT DETECTED
Pseudomonas aeruginosa: NOT DETECTED
Pseudomonas aeruginosa: NOT DETECTED
Salmonella species: NOT DETECTED
Salmonella species: NOT DETECTED
Serratia marcescens: NOT DETECTED
Serratia marcescens: NOT DETECTED
Staphylococcus aureus (BCID): NOT DETECTED
Staphylococcus aureus (BCID): NOT DETECTED
Staphylococcus epidermidis: NOT DETECTED
Staphylococcus epidermidis: NOT DETECTED
Staphylococcus lugdunensis: NOT DETECTED
Staphylococcus lugdunensis: NOT DETECTED
Staphylococcus species: NOT DETECTED
Staphylococcus species: NOT DETECTED
Stenotrophomonas maltophilia: NOT DETECTED
Stenotrophomonas maltophilia: NOT DETECTED
Streptococcus agalactiae: NOT DETECTED
Streptococcus agalactiae: NOT DETECTED
Streptococcus pneumoniae: NOT DETECTED
Streptococcus pneumoniae: NOT DETECTED
Streptococcus pyogenes: NOT DETECTED
Streptococcus pyogenes: NOT DETECTED
Streptococcus species: DETECTED — AB
Streptococcus species: DETECTED — AB

## 2022-05-26 LAB — MRSA NEXT GEN BY PCR, NASAL: MRSA by PCR Next Gen: NOT DETECTED

## 2022-05-26 LAB — PREPARE RBC (CROSSMATCH)

## 2022-05-26 LAB — URINALYSIS, ROUTINE W REFLEX MICROSCOPIC
Glucose, UA: NEGATIVE mg/dL
Ketones, ur: NEGATIVE mg/dL
Leukocytes,Ua: NEGATIVE
Nitrite: NEGATIVE
Protein, ur: NEGATIVE mg/dL
RBC / HPF: >50 RBC/hpf (ref 0–5)
Specific Gravity, Urine: 1.013 (ref 1.005–1.030)
pH: 6 (ref 5.0–8.0)

## 2022-05-26 LAB — CBC WITH DIFFERENTIAL/PLATELET
Abs Immature Granulocytes: 0.65 10*3/uL — ABNORMAL HIGH (ref 0.00–0.07)
Abs Immature Granulocytes: 0.56 10*3/uL — ABNORMAL HIGH (ref 0.00–0.07)
Basophils Absolute: 0.1 10*3/uL (ref 0.0–0.1)
Basophils Absolute: 0.1 10*3/uL (ref 0.0–0.1)
Basophils Relative: 0 %
Basophils Relative: 0 %
Eosinophils Absolute: 0 10*3/uL (ref 0.0–0.5)
Eosinophils Absolute: 0.1 10*3/uL (ref 0.0–0.5)
Eosinophils Relative: 0 %
Eosinophils Relative: 1 %
HCT: 19.3 % — ABNORMAL LOW (ref 39.0–52.0)
HCT: 19.3 % — ABNORMAL LOW (ref 39.0–52.0)
Hemoglobin: 6.5 g/dL — CL (ref 13.0–17.0)
Hemoglobin: 6.6 g/dL — CL (ref 13.0–17.0)
Immature Granulocytes: 3 %
Immature Granulocytes: 3 %
Lymphocytes Relative: 2 %
Lymphocytes Relative: 1 %
Lymphs Abs: 0.5 10*3/uL — ABNORMAL LOW (ref 0.7–4.0)
Lymphs Abs: 0.2 10*3/uL — ABNORMAL LOW (ref 0.7–4.0)
MCH: 29.5 pg (ref 26.0–34.0)
MCH: 29.5 pg (ref 26.0–34.0)
MCHC: 33.7 g/dL (ref 30.0–36.0)
MCHC: 34.2 g/dL (ref 30.0–36.0)
MCV: 87.7 fL (ref 80.0–100.0)
MCV: 86.2 fL (ref 80.0–100.0)
Monocytes Absolute: 2.1 10*3/uL — ABNORMAL HIGH (ref 0.1–1.0)
Monocytes Absolute: 2.2 10*3/uL — ABNORMAL HIGH (ref 0.1–1.0)
Monocytes Relative: 11 %
Monocytes Relative: 10 %
Neutro Abs: 16.3 10*3/uL — ABNORMAL HIGH (ref 1.7–7.7)
Neutro Abs: 18.2 10*3/uL — ABNORMAL HIGH (ref 1.7–7.7)
Neutrophils Relative %: 84 %
Neutrophils Relative %: 85 %
Platelets: 106 10*3/uL — ABNORMAL LOW (ref 150–400)
Platelets: 72 10*3/uL — ABNORMAL LOW (ref 150–400)
RBC: 2.2 MIL/uL — ABNORMAL LOW (ref 4.22–5.81)
RBC: 2.24 MIL/uL — ABNORMAL LOW (ref 4.22–5.81)
RDW: 20.8 % — ABNORMAL HIGH (ref 11.5–15.5)
RDW: 19.9 % — ABNORMAL HIGH (ref 11.5–15.5)
WBC: 19.5 10*3/uL — ABNORMAL HIGH (ref 4.0–10.5)
WBC: 21.3 10*3/uL — ABNORMAL HIGH (ref 4.0–10.5)
nRBC: 0.2 % (ref 0.0–0.2)
nRBC: 0.1 % (ref 0.0–0.2)

## 2022-05-26 LAB — COMPREHENSIVE METABOLIC PANEL
ALT: 344 U/L — ABNORMAL HIGH (ref 0–44)
ALT: 325 U/L — ABNORMAL HIGH (ref 0–44)
AST: 799 U/L — ABNORMAL HIGH (ref 15–41)
AST: 650 U/L — ABNORMAL HIGH (ref 15–41)
Albumin: <1.5 g/dL — ABNORMAL LOW (ref 3.5–5.0)
Albumin: 1.5 g/dL — ABNORMAL LOW (ref 3.5–5.0)
Alkaline Phosphatase: 388 U/L — ABNORMAL HIGH (ref 38–126)
Alkaline Phosphatase: 448 U/L — ABNORMAL HIGH (ref 38–126)
Anion gap: 13 (ref 5–15)
Anion gap: 13 (ref 5–15)
BUN: 80 mg/dL — ABNORMAL HIGH (ref 8–23)
BUN: 77 mg/dL — ABNORMAL HIGH (ref 8–23)
CO2: 18 mmol/L — ABNORMAL LOW (ref 22–32)
CO2: 18 mmol/L — ABNORMAL LOW (ref 22–32)
Calcium: 7.2 mg/dL — ABNORMAL LOW (ref 8.9–10.3)
Calcium: 7.4 mg/dL — ABNORMAL LOW (ref 8.9–10.3)
Chloride: 103 mmol/L (ref 98–111)
Chloride: 106 mmol/L (ref 98–111)
Creatinine, Ser: 1.97 mg/dL — ABNORMAL HIGH (ref 0.61–1.24)
Creatinine, Ser: 1.5 mg/dL — ABNORMAL HIGH (ref 0.61–1.24)
GFR, Estimated: 35 mL/min — ABNORMAL LOW (ref >60)
GFR, Estimated: 49 mL/min — ABNORMAL LOW (ref >60)
Glucose, Bld: 108 mg/dL — ABNORMAL HIGH (ref 70–99)
Glucose, Bld: 102 mg/dL — ABNORMAL HIGH (ref 70–99)
Potassium: 2.5 mmol/L — CL (ref 3.5–5.1)
Potassium: 2.5 mmol/L — CL (ref 3.5–5.1)
Sodium: 134 mmol/L — ABNORMAL LOW (ref 135–145)
Sodium: 137 mmol/L (ref 135–145)
Total Bilirubin: 20.8 mg/dL (ref 0.3–1.2)
Total Bilirubin: 23 mg/dL (ref 0.3–1.2)
Total Protein: 5.2 g/dL — ABNORMAL LOW (ref 6.5–8.1)
Total Protein: 5.2 g/dL — ABNORMAL LOW (ref 6.5–8.1)

## 2022-05-26 LAB — LACTIC ACID, PLASMA
Lactic Acid, Venous: 2 mmol/L (ref 0.5–1.9)
Lactic Acid, Venous: 1.6 mmol/L (ref 0.5–1.9)

## 2022-05-26 LAB — FERRITIN: Ferritin: 1174 ng/mL — ABNORMAL HIGH (ref 24–336)

## 2022-05-26 LAB — FOLATE: Folate: 16.5 ng/mL (ref >5.9)

## 2022-05-26 LAB — VITAMIN B12: Vitamin B-12: 1498 pg/mL — ABNORMAL HIGH (ref 180–914)

## 2022-05-26 LAB — AMMONIA: Ammonia: 30 umol/L (ref 9–35)

## 2022-05-26 LAB — IRON AND TIBC
Iron: 33 ug/dL — ABNORMAL LOW (ref 45–182)
Saturation Ratios: 18 % (ref 17.9–39.5)
TIBC: 186 ug/dL — ABNORMAL LOW (ref 250–450)
UIBC: 153 ug/dL

## 2022-05-26 LAB — LIPASE, BLOOD: Lipase: 60 U/L — ABNORMAL HIGH (ref 11–51)

## 2022-05-26 LAB — RETICULOCYTES
Immature Retic Fract: 3.1 % (ref 2.3–15.9)
RBC.: 1.8 MIL/uL — ABNORMAL LOW (ref 4.22–5.81)
Retic Count, Absolute: 19.6 10*3/uL (ref 19.0–186.0)
Retic Ct Pct: 1.1 % (ref 0.4–3.1)

## 2022-05-26 LAB — PROTIME-INR
INR: 1.5 — ABNORMAL HIGH (ref 0.8–1.2)
Prothrombin Time: 18.1 seconds — ABNORMAL HIGH (ref 11.4–15.2)

## 2022-05-26 LAB — BILIRUBIN, DIRECT: Bilirubin, Direct: 15 mg/dL — ABNORMAL HIGH (ref 0.0–0.2)

## 2022-05-26 LAB — HEMOGLOBIN AND HEMATOCRIT, BLOOD
HCT: 20.9 % — ABNORMAL LOW (ref 39.0–52.0)
Hemoglobin: 7.2 g/dL — ABNORMAL LOW (ref 13.0–17.0)

## 2022-05-26 LAB — CBG MONITORING, ED: Glucose-Capillary: 92 mg/dL (ref 70–99)

## 2022-05-26 LAB — ETHANOL: Alcohol, Ethyl (B): <10 mg/dL (ref <10)

## 2022-05-26 MED ORDER — LORAZEPAM 1 MG PO TABS
1.0000 mg | ORAL_TABLET | ORAL | Status: DC | PRN
  Administered 2022-05-26: 1 mg via ORAL
  Administered 2022-05-27: 2 mg via ORAL
  Filled 2022-05-26 (×2): qty 2
  Filled 2022-05-26: qty 1

## 2022-05-26 MED ORDER — POTASSIUM CHLORIDE CRYS ER 20 MEQ PO TBCR
40.0000 meq | EXTENDED_RELEASE_TABLET | ORAL | Status: AC
  Administered 2022-05-26 – 2022-05-27 (×2): 40 meq via ORAL
  Filled 2022-05-26 (×2): qty 2

## 2022-05-26 MED ORDER — FINASTERIDE 5 MG PO TABS
5.0000 mg | ORAL_TABLET | Freq: Every day | ORAL | Status: DC
  Administered 2022-05-27: 5 mg via ORAL
  Filled 2022-05-26: qty 1

## 2022-05-26 MED ORDER — MIDAZOLAM HCL 2 MG/2ML IJ SOLN
INTRAMUSCULAR | Status: AC
  Filled 2022-05-26: qty 2

## 2022-05-26 MED ORDER — LIDOCAINE HCL 1 % IJ SOLN
INTRAMUSCULAR | Status: AC
  Administered 2022-05-26: 5 mL
  Filled 2022-05-26: qty 20

## 2022-05-26 MED ORDER — FENTANYL CITRATE PF 50 MCG/ML IJ SOSY
25.0000 ug | PREFILLED_SYRINGE | INTRAMUSCULAR | Status: DC | PRN
  Administered 2022-05-26 – 2022-05-28 (×7): 25 ug via INTRAVENOUS
  Filled 2022-05-26 (×6): qty 1

## 2022-05-26 MED ORDER — SODIUM CHLORIDE 0.9% FLUSH
5.0000 mL | Freq: Three times a day (TID) | INTRAVENOUS | Status: DC
  Administered 2022-05-26 – 2022-05-31 (×13): 5 mL

## 2022-05-26 MED ORDER — ONDANSETRON HCL 4 MG/2ML IJ SOLN: 4.0000 mg | Freq: Four times a day (QID) | INTRAMUSCULAR | Status: DC | PRN

## 2022-05-26 MED ORDER — POTASSIUM CHLORIDE CRYS ER 20 MEQ PO TBCR: 40.0000 meq | EXTENDED_RELEASE_TABLET | ORAL | Status: DC

## 2022-05-26 MED ORDER — THIAMINE MONONITRATE 100 MG PO TABS: 100.0000 mg | ORAL_TABLET | Freq: Every day | ORAL | Status: DC

## 2022-05-26 MED ORDER — FOLIC ACID 1 MG PO TABS: 1.0000 mg | ORAL_TABLET | Freq: Every day | ORAL | Status: DC

## 2022-05-26 MED ORDER — PANTOPRAZOLE SODIUM 40 MG IV SOLR
40.0000 mg | Freq: Two times a day (BID) | INTRAVENOUS | Status: DC
  Administered 2022-05-26 – 2022-05-27 (×3): 40 mg via INTRAVENOUS
  Filled 2022-05-26 (×3): qty 10

## 2022-05-26 MED ORDER — ADULT MULTIVITAMIN W/MINERALS CH
1.0000 | ORAL_TABLET | Freq: Every day | ORAL | Status: DC
  Administered 2022-05-27: 1 via ORAL
  Filled 2022-05-26: qty 1

## 2022-05-26 MED ORDER — LORAZEPAM 0.5 MG PO TABS: 0.5000 mg | ORAL_TABLET | ORAL | Status: DC | PRN

## 2022-05-26 MED ORDER — HYDROXYZINE HCL 25 MG PO TABS
25.0000 mg | ORAL_TABLET | Freq: Four times a day (QID) | ORAL | Status: DC | PRN
  Administered 2022-05-27: 25 mg via ORAL
  Filled 2022-05-26: qty 1

## 2022-05-26 MED ORDER — MIDAZOLAM HCL 2 MG/2ML IJ SOLN
INTRAMUSCULAR | Status: AC | PRN
  Administered 2022-05-26: .5 mg via INTRAVENOUS

## 2022-05-26 MED ORDER — SODIUM CHLORIDE 0.9% FLUSH
3.0000 mL | Freq: Two times a day (BID) | INTRAVENOUS | Status: DC
  Administered 2022-05-26 – 2022-06-02 (×14): 3 mL via INTRAVENOUS

## 2022-05-26 MED ORDER — FOLIC ACID 1 MG PO TABS
1.0000 mg | ORAL_TABLET | Freq: Every day | ORAL | Status: DC
  Administered 2022-05-27: 1 mg via ORAL
  Filled 2022-05-26: qty 1

## 2022-05-26 MED ORDER — FAMOTIDINE 20 MG PO TABS
40.0000 mg | ORAL_TABLET | Freq: Every day | ORAL | Status: DC
  Administered 2022-05-27: 40 mg via ORAL
  Filled 2022-05-26: qty 2

## 2022-05-26 MED ORDER — PIPERACILLIN-TAZOBACTAM 3.375 G IVPB 30 MIN
3.3750 g | Freq: Once | INTRAVENOUS | Status: AC
  Administered 2022-05-26: 3.375 g via INTRAVENOUS
  Filled 2022-05-26: qty 50

## 2022-05-26 MED ORDER — PIPERACILLIN-TAZOBACTAM 3.375 G IVPB
3.3750 g | Freq: Three times a day (TID) | INTRAVENOUS | Status: DC
  Administered 2022-05-26 – 2022-05-29 (×9): 3.375 g via INTRAVENOUS
  Filled 2022-05-26 (×9): qty 50

## 2022-05-26 MED ORDER — SODIUM CHLORIDE 0.9% IV SOLUTION: Freq: Once | INTRAVENOUS | Status: AC

## 2022-05-26 MED ORDER — CHLORHEXIDINE GLUCONATE CLOTH 2 % EX PADS
6.0000 | MEDICATED_PAD | Freq: Every day | CUTANEOUS | Status: DC
  Administered 2022-05-26 – 2022-05-28 (×3): 6 via TOPICAL

## 2022-05-26 MED ORDER — POTASSIUM CHLORIDE 2 MEQ/ML IV SOLN
INTRAVENOUS | Status: DC
  Filled 2022-05-26 (×3): qty 1000

## 2022-05-26 MED ORDER — FENTANYL CITRATE (PF) 100 MCG/2ML IJ SOLN
INTRAMUSCULAR | Status: AC
  Filled 2022-05-26: qty 2

## 2022-05-26 MED ORDER — SODIUM BICARBONATE 650 MG PO TABS
650.0000 mg | ORAL_TABLET | Freq: Two times a day (BID) | ORAL | Status: DC
  Administered 2022-05-26 – 2022-05-27 (×3): 650 mg via ORAL
  Filled 2022-05-26 (×3): qty 1

## 2022-05-26 MED ORDER — POTASSIUM CHLORIDE 10 MEQ/100ML IV SOLN
10.0000 meq | INTRAVENOUS | Status: AC
  Administered 2022-05-26 (×4): 10 meq via INTRAVENOUS
  Filled 2022-05-26 (×4): qty 100

## 2022-05-26 MED ORDER — LACTATED RINGERS IV BOLUS
1000.0000 mL | Freq: Once | INTRAVENOUS | Status: AC
  Administered 2022-05-26: 1000 mL via INTRAVENOUS

## 2022-05-26 MED ORDER — FENTANYL CITRATE (PF) 100 MCG/2ML IJ SOLN
INTRAMUSCULAR | Status: AC | PRN
  Administered 2022-05-26: 50 ug via INTRAVENOUS

## 2022-05-26 MED ORDER — ORAL CARE MOUTH RINSE: 15.0000 mL | OROMUCOSAL | Status: DC | PRN

## 2022-05-26 MED ORDER — MORPHINE SULFATE (PF) 2 MG/ML IV SOLN
1.0000 mg | INTRAVENOUS | Status: DC | PRN
  Administered 2022-05-26 – 2022-05-27 (×2): 1 mg via INTRAVENOUS
  Filled 2022-05-26 (×2): qty 1

## 2022-05-26 MED ORDER — ONDANSETRON HCL 4 MG PO TABS: 4.0000 mg | ORAL_TABLET | Freq: Four times a day (QID) | ORAL | Status: DC | PRN

## 2022-05-26 MED ORDER — FENTANYL CITRATE PF 50 MCG/ML IJ SOSY: 12.5000 ug | PREFILLED_SYRINGE | INTRAMUSCULAR | Status: DC | PRN

## 2022-05-26 MED ORDER — POTASSIUM CHLORIDE 10 MEQ/100ML IV SOLN
10.0000 meq | INTRAVENOUS | Status: AC
  Administered 2022-05-26 (×2): 10 meq via INTRAVENOUS
  Filled 2022-05-26 (×2): qty 100

## 2022-05-26 MED ORDER — THIAMINE HCL 100 MG/ML IJ SOLN
100.0000 mg | Freq: Every day | INTRAMUSCULAR | Status: DC
  Administered 2022-05-26 – 2022-05-29 (×4): 100 mg via INTRAVENOUS
  Filled 2022-05-26 (×4): qty 2

## 2022-05-26 NOTE — ED Notes (Signed)
Blood transfusion started. Verified at bedside by 2 RN's

## 2022-05-26 NOTE — Consult Note (Addendum)
Chief Complaint: Patient was seen in consultation today for hepatic abscess  Referring Physician(s): * No referring provider recorded for this case *  Supervising Physician: Daryll Brod  Patient Status: [facility] - In-pt  History of Present Illness: Eddie Mendoza is a 72 y.o. male with PMH significant for hypertension, bladder tumor s/p resection in 2022, hematuria, pancreatic and biliary masses, and anemia being seen today in relation to newly discovered hepatic abscess. The patient presented to [facility] ED on 3/7 for weakness and fever. CT Abdomen was performed today, revealing a large 7.8 x 6.9 x 8.7 cm fluid collection concerning for abscess. IR was subsequently consulted for possible drain placement.  Past Medical History:  Diagnosis Date   Abnormal CT scan, gallbladder 08/13/2020   Hypertension     Past Surgical History:  Procedure Laterality Date   BILIARY BRUSHING  05/13/2022   Procedure: BILIARY BRUSHING;  Surgeon: Rush Landmark Telford Nab., MD;  Location: [place];  Service: Gastroenterology;;   BILIARY STENT PLACEMENT  05/13/2022   Procedure: BILIARY STENT PLACEMENT;  Surgeon: Irving Copas., MD;  Location: [place];  Service: Gastroenterology;;   BIOPSY  05/13/2022   Procedure: BIOPSY;  Surgeon: Irving Copas., MD;  Location: [place];  Service: Gastroenterology;;   ERCP N/A 05/13/2022   Procedure: ENDOSCOPIC RETROGRADE CHOLANGIOPANCREATOGRAPHY (ERCP);  Surgeon: Irving Copas., MD;  Location: [place];  Service: Gastroenterology;  Laterality: N/A;   ESOPHAGOGASTRODUODENOSCOPY N/A 05/13/2022   Procedure: ESOPHAGOGASTRODUODENOSCOPY (EGD);  Surgeon: Irving Copas., MD;  Location: [place];  Service: Gastroenterology;  Laterality: N/A;   EUS N/A 05/13/2022   Procedure: FULL UPPER ENDOSCOPIC ULTRASOUND (EUS) RADIAL;  Surgeon: Rush Landmark Telford Nab., MD;  Location: [place];  Service: Gastroenterology;  Laterality: N/A;    FINE NEEDLE ASPIRATION  05/13/2022   Procedure: FINE NEEDLE ASPIRATION (FNA) LINEAR;  Surgeon: Irving Copas., MD;  Location: [place];  Service: Gastroenterology;;   PANCREATIC STENT PLACEMENT  05/13/2022   Procedure: PANCREATIC STENT PLACEMENT;  Surgeon: Irving Copas., MD;  Location: [place];  Service: Gastroenterology;;   REMOVAL OF STONES  05/13/2022   Procedure: REMOVAL OF STONES;  Surgeon: Irving Copas., MD;  Location: [place];  Service: Gastroenterology;;   [name]  05/13/2022   Procedure: [name];  Surgeon: Irving Copas., MD;  Location: [place];  Service: Gastroenterology;;   TRANSURETHRAL RESECTION OF BLADDER TUMOR N/A 06/30/2020   Procedure:  Almyra Free OF BLEEDING, CLOT EVACUATION;  Surgeon: Ardis Hughs, MD;  Location: [facility];  Service: Urology;  Laterality: N/A;   TRANSURETHRAL RESECTION OF BLADDER TUMOR N/A 08/13/2020   Procedure: CYSTOSCOPY, CLOT EVACUATION, FULGERATION;  Surgeon: Ardis Hughs, MD;  Location: [facility];  Service: Urology;  Laterality: N/A;    Allergies: Patient has no known allergies.  Medications: Prior to Admission medications   Medication Sig Start Date End Date Taking? Authorizing Provider  cetirizine (ZYRTEC) 10 MG tablet Take 10 mg by mouth at bedtime. 05/06/22   [provider]  Emollient (EUCERIN) lotion Apply 1 Application topically as needed for dry skin (itching).    [provider]  famotidine (PEPCID) 40 MG tablet Take 1 tablet (40 mg total) by mouth daily. 05/17/22   Richarda Osmond, MD  ferrous sulfate 325 (65 FE) MG EC tablet Take 325 mg by mouth every Monday, Wednesday, and Friday. 02/22/22   [provider]  finasteride (PROSCAR) 5 MG tablet Take 1 tablet (5 mg total) by mouth daily. 07/02/20   Vance Gather  B, MD  folic acid (FOLVITE) 1 MG tablet Take 1 tablet (1 mg total) by mouth daily. 05/17/22 06/16/22  Richarda Osmond, MD   furosemide (LASIX) 40 MG tablet Take 1 tablet (40 mg total) by mouth daily. 05/17/22 06/16/22  Richarda Osmond, MD  hydrOXYzine (VISTARIL) 25 MG capsule Take 25 mg by mouth every 6 (six) hours as needed for itching. 05/06/22   [provider]  lidocaine (XYLOCAINE) 5 % ointment Apply 1 application topically 4 (four) times daily as needed for mild pain or moderate pain. 04/15/20   [provider]  sodium bicarbonate 650 MG tablet Take 1 tablet (650 mg total) by mouth 2 (two) times daily for 14 days. 05/17/22 05/31/22  Richarda Osmond, MD  tamsulosin (FLOMAX) 0.4 MG CAPS capsule Take 1 capsule (0.4 mg total) by mouth daily. 07/01/20   Patrecia Pour, MD  thiamine (VITAMIN B-1) 100 MG tablet Take 1 tablet (100 mg total) by mouth daily. 05/17/22 06/16/22  Richarda Osmond, MD  triamcinolone ointment (KENALOG) 0.1 % Apply 1 Application topically 2 (two) times daily. 05/06/22   [provider]     History reviewed. No pertinent family history.  Social History   Socioeconomic History   Marital status: Married    Spouse name: Not on file   Number of children: Not on file   Years of education: Not on file   Highest education level: Not on file  Occupational History   Not on file  Tobacco Use   Smoking status: Every Day    Packs/day: 0.50    Years: 30.00    Total pack years: 15.00    Types: Cigarettes   Smokeless tobacco: Never  Vaping Use   Vaping Use: Never used  Substance and Sexual Activity   Alcohol use: Yes    Alcohol/week: 3.0 standard drinks of alcohol    Types: 3 Shots of liquor per week   Drug use: No   Sexual activity: Not on file  Other Topics Concern   Not on file  Social History Narrative   Not on file   Social Determinants of Health   Financial Resource Strain: Not on file  Food Insecurity: Not on file  Transportation Needs: Not on file  Physical Activity: Not on file  Stress: Not on file  Social Connections: Not on file    Code  Status: Full code  Review of Systems: A 12 point ROS discussed and pertinent positives are indicated in the HPI above.  All other systems are negative.  Review of Systems  Constitutional:  Positive for fever. Negative for chills.  Respiratory:  Negative for chest tightness and shortness of breath.   Cardiovascular:  Positive for leg swelling. Negative for chest pain.  Gastrointestinal:  Positive for abdominal distention and abdominal pain. Negative for diarrhea, nausea and vomiting.  Neurological:  Negative for dizziness and headaches.  Psychiatric/Behavioral:  Negative for confusion.     Vital Signs: BP (!) 121/58 (BP Location: Right Arm)   Pulse 84   Temp 98.6 F (37 C) (Oral)   Resp 18   Ht 6' (1.829 m)   Wt 196 lb 10.4 oz (89.2 kg)   SpO2 98%   BMI 26.67 kg/m    Physical Exam Vitals reviewed.  Constitutional:      Appearance: He is ill-appearing.  HENT:     Mouth/Throat:     Mouth: Mucous membranes are moist.  Cardiovascular:     Rate and Rhythm: Normal rate  and regular rhythm.     Pulses: Normal pulses.     Heart sounds: Normal heart sounds.  Pulmonary:     Effort: Pulmonary effort is normal.     Breath sounds: Normal breath sounds.  Abdominal:     General: There is distension.     Tenderness: There is no abdominal tenderness.  Musculoskeletal:     Right lower leg: Edema present.     Left lower leg: Edema present.  Skin:    General: Skin is warm and dry.  Neurological:     Mental Status: He is alert and oriented to person, place, and time.  Psychiatric:        Mood and Affect: Mood normal.        Behavior: Behavior normal.        Thought Content: Thought content normal.        Judgment: Judgment normal.     Imaging: CT ABDOMEN PELVIS WO CONTRAST  Result Date: 05/26/2022 CLINICAL DATA:  Abdominal pain, weakness and jaundice. EXAM: CT ABDOMEN AND PELVIS WITHOUT CONTRAST TECHNIQUE: Multidetector CT imaging of the abdomen and pelvis was performed following  the standard protocol without IV contrast. RADIATION DOSE REDUCTION: This exam was performed according to the departmental dose-optimization program which includes automated exposure control, adjustment of the mA and/or kV according to patient size and/or use of iterative reconstruction technique. COMPARISON:  CT with IV contrast 05/16/2022, 05/11/2022, and 03/21/2022. FINDINGS: Lower chest: Development of bilateral trace pleural effusions. Mild increased posterior atelectasis. No acute process. Bilateral gynecomastia. The cardiac size is normal. The cardiac blood pool is less dense than the myocardium consistent with anemia. Hepatobiliary: Again noted are common bile duct stent and a small caliber stent in the proximal pancreatic duct both terminating in the duodenal. The gallbladder is fluid-filled but not dilated, unchanged with again noted thickened appearance to the gallbladder neck which is ill-defined and may infiltrate the adjacent liver. Severe intrahepatic biliary dilatation continues to be seen. There is interval development of a large heterogeneous fluid collection in the right lobe of the liver in segment 7 measuring 7.8 x 6.9 by 8.7 cm, concerning for hepatic abscess since it is unlikely a metastasis this large would have developed in 10 days. There is a small new fluid collection in segment 4B on 3:25 measuring 2.2 cm which could also be an abscess. There is also a new nonspecific hypodensity in segment 5 on 3:34 measuring 1.7 cm, and another in the more anterior aspect of segment 5 measuring 2.2 cm on 3:33. I suspect these are probably also fluid collections although it is possible they could be metastases. No other new abnormality in the liver is seen. Pancreas: Changes of proximal to mid peripancreatic edema are somewhat increased today but there is generalized increased mesenteric edema elsewhere as well. 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 A 3 cm hypodense lesion in the pancreatic  head appears similar. No downstream ductal dilatation is visible. Small caliber stent in the proximal pancreatic duct is similar in positioning. Spleen: Increased splenomegaly, 15 cm coronal, previously 14.1 cm coronal. No focal abnormality without contrast. Adrenals/Urinary Tract: No adrenal or renal masses seen without contrast. No urinary stone or hydronephrosis. Large heterogeneous mass in the bladder continues to be seen. Stomach/Bowel: The stomach and small-bowel show no obvious abnormality. The appendix is normal caliber. There is wall thickening in the ascending, transverse and descending colon concerning for colitis, scattered inflammatory changes over portions suspected. Vascular/Lymphatic: Stable retroperitoneal and mesenteric root adenopathy. No  new adenopathy. Aortic atherosclerosis. Reproductive: Severe prostatomegaly. Other: Mild but interval increased ascites in the abdomen and pelvis. Increased generalized mesenteric edema and increased body wall anasarca. No free air. Musculoskeletal: No new or acute osseous findings. No destructive lesions. IMPRESSION: 1. Interval development of a large heterogeneous fluid collection in the right lobe of the liver in segment 7 measuring 7.8 x 6.9 x 8.7 cm, concerning for hepatic abscess since it is unlikely a metastasis this large would have developed in 10 days. 2. There is a new 2.2 cm fluid collection in segment 4B on 3:25 which could also be an abscess. 3. There are 2 additional nonspecific hypodensities in the right lobe of the liver measuring 1.7 cm and 2.2 cm. I suspect these are also fluid collections although it is possible they could be metastases. MRI without and with contrast may be helpful. 4. Persistent severe intrahepatic biliary dilatation with common bile duct stent and small caliber stent in the proximal pancreatic duct. 5. Increased peripancreatic edema and generalized mesenteric edema, increased body wall anasarca and mild interval increased  abdominopelvic ascites. 6. Stable 3 cm hypodense lesion in the pancreatic head. 7. Stable retroperitoneal and mesenteric root adenopathy. 8. New trace pleural effusions. 9. Increased splenomegaly. 10. Large heterogeneous mass in the bladder continues to be seen. 11. Diffuse colonic wall thickening concerning for colitis, scattered inflammatory changes over portions suspected. Otherwise possible this could be congestive or due to hepatic dysfunction. 12. Severe prostatomegaly. 13. Aortic atherosclerosis. Aortic Atherosclerosis (ICD10-I70.0). Electronically Signed   By: Telford Nab M.D.   On: 05/26/2022 07:21   CT Head Wo Contrast  Result Date: 05/26/2022 CLINICAL DATA:  72 year old male with altered mental status. Increased weakness, jaundice, abdominal pain. EXAM: CT HEAD WITHOUT CONTRAST TECHNIQUE: Contiguous axial images were obtained from the base of the skull through the vertex without intravenous contrast. RADIATION DOSE REDUCTION: This exam was performed according to the departmental dose-optimization program which includes automated exposure control, adjustment of the mA and/or kV according to patient size and/or use of iterative reconstruction technique. COMPARISON:  None Available. FINDINGS: Brain: Cerebral volume is within normal limits for age. No midline shift, ventriculomegaly, mass effect, evidence of mass lesion, intracranial hemorrhage or evidence of cortically based acute infarction. Gray-white matter differentiation is within normal limits throughout the brain. No encephalomalacia identified. Vascular: Mild Calcified atherosclerosis at the skull base. No suspicious intracranial vascular hyperdensity. Skull: No acute osseous abnormality identified. Sinuses/Orbits: Pronounced maxillary sinus mucoperiosteal thickening with superimposed fluid level and bubbly opacity. Other paranasal sinuses are fairly well aerated, only mild mucosal thickening elsewhere. Tympanic cavities and mastoids are clear.  Other: Visualized orbits and scalp soft tissues are within normal limits. IMPRESSION: 1. Normal for age non contrast CT appearance of the brain. 2. 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 Electronically Signed   By: Genevie Ann M.D.   On: 05/26/2022 06:43   DG Chest Portable 1 View  Result Date: 05/26/2022 CLINICAL DATA:  Altered mental status, jaundice and weakness. EXAM: PORTABLE CHEST 1 VIEW COMPARISON:  PA chest 07/22/2010 FINDINGS: The lungs are expiratory with limited view of the bases. The visualized lungs are clear. The sulci are sharp. Heart size and vasculature are normal for the low lung volumes. The mediastinum is normally outlined. There is thoracic spondylosis. IMPRESSION: Expiratory exam with limited view of the bases. No evidence of acute cardiopulmonary disease within study limitations. Electronically Signed   By: Telford Nab M.D.   On: 05/26/2022 06:27   VAS [place] LOWER EXTREMITY  VENOUS (DVT)  Result Date: 05/16/2022  Lower Venous DVT Study Patient Name:  SANDIP GOTTSCHALL  Date of Exam:   05/16/2022 Medical Rec #: [id number]         Accession #:    [id number] Date of Birth: Feb 22, 1951          Patient Gender: M Patient Age:   73 years Exam Location:  [facility] Procedure:      VAS [place] LOWER EXTREMITY VENOUS (DVT) Referring Phys: Bonnielee Haff --------------------------------------------------------------------------------  Indications: Edema.  Limitations: Poor ultrasound/tissue interface. Comparison Study: No previous exams Performing Technologist: Jody Hill RVT, RDMS  Examination Guidelines: A complete evaluation includes B-mode imaging, spectral Doppler, color Doppler, and power Doppler as needed of all accessible portions of each vessel. Bilateral testing is considered an integral part of a complete examination. Limited examinations for reoccurring indications may be performed as noted. The reflux portion of the exam is performed with the patient in reverse Trendelenburg.   +---------+---------------+---------+-----------+----------+--------------+ RIGHT    CompressibilityPhasicitySpontaneityPropertiesThrombus Aging +---------+---------------+---------+-----------+----------+--------------+ CFV      Full           No       Yes                                 +---------+---------------+---------+-----------+----------+--------------+ SFJ      Full                                                        +---------+---------------+---------+-----------+----------+--------------+ FV Prox  Full           Yes      Yes                                 +---------+---------------+---------+-----------+----------+--------------+ FV Mid   Full           Yes      Yes                                 +---------+---------------+---------+-----------+----------+--------------+ FV DistalFull           Yes      Yes                                 +---------+---------------+---------+-----------+----------+--------------+ PFV      Full                                                        +---------+---------------+---------+-----------+----------+--------------+ POP      Full           Yes      Yes                                 +---------+---------------+---------+-----------+----------+--------------+ PTV      Full                                                        +---------+---------------+---------+-----------+----------+--------------+  PERO     Full                                                        +---------+---------------+---------+-----------+----------+--------------+   +---------+---------------+---------+-----------+----------+--------------+ LEFT     CompressibilityPhasicitySpontaneityPropertiesThrombus Aging +---------+---------------+---------+-----------+----------+--------------+ CFV      Full           Yes      Yes                                  +---------+---------------+---------+-----------+----------+--------------+ SFJ      Full                                                        +---------+---------------+---------+-----------+----------+--------------+ FV Prox  Full           Yes      Yes                                 +---------+---------------+---------+-----------+----------+--------------+ FV Mid   Full           Yes      Yes                                 +---------+---------------+---------+-----------+----------+--------------+ FV DistalFull           Yes      Yes                                 +---------+---------------+---------+-----------+----------+--------------+ PFV      Full                                                        +---------+---------------+---------+-----------+----------+--------------+ POP      Full           Yes      Yes                                 +---------+---------------+---------+-----------+----------+--------------+ PTV      Full                                                        +---------+---------------+---------+-----------+----------+--------------+ PERO     Full                                                        +---------+---------------+---------+-----------+----------+--------------+  Summary: BILATERAL: - No evidence of deep vein thrombosis seen in the lower extremities, bilaterally. -No evidence of popliteal cyst, bilaterally.   *See table(s) above for measurements and observations. Electronically signed by Servando Snare MD on 05/16/2022 at 5:18:19 PM.    Final    CT ABDOMEN PELVIS W CONTRAST  Result Date: 05/16/2022 CLINICAL DATA:  Biliary stent deployment with elevated white count, difficult ERCP. EXAM: CT ABDOMEN AND PELVIS WITH CONTRAST TECHNIQUE: Multidetector CT imaging of the abdomen and pelvis was performed using the standard protocol following bolus administration of intravenous contrast. RADIATION DOSE  REDUCTION: This exam was performed according to the departmental dose-optimization program which includes automated exposure control, adjustment of the mA and/or kV according to patient size and/or use of iterative reconstruction technique. CONTRAST:  23m OMNIPAQUE IOHEXOL 350 MG/ML SOLN COMPARISON:  CT with IV contrast 05/11/2022, CT with IV contrast 03/21/2022. FINDINGS: Lower chest: No acute abnormality. Hepatobiliary: Respiratory motion limits image quality as well as artifact from the patient's arms in the field. Gallbladder is fluid-filled but again not dilated. Ill-defined thickening in the gallbladder neck is again noted on 3: 30-33. Similar to the last CT there is severe intrahepatic biliary dilatation, with no appreciable improvement despite interval stent placement from the intrahepatic ductal confluence through the CBD into the duodenum. A short wire or small caliber stent extends from the CBD stent into the proximal pancreatic duct. There is scattered air in the dilated bile ducts which could be due to the ERCP and stent placement or infectious process. No liver mass or hepatic abscess is seen. Pancreas: Allowing for respiratory motion there is probably increased proximal peripancreatic edema, concerning for pancreatitis. A short wire or in proximal pancreatic duct is noted. There is no downstream ductal dilatation. 3 x 2.5 cm cystic lesion again is noted of the pancreatic head. Spleen: Mildly prominent measuring 14.1 cm coronal, previously 13.5 cm coronal. No mass or abscess. Adrenals/Urinary Tract: No adrenal or renal mass is seen, no urinary stone or obstruction. There is symmetric delayed phase renal excretion. There is a large heterogeneous mass redemonstrated in the bladder. There is elsewhere mild generalized thickening of the bladder. Stomach/Bowel: No bowel obstruction or inflammatory change is seen. The appendix is normal. Vascular/Lymphatic: Stable retroperitoneal adenopathy, largest index  lymph node in the left periaortic chain again measuring 2 cm in short axis. Aortic atherosclerosis. Reproductive: Severe prostatomegaly. Other: Mild free ascites in the abdomen and pelvis.  No free air. Musculoskeletal: No new findings. IMPRESSION: 1. Similar to the last CT there is severe intrahepatic biliary dilatation, but with interval stent placement from the intrahepatic ductal confluence through the CBD into the duodenum. There is scattered air in the dilated bile ducts which could be due to the ERCP and stent placement or infectious process. Ill-defined thickening in the gallbladder neck appears similar. 2. Allowing for respiratory motion, there is probably increased proximal peripancreatic edema, concerning for pancreatitis. 3. 3 x 2.5 cm cystic lesion of the pancreatic head, unchanged. 4. Large heterogeneous bladder mass redemonstrated. 5. Mild free fluid in the abdomen and pelvis. 6. Severe prostatomegaly. 7. Stable retroperitoneal adenopathy. 8. Aortic atherosclerosis. Aortic Atherosclerosis (ICD10-I70.0). Electronically Signed   By: KTelford NabM.D.   On: 05/16/2022 02:56   DG ERCP  Result Date: 05/14/2022 CLINICAL DATA:  72year old male with history of biliary obstruction. EXAM: ERCP TECHNIQUE: Multiple spot images obtained with the fluoroscopic device and submitted for interpretation post-procedure. FLUOROSCOPY TIME:  81.7 mGy COMPARISON:  CT abdomen pelvis from 05/11/2022  FINDINGS: Retrograde cannulation of the common bile duct into the intrahepatic ducts and cholangiogram demonstrates severe intrahepatic biliary duct dilation with obstructive hilar mass. A plastic biliary stent is placed. IMPRESSION: 1. Severe biliary obstruction at the level of the hilum. 2. Common bile duct stent was placed. These images were submitted for radiologic interpretation only. Please see the procedural report for the full procedural details, amount of contrast, and the fluoroscopy time utilized. Ruthann Cancer, MD  [facility] Electronically Signed   By: Ruthann Cancer M.D.   On: 05/14/2022 08:06   CT ABDOMEN PELVIS W CONTRAST  Result Date: 05/12/2022 CLINICAL DATA:  Biliary obstruction suspected (Ped 0-17y) EXAM: CT ABDOMEN AND PELVIS WITH CONTRAST TECHNIQUE: Multidetector CT imaging of the abdomen and pelvis was performed using the standard protocol following bolus administration of intravenous contrast. RADIATION DOSE REDUCTION: This exam was performed according to the departmental dose-optimization program which includes automated exposure control, adjustment of the mA and/or kV according to patient size and/or use of iterative reconstruction technique. CONTRAST:  60m OMNIPAQUE IOHEXOL 350 MG/ML SOLN COMPARISON:  CT abdomen pelvis 03/21/2022 FINDINGS: Lower chest: No acute abnormality. Hepatobiliary: No focal liver abnormality. Poorly visualized gallbladder with persistent gallbladder wall thickening around the neck (3:24). Interval worsening of severe intra and extrahepatic biliary ductal dilatation. Pancreas: Persistent stable 2.9 x 2.4 cm proximal pancreatic duct hypodense lesion. Slightly hazy pancreatic contour proximally with trace fat stranding. No main pancreatic ductal dilatation. Spleen: Normal in size without focal abnormality. Adrenals/Urinary Tract: No adrenal nodule bilaterally. Bilateral kidneys enhance symmetrically. No hydronephrosis. No hydroureter. Redemonstration of a heterogeneous masslike intraluminal lesion within the urinary bladder. Persistent urine bladder wall thickening. On delayed imaging, there is no urothelial wall thickening and there are no filling defects in the opacified portions of the bilateral collecting systems or ureters. Stomach/Bowel: Stomach is within normal limits. No evidence of small bowel wall thickening or dilatation. No large bowel dilatation. Diffuse large bowel wall thickening. No pericolonic fat stranding.  Scattered colonic diverticula. Appendix appears normal. Vascular/Lymphatic: No abdominal aorta or iliac aneurysm. Mild atherosclerotic plaque of the aorta and its branches. Persistent retroperitoneal lymphadenopathy as an example a 1.8 cm left periaortic lymph node (3:34). Difficult to delineate suggestion of portacaval lymphadenopathy (3:18). Reproductive: The prostate is enlarged and heterogeneous measuring up to 7.3 cm. Other: Interval increase in trace simple fluid ascites. No intraperitoneal free gas. No organized fluid collection. Musculoskeletal: No abdominal wall hernia or abnormality. No suspicious lytic or blastic osseous lesions. No acute displaced fracture. Multilevel degenerative changes of the spine. IMPRESSION: 1. Question development of acute pancreatitis. Correlate with lipase levels. 2. Proximal pancreatic 2.9 cm cystic mass with interval worsening of severe intra and extrahepatic biliary ductal dilatation. Persistent poorly visualized gallbladder with gallbladder neck thickening. 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 When the patient is clinically stable and able to follow directions and hold their breath (preferably as an outpatient) further evaluation with dedicated MRI with and without contrast should be considered. 3. Persistent heterogeneous masslike intraluminal lesion within the urinary bladder. Finding could represent a urinary bladder or prostatic mass versus blood products. Recommend urologic consultation. 4. Marked Prostatomegaly and heterogeneity of the prostate gland. 5. Retroperitoneal and likely portacaval lymphadenopathy. 6. Colonic diverticulosis with no acute diverticulitis. 7. Question diffuse colonic bowel thickening with no definite findings of colitis. 8. Trace simple rib fluid ascites. Electronically Signed   By: MIven FinnM.D.   On: 05/12/2022 00:14  Labs:  CBC: Recent Labs    05/15/22 0622  05/16/22 0224 05/17/22 0324 05/26/22 0508  WBC 22.5* 19.0* 14.8* 19.5*  HGB 8.4* 7.5* 7.2* 6.5*  HCT 22.7* 20.9* 19.8* 19.3*  PLT 427* 374 382 106*    COAGS: Recent Labs    05/12/22 0015 05/12/22 0628 05/26/22 0508  INR 1.0 1.0 1.5*    BMP: Recent Labs    05/15/22 0622 05/16/22 0224 05/17/22 0324 05/26/22 0508  NA 136 133* 136 134*  K 3.9 3.4* 3.0* 2.5*  CL 108 106 103 103  CO2 16* 17* 20* 18*  GLUCOSE 92 105* 128* 108*  BUN '15 18 18 '$ 80*  CALCIUM 8.1* 7.9* 8.0* 7.2*  CREATININE 0.80 1.09 1.17 1.97*  GFRNONAA >60 >60 >60 35*    LIVER FUNCTION TESTS: Recent Labs    05/15/22 0622 05/16/22 0224 05/17/22 0324 05/26/22 0508  BILITOT 19.2* 18.4* 16.9* 20.8*  AST 75* 64* 50* 799*  ALT 62* 57* 51* 344*  ALKPHOS 370* 331* 289* 388*  PROT 5.4* 5.3* 5.2* 5.2*  ALBUMIN 1.5* <1.5* <1.5* <1.5*    TUMOR MARKERS: No results for input(s): "AFPTM", "CEA", "CA199", "CHROMGRNA" in the last 8760 hours.  Assessment and Plan:  Eddie Mendoza is a 72 yo male being seen today for hepatic abscess. The patient presented to [facility] ED early in AM, where imaging showed a large fluid collection in the liver. IR was consulted, and following review of imaging and exam of patient, it was determined that an image-guided hepatic abscess drain is warranted. Patient currently on IV Zosyn, WBC of 19.5, Hgb 6.5 earlier this AM and has received unit of PRBCs, platelets are 106k. Case was reviewed and approved by Dr Annamaria Boots for 05/26/22.  Risks and benefits discussed with the patient including bleeding, infection, damage to adjacent structures, bowel perforation/fistula connection, and sepsis.  All of the patient's questions were answered, patient is agreeable to proceed. Consent signed and in chart.   Thank you for this interesting consult.  I greatly enjoyed meeting Vihan R Ferroni and look forward to participating in their care.  A copy of this report was sent to the requesting provider on this  date.  Electronically Signed: Lura Em, PA-C 05/26/2022, 12:16 PM   I spent a total of 40 Minutes    in face to face in clinical consultation, greater than 50% of which was counseling/coordinating care for hepatic abscess.

## 2022-05-26 NOTE — TOC Initial Note (Signed)
Transition of Care [facility]) - Initial/Assessment Note   Patient Details  Name: Eddie Mendoza MRN: [id number] Date of Birth: November 02, 1950  Transition of Care [facility]) CM/SW Contact:    Sherie Don, LCSW Phone Number: 05/26/2022, 3:03 PM  Clinical Narrative: TOC consulted for ETOH use. CSW added ETOH use resources to AVS.  Expected Discharge Plan: Home/Self Care Barriers to Discharge: Continued Medical Work up  Expected Discharge Plan and Services In-house Referral: Clinical Social Work Living arrangements for the past 2 months: Single Family Home           DME Arranged: N/A DME Agency: NA  Prior Living Arrangements/Services Living arrangements for the past 2 months: [place] Patient language and need for interpreter reviewed:: Yes Need for Family Participation in Patient Care: Yes (Comment) Care giver support system in place?: Yes (comment) Criminal Activity/Legal Involvement Pertinent to Current Situation/Hospitalization: No - Comment as needed  Emotional Assessment Alcohol / Substance Use: Alcohol Use  Admission diagnosis:  Metabolic encephalopathy 99991111 AKI (acute kidney injury) ([place]) [N17.9] Elevated bilirubin [R17] Obstructive jaundice [K83.1] Patient Active Problem List   Diagnosis Date Noted   Transaminitis 05/17/2022   Anemia 05/16/2022   Leucocytosis 05/15/2022   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   Elevated liver enzymes 05/12/2022   Hyponatremia 05/12/2022   Normal anion gap metabolic acidosis [id number]   Alcohol use disorder 05/12/2022   Pancreatic mass 05/12/2022   Bladder mass 05/12/2022   Enlarged prostate 05/12/2022   Obstructive jaundice 05/12/2022   Abnormal CT scan, gallbladder 08/13/2020   Gross hematuria 08/12/2020     06/28/2020   Acute on chronic blood loss anemia 06/28/2020   COVID-19 virus infection 06/28/2020   Hematuria 06/20/2020   Essential hypertension 06/20/2020   PCP:  Clinic, [place]:   [id number] [place]Phone: [phone]: [phone] Social Determinants of Health (SDOH) Social History: SDOH Screenings   Tobacco Use: High Risk (05/26/2022)   SDOH Interventions:    Readmission Risk Interventions     No data to display

## 2022-05-26 NOTE — ED Triage Notes (Signed)
Pt sent by PMD for ERCP for increased weakness and jaundice with progressive abdominal pain.

## 2022-05-26 NOTE — ED Notes (Addendum)
Pt was not able to sit in wheelchair and had altered mental staus according to wife. Unable to obtain a pulse ox in triage. PT taken straight back to room 14. Dr Wyvonnia Dusky at bedside. Wife at bedside

## 2022-05-26 NOTE — ED Notes (Signed)
Consent obtained from patient. Verbalized understanding.

## 2022-05-26 NOTE — Progress Notes (Signed)
Pharmacy Antibiotic Note  Christoffer JAKOBEE SNYDER is a 72 y.o. male admitted on 05/26/2022 with IAI.  Pharmacy has been consulted for zosyn dosing.  Plan: Zosyn 3.375 gm IV x 1 dose over 30 minutes followed by Zosyn 3.375g IV q8h (4 hour infusion). Pharmacy to sign off   Temp (24hrs), Avg:98.6 F (37 C), Min:97.5 F (36.4 C), Max:99.6 F (37.6 C)  Recent Labs  Lab 05/26/22 0508 05/26/22 0509 05/26/22 0658  WBC 19.5*  --   --   CREATININE 1.97*  --   --   LATICACIDVEN  --  2.0* 1.6    Estimated Creatinine Clearance: 40.1 mL/min (A) (by C-G formula based on SCr of 1.97 mg/dL (H)).    No Known Allergies  Thank you for allowing pharmacy to be a part of this patient's care.  Eudelia Bunch, Pharm.D Use secure chat for questions 05/26/2022 7:51 AM

## 2022-05-26 NOTE — Consult Note (Addendum)
Referring Provider: EDP, Dr. Wyvonnia Dusky Primary Care Physician:  [facility] Primary Gastroenterologist:  Dr. Rush Landmark  Reason for Consultation:  Jaundice, liver lesions  HPI: Eddie Mendoza is a 72 y.o. male with a past medical history as listed below including Tiernan's urethral resection of bladder tumor in 2022, hematuria, anemia, BPH, substance abuse, hypertension and others, who presented to the ED on 05/11/2022 for jaundice.    He was found on imaging to have severe intra and extrahepatic biliary ductal dilation. 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  Atypical cells found but no overt cancer found as of yet on studies from EUS/ERCP. Elevated CA 19-9 makes an underlying malignancy the most likely etiology for his symptoms.  When they were contacted regarding his results by Dr. Rush Landmark yesterday his wife indicated concern in regards to altered mental status and possibly worsening jaundice.  He was instructed to present to the emergency department.  Total bili 20.8 up from 16.9, alk phos 388 up from 289, ALT 344 up from 51, and AST 799 up from 50.  All comparisons from 9 days ago.  Leukocytosis of 19.5.  Hemoglobin low at 6.5 g.  Platelets low at 106K.  INR elevated at 1.5.  Blood cultures pending.  CT scan abdomen and pelvis without contrast:  IMPRESSION: 1. Interval development of a large heterogeneous fluid collection in the right lobe of the liver in segment 7 measuring 7.8 x 6.9 x 8.7 cm, concerning for hepatic abscess since it is unlikely a metastasis this large would have developed in 10 days. 2. There is a new 2.2 cm fluid collection in segment 4B on 3:25 which could also be an abscess. 3. There are 2 additional nonspecific hypodensities in the right lobe of the liver  measuring 1.7 cm and 2.2 cm. I suspect these are also fluid collections although it is possible they could be metastases. MRI without and with contrast may be helpful. 4. Persistent severe intrahepatic biliary dilatation with common bile duct stent and small caliber stent in the proximal pancreatic duct. 5. Increased peripancreatic edema and generalized mesenteric edema, increased body wall anasarca and mild interval increased abdominopelvic ascites. 6. Stable 3 cm hypodense lesion in the pancreatic head. 7. Stable retroperitoneal and mesenteric root adenopathy. 8. New trace pleural effusions. 9. Increased splenomegaly. 10. Large heterogeneous mass in the bladder continues to be seen. 11. Diffuse colonic wall thickening concerning for colitis, scattered inflammatory changes over portions suspected. Otherwise possible this could be congestive or due to hepatic dysfunction. 12. Severe prostatomegaly. 13. Aortic atherosclerosis.   Aortic Atherosclerosis (ICD10-I70.0).   He has been started on Zosyn.  IR and ID have both been consulted.  He did receive a unit of packed red blood cells.  No family was at bedside.  Patient awake but still seemed very confused at times.  He reports dark stools at home for 8-12 weeks per his report to me.  Past Medical History:  Diagnosis Date   Abnormal CT scan, gallbladder 08/13/2020   Hypertension     Past Surgical History:  Procedure Laterality Date   BILIARY BRUSHING  05/13/2022   Procedure: BILIARY BRUSHING;  Surgeon: Rush Landmark Telford Nab., MD;  Location: [facility] ENDOSCOPY;  Service: Gastroenterology;;   BILIARY STENT PLACEMENT  05/13/2022   Procedure: BILIARY STENT PLACEMENT;  Surgeon: Irving Copas.,  MD;  Location: [place];  Service: Gastroenterology;;   BIOPSY  05/13/2022   Procedure: BIOPSY;  Surgeon: Irving Copas., MD;  Location: [place];  Service: Gastroenterology;;   ERCP N/A 05/13/2022   Procedure: ENDOSCOPIC  RETROGRADE CHOLANGIOPANCREATOGRAPHY (ERCP);  Surgeon: Irving Copas., MD;  Location: [place];  Service: Gastroenterology;  Laterality: N/A;   ESOPHAGOGASTRODUODENOSCOPY N/A 05/13/2022   Procedure: ESOPHAGOGASTRODUODENOSCOPY (EGD);  Surgeon: Irving Copas., MD;  Location: [place];  Service: Gastroenterology;  Laterality: N/A;   EUS N/A 05/13/2022   Procedure: FULL UPPER ENDOSCOPIC ULTRASOUND (EUS) RADIAL;  Surgeon: Rush Landmark Telford Nab., MD;  Location: [place];  Service: Gastroenterology;  Laterality: N/A;   FINE NEEDLE ASPIRATION  05/13/2022   Procedure: FINE NEEDLE ASPIRATION (FNA) LINEAR;  Surgeon: Irving Copas., MD;  Location: [place];  Service: Gastroenterology;;   PANCREATIC STENT PLACEMENT  05/13/2022   Procedure: PANCREATIC STENT PLACEMENT;  Surgeon: Irving Copas., MD;  Location: [place];  Service: Gastroenterology;;   REMOVAL OF STONES  05/13/2022   Procedure: REMOVAL OF STONES;  Surgeon: Irving Copas., MD;  Location: [place];  Service: Gastroenterology;;   [name]  05/13/2022   Procedure: [name];  Surgeon: Irving Copas., MD;  Location: [place];  Service: Gastroenterology;;   TRANSURETHRAL RESECTION OF BLADDER TUMOR N/A 06/30/2020   Procedure:  [place] Free OF BLEEDING, CLOT EVACUATION;  Surgeon: Ardis Hughs, MD;  Location: [facility];  Service: Urology;  Laterality: N/A;   TRANSURETHRAL RESECTION OF BLADDER TUMOR N/A 08/13/2020   Procedure: CYSTOSCOPY, CLOT EVACUATION, FULGERATION;  Surgeon: Ardis Hughs, MD;  Location: [facility];  Service: Urology;  Laterality: N/A;    Prior to Admission medications   Medication Sig Start Date End Date Taking? Authorizing Provider  cetirizine (ZYRTEC) 10 MG tablet Take 10 mg by mouth at bedtime. 05/06/22   [provider]  Emollient (EUCERIN) lotion Apply 1 Application topically as needed for dry skin (itching).    [provider]  famotidine (PEPCID) 40 MG tablet Take 1 tablet (40 mg total) by mouth daily. 05/17/22   Richarda Osmond, MD  ferrous sulfate 325 (65 FE) MG EC tablet Take 325 mg by mouth every Monday, Wednesday, and Friday. 02/22/22   [provider]  finasteride (PROSCAR) 5 MG tablet Take 1 tablet (5 mg total) by mouth daily. 07/02/20   Patrecia Pour, MD  folic acid (FOLVITE) 1 MG tablet Take 1 tablet (1 mg total) by mouth daily. 05/17/22 06/16/22  Richarda Osmond, MD  furosemide (LASIX) 40 MG tablet Take 1 tablet (40 mg total) by mouth daily. 05/17/22 06/16/22  Richarda Osmond, MD  hydrOXYzine (VISTARIL) 25 MG capsule Take 25 mg by mouth every 6 (six) hours as needed for itching. 05/06/22   [provider]  lidocaine (XYLOCAINE) 5 % ointment Apply 1 application topically 4 (four) times daily as needed for mild pain or moderate pain. 04/15/20   [provider]  sodium bicarbonate 650 MG tablet Take 1 tablet (650 mg total) by mouth 2 (two) times daily for 14 days. 05/17/22 05/31/22  Richarda Osmond, MD  tamsulosin (FLOMAX) 0.4 MG CAPS capsule Take 1 capsule (0.4 mg total) by mouth daily. 07/01/20   Patrecia Pour, MD  thiamine (VITAMIN B-1) 100 MG tablet Take 1 tablet (100 mg total) by mouth daily. 05/17/22 06/16/22  Richarda Osmond, MD  triamcinolone ointment (KENALOG) 0.1 % Apply 1 Application topically 2 (two) times daily. 05/06/22   [provider]    Current Facility-Administered Medications  Medication Dose Route Frequency Provider Last Rate Last Admin   Chlorhexidine Gluconate Cloth 2 % PADS 6 each  6 each Topical Daily Shalhoub, Sherryll Burger, MD       ondansetron [facility]) tablet 4 mg  4 mg Oral Q6H PRN Samella Parr, NP       Or   ondansetron (ZOFRAN) injection 4 mg  4 mg Intravenous Q6H PRN Samella Parr, NP       Oral care mouth rinse  15 mL Mouth Rinse PRN Shalhoub, Sherryll Burger, MD       piperacillin-tazobactam (ZOSYN) IVPB 3.375 g  3.375 g Intravenous Q8H  Bell, Michelle T, RPH       potassium chloride 10 mEq in 100 mL IVPB  10 mEq Intravenous Q1 Hr x 4 Erin Hearing L, NP       sodium chloride flush (NS) 0.9 % injection 3 mL  3 mL Intravenous Q12H Samella Parr, NP        Allergies as of 05/26/2022   (No Known Allergies)    History reviewed. No pertinent family history.  Social History   Socioeconomic History   Marital status: Married    Spouse name: Not on file   Number of children: Not on file   Years of education: Not on file   Highest education level: Not on file  Occupational History   Not on file  Tobacco Use   Smoking status: Every Day    Packs/day: 0.50    Years: 30.00    Total pack years: 15.00    Types: Cigarettes   Smokeless tobacco: Never  Vaping Use   Vaping Use: Never used  Substance and Sexual Activity   Alcohol use: Yes    Alcohol/week: 3.0 standard drinks of alcohol    Types: 3 Shots of liquor per week   Drug use: No   Sexual activity: Not on file  Other Topics Concern   Not on file  Social History Narrative   Not on file   Social Determinants of Health   Financial Resource Strain: Not on file  Food Insecurity: Not on file  Transportation Needs: Not on file  Physical Activity: Not on file  Stress: Not on file  Social Connections: Not on file  Intimate Partner Violence: Not on file    Review of Systems: ROS is O/W negative except as mentioned in HPI.  Physical Exam: Vital signs in last 24 hours: Temp:  [97.5 F (36.4 C)-99.6 F (37.6 C)] 98.6 F (37 C) (03/07 0847) Pulse Rate:  [79-84] 84 (03/07 0847) Resp:  [18-22] 18 (03/07 0847) BP: (98-121)/(51-58) 121/58 (03/07 0847) SpO2:  [94 %-100 %] 98 % (03/07 0847)   General:  Alert, Well-developed, well-nourished, pleasant and cooperative in NAD Head:  Normocephalic and atraumatic. Eyes:  Deep scleral icterus noted.. Ears:  Normal auditory acuity. Mouth:  No deformity or lesions.   Lungs:  Clear throughout to auscultation.  No  wheezes, crackles, or rhonchi.  Heart:  Regular rate and rhythm; no murmurs, clicks, rubs, or gallops. Abdomen:  Soft but distended.  BS present.  Some RUQ and epigastric TTP.    Msk:  Symmetrical without gross deformities. Pulses:  Normal pulses noted. Extremities:  Pitting edema in B/L LEs, R>L. Neurologic:  Alert, seems confused at times. Skin:  Intact without significant lesions or rashes.  Intake/Output this shift: Total I/O In: 1315 [Blood:315; IV Piggyback:1000] Out: -   Lab Results:  Recent Labs    05/26/22 0508  WBC 19.5*  HGB 6.5*  HCT 19.3*  PLT 106*   BMET Recent Labs    05/26/22 0508  NA 134*  K 2.5*  CL 103  CO2 18*  GLUCOSE 108*  BUN 80*  CREATININE 1.97*  CALCIUM 7.2*   LFT Recent Labs    05/26/22 0508  PROT 5.2*  ALBUMIN <1.5*  AST 799*  ALT 344*  ALKPHOS 388*  BILITOT 20.8*   PT/INR Recent Labs    05/26/22 0508  LABPROT 18.1*  INR 1.5*   Studies/Results: CT ABDOMEN PELVIS WO CONTRAST  Result Date: 05/26/2022 CLINICAL DATA:  Abdominal pain, weakness and jaundice. EXAM: CT ABDOMEN AND PELVIS WITHOUT CONTRAST TECHNIQUE: Multidetector CT imaging of the abdomen and pelvis was performed following the standard protocol without IV contrast. RADIATION DOSE REDUCTION: This exam was performed according to the departmental dose-optimization program which includes automated exposure control, adjustment of the mA and/or kV according to patient size and/or use of iterative reconstruction technique. COMPARISON:  CT with IV contrast 05/16/2022, 05/11/2022, and 03/21/2022. FINDINGS: Lower chest: Development of bilateral trace pleural effusions. Mild increased posterior atelectasis. No acute process. Bilateral gynecomastia. The cardiac size is normal. The cardiac blood pool is less dense than the myocardium consistent with anemia. Hepatobiliary: Again noted are common bile duct stent and a small caliber stent in the proximal pancreatic duct both terminating in  the duodenal. The gallbladder is fluid-filled but not dilated, unchanged with again noted thickened appearance to the gallbladder neck which is ill-defined and may infiltrate the adjacent liver. Severe intrahepatic biliary dilatation continues to be seen. There is interval development of a large heterogeneous fluid collection in the right lobe of the liver in segment 7 measuring 7.8 x 6.9 by 8.7 cm, concerning for hepatic abscess since it is unlikely a metastasis this large would have developed in 10 days. There is a small new fluid collection in segment 4B on 3:25 measuring 2.2 cm which could also be an abscess. There is also a new nonspecific hypodensity in segment 5 on 3:34 measuring 1.7 cm, and another in the more anterior aspect of segment 5 measuring 2.2 cm on 3:33. I suspect these are probably also fluid collections although it is possible they could be metastases. No other new abnormality in the liver is seen. Pancreas: Changes of proximal to mid peripancreatic edema are somewhat increased today but there is generalized increased mesenteric edema elsewhere as well. 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 A 3 cm hypodense lesion in the pancreatic head appears similar. No downstream ductal dilatation is visible. Small caliber stent in the proximal pancreatic duct is similar in positioning. Spleen: Increased splenomegaly, 15 cm coronal, previously 14.1 cm coronal. No focal abnormality without contrast. Adrenals/Urinary Tract: No adrenal or renal masses seen without contrast. No urinary stone or hydronephrosis. Large heterogeneous mass in the bladder continues to be seen. Stomach/Bowel: The stomach and small-bowel show no obvious abnormality. The appendix is normal caliber. There is wall thickening in the ascending, transverse and descending colon concerning for colitis, scattered inflammatory changes over portions suspected. Vascular/Lymphatic: Stable retroperitoneal and mesenteric root adenopathy.  No new adenopathy. Aortic atherosclerosis. Reproductive: Severe prostatomegaly. Other: Mild but interval increased ascites in the abdomen and pelvis. Increased generalized mesenteric edema and increased body wall anasarca. No free air. Musculoskeletal: No new or acute osseous findings. No destructive lesions. IMPRESSION: 1. Interval development of a large heterogeneous fluid collection in the right lobe of the liver in segment  7 measuring 7.8 x 6.9 x 8.7 cm, concerning for hepatic abscess since it is unlikely a metastasis this large would have developed in 10 days. 2. There is a new 2.2 cm fluid collection in segment 4B on 3:25 which could also be an abscess. 3. There are 2 additional nonspecific hypodensities in the right lobe of the liver measuring 1.7 cm and 2.2 cm. I suspect these are also fluid collections although it is possible they could be metastases. MRI without and with contrast may be helpful. 4. Persistent severe intrahepatic biliary dilatation with common bile duct stent and small caliber stent in the proximal pancreatic duct. 5. Increased peripancreatic edema and generalized mesenteric edema, increased body wall anasarca and mild interval increased abdominopelvic ascites. 6. Stable 3 cm hypodense lesion in the pancreatic head. 7. Stable retroperitoneal and mesenteric root adenopathy. 8. New trace pleural effusions. 9. Increased splenomegaly. 10. Large heterogeneous mass in the bladder continues to be seen. 11. Diffuse colonic wall thickening concerning for colitis, scattered inflammatory changes over portions suspected. Otherwise possible this could be congestive or due to hepatic dysfunction. 12. Severe prostatomegaly. 13. Aortic atherosclerosis. Aortic Atherosclerosis (ICD10-I70.0). Electronically Signed   By: Telford Nab M.D.   On: 05/26/2022 07:21   CT Head Wo Contrast  Result Date: 05/26/2022 CLINICAL DATA:  72 year old male with altered mental status. Increased weakness, jaundice,  abdominal pain. EXAM: CT HEAD WITHOUT CONTRAST TECHNIQUE: Contiguous axial images were obtained from the base of the skull through the vertex without intravenous contrast. RADIATION DOSE REDUCTION: This exam was performed according to the departmental dose-optimization program which includes automated exposure control, adjustment of the mA and/or kV according to patient size and/or use of iterative reconstruction technique. COMPARISON:  None Available. FINDINGS: Brain: Cerebral volume is within normal limits for age. No midline shift, ventriculomegaly, mass effect, evidence of mass lesion, intracranial hemorrhage or evidence of cortically based acute infarction. Gray-white matter differentiation is within normal limits throughout the brain. No encephalomalacia identified. Vascular: Mild Calcified atherosclerosis at the skull base. No suspicious intracranial vascular hyperdensity. Skull: No acute osseous abnormality identified. Sinuses/Orbits: Pronounced maxillary sinus mucoperiosteal thickening with superimposed fluid level and bubbly opacity. Other paranasal sinuses are fairly well aerated, only mild mucosal thickening elsewhere. Tympanic cavities and mastoids are clear. Other: Visualized orbits and scalp soft tissues are within normal limits. IMPRESSION: 1. Normal for age non contrast CT appearance of the brain. 2. 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 Electronically Signed   By: Genevie Ann M.D.   On: 05/26/2022 06:43   DG Chest Portable 1 View  Result Date: 05/26/2022 CLINICAL DATA:  Altered mental status, jaundice and weakness. EXAM: PORTABLE CHEST 1 VIEW COMPARISON:  PA chest 07/22/2010 FINDINGS: The lungs are expiratory with limited view of the bases. The visualized lungs are clear. The sulci are sharp. Heart size and vasculature are normal for the low lung volumes. The mediastinum is normally outlined. There is thoracic spondylosis. IMPRESSION: Expiratory exam with limited view of the bases. No evidence of  acute cardiopulmonary disease within study limitations. Electronically Signed   By: Telford Nab M.D.   On: 05/26/2022 06:27    IMPRESSION:  #15 72 year old African-American male who presented with jaundice, in setting of known bladder cancer, had also had a weight loss of about 20 pounds over the past 3 weeks   He was found on imaging to have severe intra and extrahepatic biliary ductal dilation. 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  Atypical cells found but no overt cancer found as of yet on studies from EUS/ERCP. Elevated CA 19-9 makes an underlying malignancy the most likely etiology for his symptoms.  Now with AMS and increasing total bili/LFTs.  CT scan showing findings of masses in the liver that are new and suspicious for liver abscesses.   #2 Hypokalemia:  K+ 2.5 #3 Severe hypoalbuminema with albumin < 1.5 #4 AKI with Cr 1.97 #5 Leukocytosis of 19 K likely related to #1.  He is afebrile. #6 Anemia somewhat chronic, Hgb 6.5 grams compared to 7.2 grams nine days ago.  Patient reported black stools at home.  Receiving a unit of PRBCs.  Iron studies, B12, and folate ok.   #7 Colonic wall thickening on CT scan likely due to hypoalbuminemia  PLAN: -IR and ID have both been consulted. -Agree with abx, on Zosyn currently. -Correct K+, hydration. -Trend labs.  Transfuse further prn. -? Re-attempt at ERCP vs IR intervention.  Laban Emperor. Zehr  05/26/2022, 9:39 AM  GI ATTENDING  History, laboratories, x-rays, endoscopy reports, pathology personally reviewed.  Patient seen and examined.  Agree with comprehensive consultation note as outlined above.  Also discussed the case with Dr. Rush Landmark.  Patient with obstructive jaundice secondary to mid duct stricture (pancreatic versus primary biliary).   Status post ERCP with stent.  Now presents with persistent jaundice and feeling ill with mental status changes.  Imaging reveals multiple hepatic abscesses.  Now status post drainage on antibiotics.  Biliary stent is in place, but is likely not functioning (suspected reocclusion).  Plan is for IV antibiotics, hydration, correction of electrolyte abnormalities, and general support.  At some point in the near future, we will plan ERCP with stent exchange.  If ineffective or unsuccessful, then would move toward IR biliary drainage.  Will follow.  Docia Chuck. Geri Seminole., M.D. [facility] Division of Gastroenterology

## 2022-05-26 NOTE — ED Notes (Signed)
No s/s of reaction. Pt denies any changes at this time. Call bell in reach

## 2022-05-26 NOTE — Progress Notes (Signed)
PHARMACY - PHYSICIAN COMMUNICATION CRITICAL VALUE ALERT - BLOOD CULTURE IDENTIFICATION (BCID)  Eddie Mendoza is an 72 y.o. male who presented to [facility] on 05/26/2022.  Assessment:  Multiple Hepatic abscesses following recent ERCP/EUS  with concerns for pancreatic vs primary biliary malignancy.   Microbiology: 3/7 Blood Cx:  - 2/4 (1 set) gram positive cocci in chains;  - 1/4 gram negative rods 3/7 BCID: Streptococcus species; Bacteroides fragilis  Name of physician (or Provider) Contacted: Raenette Rover   Current antibiotics:  3/7 zosyn >>   Changes to prescribed antibiotics recommended:  Patient is on recommended antibiotics - No changes needed  Results for orders placed or performed during the hospital encounter of 05/26/22  Blood Culture ID Panel (Reflexed) (Collected: 05/26/2022  5:37 AM)  Result Value Ref Range   Enterococcus faecalis NOT DETECTED NOT DETECTED   Enterococcus Faecium NOT DETECTED NOT DETECTED   Listeria monocytogenes NOT DETECTED NOT DETECTED   Staphylococcus species NOT DETECTED NOT DETECTED   Staphylococcus aureus (BCID) NOT DETECTED NOT DETECTED   Staphylococcus epidermidis NOT DETECTED NOT DETECTED   Staphylococcus lugdunensis NOT DETECTED NOT DETECTED   Streptococcus species DETECTED (A) NOT DETECTED   Streptococcus agalactiae NOT DETECTED NOT DETECTED   Streptococcus pneumoniae NOT DETECTED NOT DETECTED   Streptococcus pyogenes NOT DETECTED NOT DETECTED   A.calcoaceticus-baumannii NOT DETECTED NOT DETECTED   Bacteroides fragilis DETECTED (A) NOT DETECTED   Enterobacterales NOT DETECTED NOT DETECTED   Enterobacter cloacae complex NOT DETECTED NOT DETECTED   Escherichia coli NOT DETECTED NOT DETECTED   Klebsiella aerogenes NOT DETECTED NOT DETECTED   Klebsiella oxytoca NOT DETECTED NOT DETECTED   Klebsiella pneumoniae NOT DETECTED NOT DETECTED   Proteus species NOT DETECTED NOT DETECTED   Salmonella species NOT DETECTED NOT DETECTED    Serratia marcescens NOT DETECTED NOT DETECTED   Haemophilus influenzae NOT DETECTED NOT DETECTED   Neisseria meningitidis NOT DETECTED NOT DETECTED   Pseudomonas aeruginosa NOT DETECTED NOT DETECTED   Stenotrophomonas maltophilia NOT DETECTED NOT DETECTED   Candida albicans NOT DETECTED NOT DETECTED   Candida auris NOT DETECTED NOT DETECTED   Candida glabrata NOT DETECTED NOT DETECTED   Candida krusei NOT DETECTED NOT DETECTED   Candida parapsilosis NOT DETECTED NOT DETECTED   Candida tropicalis NOT DETECTED NOT DETECTED   Cryptococcus neoformans/gattii NOT DETECTED NOT DETECTED    [name] Pietro Cassis 05/26/2022  8:58 PM

## 2022-05-26 NOTE — Consult Note (Signed)
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 CT concerning for multiple hepatic abscess. GI and IR has been consulted   # Multiple Hepatic abscesses following recent ERCP/EUS  with concerns for pancreatic vs primary biliary malignancy  # Obstructive Jaundice 2/2 CBD stricture/abnormal LFTs: 2/25 hep panel negative  # Anemia/thrombocytopenia w concerns for GIB/Eelevated INR # Splenomegaly # Hypoalbuminemia/Ascites  # Bladder/prostate mass - defer further work up to primary and urology  # Encephalopathy - seems to be improved  # AKI   Recommendations  Continue zosyn as is Fu blood cultures  Monitor LFTs Fu GI and IR recs. Please send spirate asample for aerobic and anaerobic cultures as well as path as appropriate Monitor CBC and CMP, direct bilirubin added on  Avoid hepatotoxins Electrolyte abnormalities correction and transfusion requirements per primary  D/w primary  Following   Rest of the management as per the primary team. Please call with questions or concerns.  Thank you for  the consult  Rosiland Oz, MD Infectious Disease Physician [facility] [address] [place], [place] [address] Phone: [phone]  Fax: [phone]  __________________________________________________________________________________________________________ HPI and Hospital Course ( history mostly from chart review as he told me he is tired and wants to sleep and does not want to repeat the entire story) 72 year old male with PMH as below including TURBT, hypertension, alcohol abuse, bladder/prostate mass who presented to the ED with altered mental status, increased weakness jaundice and progressive abdominal pain.  Per wife, not doing well since discharged from the hospital on February 27 with weakness, confusion nausea and poor appetite with fevers up to 100.4.   At ED afebrile, leukocytosis up to 19.5, hb 6.5, platelets 106, INR 1.5, lactic acid 2.0, albumin <1.5, TB 20.8, AST 799, ALT 344, ALP 388   Recently discharged on 2/27 after presenting with jaundice and weight loss/itching. 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  Path with atypical cells found but no overt cancer. 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 EGD 2/23 with gastritis ( path with mild chronic inactive gastritis and chemical/reactive changes). He was discharged on ciprofloxacin for 5 days.  ROS: limited as patient asking to sleep because of being tired   Past Medical History:  Diagnosis Date   Abnormal CT scan, gallbladder 08/13/2020   Hypertension    Past Surgical History:  Procedure Laterality Date   BILIARY BRUSHING  05/13/2022   Procedure: BILIARY BRUSHING;  Surgeon: Rush Landmark Telford Nab., MD;  Location: [place];  Service: Gastroenterology;;    BILIARY STENT PLACEMENT  05/13/2022   Procedure: BILIARY STENT PLACEMENT;  Surgeon: Irving Copas., MD;  Location: [place];  Service: Gastroenterology;;   BIOPSY  05/13/2022   Procedure: BIOPSY;  Surgeon: Irving Copas., MD;  Location: [place];  Service: Gastroenterology;;   ERCP N/A 05/13/2022   Procedure: ENDOSCOPIC RETROGRADE CHOLANGIOPANCREATOGRAPHY (ERCP);  Surgeon: Irving Copas., MD;  Location: [place];  Service: Gastroenterology;  Laterality: N/A;   ESOPHAGOGASTRODUODENOSCOPY N/A 05/13/2022   Procedure: ESOPHAGOGASTRODUODENOSCOPY (EGD);  Surgeon: Irving Copas., MD;  Location: [place];  Service: Gastroenterology;  Laterality: N/A;   EUS N/A 05/13/2022   Procedure: FULL UPPER ENDOSCOPIC ULTRASOUND (EUS) RADIAL;  Surgeon: Rush Landmark Telford Nab., MD;  Location: [place];  Service: Gastroenterology;  Laterality: N/A;   FINE NEEDLE ASPIRATION  05/13/2022   Procedure: FINE NEEDLE ASPIRATION (FNA) LINEAR;  Surgeon: Irving Copas., MD;  Location: [place];  Service: Gastroenterology;;   PANCREATIC STENT PLACEMENT  05/13/2022   Procedure: PANCREATIC STENT PLACEMENT;  Surgeon: Irving Copas., MD;  Location: [place];  Service: Gastroenterology;;   REMOVAL OF STONES  05/13/2022   Procedure: REMOVAL OF STONES;  Surgeon: Irving Copas., MD;  Location: [place];  Service: Gastroenterology;;   [name]  05/13/2022   Procedure: [name];  Surgeon: Irving Copas., MD;  Location: [place];  Service: Gastroenterology;;   TRANSURETHRAL RESECTION OF BLADDER TUMOR N/A 06/30/2020   Procedure:  [place] Free OF BLEEDING, CLOT EVACUATION;  Surgeon: Ardis Hughs, MD;  Location: [facility];  Service: Urology;  Laterality: N/A;   TRANSURETHRAL RESECTION OF BLADDER TUMOR N/A 08/13/2020   Procedure: CYSTOSCOPY, CLOT EVACUATION, FULGERATION;  Surgeon: Ardis Hughs, MD;  Location: [facility];   Service: Urology;  Laterality: N/A;   Scheduled Meds:  sodium chloride flush  3 mL Intravenous Q12H   Continuous Infusions:  piperacillin-tazobactam (ZOSYN)  IV     potassium chloride 10 mEq (05/26/22 0754)   PRN Meds:.ondansetron **OR** ondansetron (ZOFRAN) IV  Social History   Socioeconomic History   Marital status: Married    Spouse name: Not on file   Number of children: Not on file   Years of education: Not on file   Highest education level: Not on file  Occupational History   Not on file  Tobacco Use   Smoking status: Every Day    Packs/day: 0.50    Years: 30.00    Total pack years: 15.00    Types: Cigarettes   Smokeless tobacco: Never  Vaping Use   Vaping Use: Never used  Substance and Sexual Activity   Alcohol use: Yes    Alcohol/week: 3.0 standard drinks of alcohol    Types: 3 Shots of liquor per week   Drug use: No   Sexual activity: Not on file  Other Topics Concern   Not on file  Social History Narrative   Not on file   Social Determinants of Health   Financial Resource Strain: Not on file  Food Insecurity: Not on file  Transportation Needs: Not on file  Physical Activity: Not on file  Stress: Not on file  Social  Connections: Not on file  Intimate Partner Violence: Not on file   History reviewed. No pertinent family history.  Vitals BP (!) 100/58   Pulse 81   Temp 98.6 F (37 C) (Oral)   Resp 19   SpO2 100%   Physical Exam Constitutional:  chronically ill looking     Comments: jaundiced, left eye [place] hemorrhage  Cardiovascular:     Rate and Rhythm: Normal rate and regular rhythm.     Heart sounds: s1s2  Pulmonary:     Effort: Pulmonary effort is normal on room air     Comments: Normal breath sounds  Abdominal:     Palpations: Abdomen is soft.     Tenderness: distended but no tenderness or guarding  Musculoskeletal:        General: No swelling or tenderness.   Skin:    Comments: No rashes   Neurological:     General: awake,  alert and oriented to name, place and person, month and year. Follows commands appropriately  Psychiatric:        Mood and Affect: Mood normal.    Pertinent Microbiology Results for orders placed or performed during the hospital encounter of 08/12/20  Resp Panel by RT-PCR (Flu A&B, Covid) Nasopharyngeal Swab     Status: None   Collection Time: 08/13/20  4:31 AM   Specimen: Nasopharyngeal Swab; Nasopharyngeal(NP) swabs in vial transport medium  Result Value Ref Range Status   SARS Coronavirus 2 by RT PCR NEGATIVE NEGATIVE Final    Comment: (NOTE) SARS-CoV-2 target nucleic acids are NOT DETECTED.  The SARS-CoV-2 RNA is generally detectable in upper respiratory specimens during the acute phase of infection. The lowest concentration of SARS-CoV-2 viral copies this assay can detect is 138 copies/mL. A negative result does not preclude SARS-Cov-2 infection and should not be used as the sole basis for treatment or other patient management decisions. A negative result may occur with  improper specimen collection/handling, submission of specimen other than nasopharyngeal swab, presence of viral mutation(s) within the areas targeted by this assay, and inadequate number of viral copies(<138 copies/mL). A negative result must be combined with clinical observations, patient history, and epidemiological information. The expected result is Negative.  Fact Sheet for Patients:  EntrepreneurPulse.com.au  Fact Sheet for Healthcare Providers:  IncredibleEmployment.be  This test is no t yet approved or cleared by the [place] FDA and  has been authorized for detection and/or diagnosis of SARS-CoV-2 by FDA under an Emergency Use Authorization (EUA). This EUA will remain  in effect (meaning this test can be used) for the duration of the COVID-19 declaration under Section 564(b)(1) of the Act, 21 U.S.C.section 360bbb-3(b)(1), unless the authorization is  terminated  or revoked sooner.       Influenza A by PCR NEGATIVE NEGATIVE Final   Influenza B by PCR NEGATIVE NEGATIVE Final    Comment: (NOTE) The Xpert Xpress SARS-CoV-2/FLU/RSV plus assay is intended as an aid in the diagnosis of influenza from Nasopharyngeal swab specimens and should not be used as a sole basis for treatment. Nasal washings and aspirates are unacceptable for Xpert Xpress SARS-CoV-2/FLU/RSV testing.  Fact Sheet for Patients: EntrepreneurPulse.com.au  Fact Sheet for Healthcare Providers: IncredibleEmployment.be  This test is not yet approved or cleared by the [place] FDA and has been authorized for detection and/or diagnosis of SARS-CoV-2 by FDA under an Emergency Use Authorization (EUA). This EUA will remain in effect (meaning this test can be used) for the duration of the COVID-19 declaration under  Section 564(b)(1) of the Act, 21 U.S.C. section 360bbb-3(b)(1), unless the authorization is terminated or revoked.  Performed at [facility], [place] [address], [place], [place] [address]   Surgical PCR screen     Status: Abnormal   Collection Time: 08/13/20  9:11 AM   Specimen: Nasal Mucosa; Nasal Swab  Result Value Ref Range Status   MRSA, PCR NEGATIVE NEGATIVE Final   Staphylococcus aureus POSITIVE (A) NEGATIVE Final    Comment: (NOTE) The Xpert SA Assay (FDA approved for NASAL specimens in patients 38 years of age and older), is one component of a comprehensive surveillance program. It is not intended to diagnose infection nor to guide or monitor treatment. Performed at [facility], [place] [address], [place], [place] [address]     Pertinent Lab seen by me:    Latest Ref Rng & Units 05/26/2022    5:08 AM 05/17/2022    3:24 AM 05/16/2022    2:24 AM  CBC  WBC 4.0 - 10.5 K/uL 19.5  14.8  19.0   Hemoglobin 13.0 - 17.0 g/dL 6.5  7.2  7.5   Hematocrit 39.0 - 52.0 % 19.3  19.8   20.9   Platelets 150 - 400 K/uL 106  382  374       Latest Ref Rng & Units 05/26/2022    5:08 AM 05/17/2022    3:24 AM 05/16/2022    2:24 AM  CMP  Glucose 70 - 99 mg/dL 108  128  105   BUN 8 - 23 mg/dL 80  18  18   Creatinine 0.61 - 1.24 mg/dL 1.97  1.17  1.09   Sodium 135 - 145 mmol/L 134  136  133   Potassium 3.5 - 5.1 mmol/L 2.5  3.0  3.4   Chloride 98 - 111 mmol/L 103  103  106   CO2 22 - 32 mmol/L '18  20  17   '$ Calcium 8.9 - 10.3 mg/dL 7.2  8.0  7.9   Total Protein 6.5 - 8.1 g/dL 5.2  5.2  5.3   Total Bilirubin 0.3 - 1.2 mg/dL 20.8  16.9  18.4   Alkaline Phos 38 - 126 U/L 388  289  331   AST 15 - 41 U/L 799  50  64   ALT 0 - 44 U/L 344  51  57     Pertinent Imagings/Other Imagings Plain films and CT images have been personally visualized and interpreted; radiology reports have been reviewed. Decision making incorporated into the Impression / Recommendations.  CT ABDOMEN PELVIS WO CONTRAST  Result Date: 05/26/2022 CLINICAL DATA:  Abdominal pain, weakness and jaundice. EXAM: CT ABDOMEN AND PELVIS WITHOUT CONTRAST TECHNIQUE: Multidetector CT imaging of the abdomen and pelvis was performed following the standard protocol without IV contrast. RADIATION DOSE REDUCTION: This exam was performed according to the departmental dose-optimization program which includes automated exposure control, adjustment of the mA and/or kV according to patient size and/or use of iterative reconstruction technique. COMPARISON:  CT with IV contrast 05/16/2022, 05/11/2022, and 03/21/2022. FINDINGS: Lower chest: Development of bilateral trace pleural effusions. Mild increased posterior atelectasis. No acute process. Bilateral gynecomastia. The cardiac size is normal. The cardiac blood pool is less dense than the myocardium consistent with anemia. Hepatobiliary: Again noted are common bile duct stent and a small caliber stent in the proximal pancreatic duct both terminating in the duodenal. The gallbladder is  fluid-filled but not dilated, unchanged with again noted thickened appearance to the  gallbladder neck which is ill-defined and may infiltrate the adjacent liver. Severe intrahepatic biliary dilatation continues to be seen. There is interval development of a large heterogeneous fluid collection in the right lobe of the liver in segment 7 measuring 7.8 x 6.9 by 8.7 cm, concerning for hepatic abscess since it is unlikely a metastasis this large would have developed in 10 days. There is a small new fluid collection in segment 4B on 3:25 measuring 2.2 cm which could also be an abscess. There is also a new nonspecific hypodensity in segment 5 on 3:34 measuring 1.7 cm, and another in the more anterior aspect of segment 5 measuring 2.2 cm on 3:33. I suspect these are probably also fluid collections although it is possible they could be metastases. No other new abnormality in the liver is seen. Pancreas: Changes of proximal to mid peripancreatic edema are somewhat increased today but there is generalized increased mesenteric edema elsewhere as well. 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 A 3 cm hypodense lesion in the pancreatic head appears similar. No downstream ductal dilatation is visible. Small caliber stent in the proximal pancreatic duct is similar in positioning. Spleen: Increased splenomegaly, 15 cm coronal, previously 14.1 cm coronal. No focal abnormality without contrast. Adrenals/Urinary Tract: No adrenal or renal masses seen without contrast. No urinary stone or hydronephrosis. Large heterogeneous mass in the bladder continues to be seen. Stomach/Bowel: The stomach and small-bowel show no obvious abnormality. The appendix is normal caliber. There is wall thickening in the ascending, transverse and descending colon concerning for colitis, scattered inflammatory changes over portions suspected. Vascular/Lymphatic: Stable retroperitoneal and mesenteric root adenopathy. No new adenopathy. Aortic  atherosclerosis. Reproductive: Severe prostatomegaly. Other: Mild but interval increased ascites in the abdomen and pelvis. Increased generalized mesenteric edema and increased body wall anasarca. No free air. Musculoskeletal: No new or acute osseous findings. No destructive lesions. IMPRESSION: 1. Interval development of a large heterogeneous fluid collection in the right lobe of the liver in segment 7 measuring 7.8 x 6.9 x 8.7 cm, concerning for hepatic abscess since it is unlikely a metastasis this large would have developed in 10 days. 2. There is a new 2.2 cm fluid collection in segment 4B on 3:25 which could also be an abscess. 3. There are 2 additional nonspecific hypodensities in the right lobe of the liver measuring 1.7 cm and 2.2 cm. I suspect these are also fluid collections although it is possible they could be metastases. MRI without and with contrast may be helpful. 4. Persistent severe intrahepatic biliary dilatation with common bile duct stent and small caliber stent in the proximal pancreatic duct. 5. Increased peripancreatic edema and generalized mesenteric edema, increased body wall anasarca and mild interval increased abdominopelvic ascites. 6. Stable 3 cm hypodense lesion in the pancreatic head. 7. Stable retroperitoneal and mesenteric root adenopathy. 8. New trace pleural effusions. 9. Increased splenomegaly. 10. Large heterogeneous mass in the bladder continues to be seen. 11. Diffuse colonic wall thickening concerning for colitis, scattered inflammatory changes over portions suspected. Otherwise possible this could be congestive or due to hepatic dysfunction. 12. Severe prostatomegaly. 13. Aortic atherosclerosis. Aortic Atherosclerosis (ICD10-I70.0). Electronically Signed   By: Telford Nab M.D.   On: 05/26/2022 07:21   CT Head Wo Contrast  Result Date: 05/26/2022 CLINICAL DATA:  72 year old male with altered mental status. Increased weakness, jaundice, abdominal pain. EXAM: CT HEAD  WITHOUT CONTRAST TECHNIQUE: Contiguous axial images were obtained from the base of the skull through the vertex without intravenous contrast. RADIATION DOSE  REDUCTION: This exam was performed according to the departmental dose-optimization program which includes automated exposure control, adjustment of the mA and/or kV according to patient size and/or use of iterative reconstruction technique. COMPARISON:  None Available. FINDINGS: Brain: Cerebral volume is within normal limits for age. No midline shift, ventriculomegaly, mass effect, evidence of mass lesion, intracranial hemorrhage or evidence of cortically based acute infarction. Gray-white matter differentiation is within normal limits throughout the brain. No encephalomalacia identified. Vascular: Mild Calcified atherosclerosis at the skull base. No suspicious intracranial vascular hyperdensity. Skull: No acute osseous abnormality identified. Sinuses/Orbits: Pronounced maxillary sinus mucoperiosteal thickening with superimposed fluid level and bubbly opacity. Other paranasal sinuses are fairly well aerated, only mild mucosal thickening elsewhere. Tympanic cavities and mastoids are clear. Other: Visualized orbits and scalp soft tissues are within normal limits. IMPRESSION: 1. Normal for age non contrast CT appearance of the brain. 2. 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 Electronically Signed   By: Genevie Ann M.D.   On: 05/26/2022 06:43   DG Chest Portable 1 View  Result Date: 05/26/2022 CLINICAL DATA:  Altered mental status, jaundice and weakness. EXAM: PORTABLE CHEST 1 VIEW COMPARISON:  PA chest 07/22/2010 FINDINGS: The lungs are expiratory with limited view of the bases. The visualized lungs are clear. The sulci are sharp. Heart size and vasculature are normal for the low lung volumes. The mediastinum is normally outlined. There is thoracic spondylosis. IMPRESSION: Expiratory exam with limited view of the bases. No evidence of acute cardiopulmonary disease  within study limitations. Electronically Signed   By: Telford Nab M.D.   On: 05/26/2022 06:27   VAS [place] LOWER EXTREMITY VENOUS (DVT)  Result Date: 05/16/2022  Lower Venous DVT Study Patient Name:  JERMERE MESCHKE  Date of Exam:   05/16/2022 Medical Rec #: [id number]         Accession #:    [id number] Date of Birth: Nov 18, 1950          Patient Gender: M Patient Age:   17 years Exam Location:  [facility] Procedure:      VAS [place] LOWER EXTREMITY VENOUS (DVT) Referring Phys: Bonnielee Haff --------------------------------------------------------------------------------  Indications: Edema.  Limitations: Poor ultrasound/tissue interface. Comparison Study: No previous exams Performing Technologist: Jody Hill RVT, RDMS  Examination Guidelines: A complete evaluation includes B-mode imaging, spectral Doppler, color Doppler, and power Doppler as needed of all accessible portions of each vessel. Bilateral testing is considered an integral part of a complete examination. Limited examinations for reoccurring indications may be performed as noted. The reflux portion of the exam is performed with the patient in reverse Trendelenburg.  +---------+---------------+---------+-----------+----------+--------------+ RIGHT    CompressibilityPhasicitySpontaneityPropertiesThrombus Aging +---------+---------------+---------+-----------+----------+--------------+ CFV      Full           No       Yes                                 +---------+---------------+---------+-----------+----------+--------------+ SFJ      Full                                                        +---------+---------------+---------+-----------+----------+--------------+ FV Prox  Full           Yes      Yes                                 +---------+---------------+---------+-----------+----------+--------------+  FV Mid   Full           Yes      Yes                                  +---------+---------------+---------+-----------+----------+--------------+ FV DistalFull           Yes      Yes                                 +---------+---------------+---------+-----------+----------+--------------+ PFV      Full                                                        +---------+---------------+---------+-----------+----------+--------------+ POP      Full           Yes      Yes                                 +---------+---------------+---------+-----------+----------+--------------+ PTV      Full                                                        +---------+---------------+---------+-----------+----------+--------------+ PERO     Full                                                        +---------+---------------+---------+-----------+----------+--------------+   +---------+---------------+---------+-----------+----------+--------------+ LEFT     CompressibilityPhasicitySpontaneityPropertiesThrombus Aging +---------+---------------+---------+-----------+----------+--------------+ CFV      Full           Yes      Yes                                 +---------+---------------+---------+-----------+----------+--------------+ SFJ      Full                                                        +---------+---------------+---------+-----------+----------+--------------+ FV Prox  Full           Yes      Yes                                 +---------+---------------+---------+-----------+----------+--------------+ FV Mid   Full           Yes      Yes                                 +---------+---------------+---------+-----------+----------+--------------+ FV DistalFull  Yes      Yes                                 +---------+---------------+---------+-----------+----------+--------------+ PFV      Full                                                         +---------+---------------+---------+-----------+----------+--------------+ POP      Full           Yes      Yes                                 +---------+---------------+---------+-----------+----------+--------------+ PTV      Full                                                        +---------+---------------+---------+-----------+----------+--------------+ PERO     Full                                                        +---------+---------------+---------+-----------+----------+--------------+     Summary: BILATERAL: - No evidence of deep vein thrombosis seen in the lower extremities, bilaterally. -No evidence of popliteal cyst, bilaterally.   *See table(s) above for measurements and observations. Electronically signed by Servando Snare MD on 05/16/2022 at 5:18:19 PM.    Final    CT ABDOMEN PELVIS W CONTRAST  Result Date: 05/16/2022 CLINICAL DATA:  Biliary stent deployment with elevated white count, difficult ERCP. EXAM: CT ABDOMEN AND PELVIS WITH CONTRAST TECHNIQUE: Multidetector CT imaging of the abdomen and pelvis was performed using the standard protocol following bolus administration of intravenous contrast. RADIATION DOSE REDUCTION: This exam was performed according to the departmental dose-optimization program which includes automated exposure control, adjustment of the mA and/or kV according to patient size and/or use of iterative reconstruction technique. CONTRAST:  41m OMNIPAQUE IOHEXOL 350 MG/ML SOLN COMPARISON:  CT with IV contrast 05/11/2022, CT with IV contrast 03/21/2022. FINDINGS: Lower chest: No acute abnormality. Hepatobiliary: Respiratory motion limits image quality as well as artifact from the patient's arms in the field. Gallbladder is fluid-filled but again not dilated. Ill-defined thickening in the gallbladder neck is again noted on 3: 30-33. Similar to the last CT there is severe intrahepatic biliary dilatation, with no appreciable improvement despite  interval stent placement from the intrahepatic ductal confluence through the CBD into the duodenum. A short wire or small caliber stent extends from the CBD stent into the proximal pancreatic duct. There is scattered air in the dilated bile ducts which could be due to the ERCP and stent placement or infectious process. No liver mass or hepatic abscess is seen. Pancreas: Allowing for respiratory motion there is probably increased proximal peripancreatic edema, concerning for pancreatitis. A short wire or in proximal pancreatic duct is noted. There is no downstream ductal dilatation. 3 x 2.5 cm cystic lesion again is  noted of the pancreatic head. Spleen: Mildly prominent measuring 14.1 cm coronal, previously 13.5 cm coronal. No mass or abscess. Adrenals/Urinary Tract: No adrenal or renal mass is seen, no urinary stone or obstruction. There is symmetric delayed phase renal excretion. There is a large heterogeneous mass redemonstrated in the bladder. There is elsewhere mild generalized thickening of the bladder. Stomach/Bowel: No bowel obstruction or inflammatory change is seen. The appendix is normal. Vascular/Lymphatic: Stable retroperitoneal adenopathy, largest index lymph node in the left periaortic chain again measuring 2 cm in short axis. Aortic atherosclerosis. Reproductive: Severe prostatomegaly. Other: Mild free ascites in the abdomen and pelvis.  No free air. Musculoskeletal: No new findings. IMPRESSION: 1. Similar to the last CT there is severe intrahepatic biliary dilatation, but with interval stent placement from the intrahepatic ductal confluence through the CBD into the duodenum. There is scattered air in the dilated bile ducts which could be due to the ERCP and stent placement or infectious process. Ill-defined thickening in the gallbladder neck appears similar. 2. Allowing for respiratory motion, there is probably increased proximal peripancreatic edema, concerning for pancreatitis. 3. 3 x 2.5 cm cystic  lesion of the pancreatic head, unchanged. 4. Large heterogeneous bladder mass redemonstrated. 5. Mild free fluid in the abdomen and pelvis. 6. Severe prostatomegaly. 7. Stable retroperitoneal adenopathy. 8. Aortic atherosclerosis. Aortic Atherosclerosis (ICD10-I70.0). Electronically Signed   By: Telford Nab M.D.   On: 05/16/2022 02:56   DG ERCP  Result Date: 05/14/2022 CLINICAL DATA:  72 year old male with history of biliary obstruction. EXAM: ERCP TECHNIQUE: Multiple spot images obtained with the fluoroscopic device and submitted for interpretation post-procedure. FLUOROSCOPY TIME:  81.7 mGy COMPARISON:  CT abdomen pelvis from 05/11/2022 FINDINGS: Retrograde cannulation of the common bile duct into the intrahepatic ducts and cholangiogram demonstrates severe intrahepatic biliary duct dilation with obstructive hilar mass. A plastic biliary stent is placed. IMPRESSION: 1. Severe biliary obstruction at the level of the hilum. 2. Common bile duct stent was placed. These images were submitted for radiologic interpretation only. Please see the procedural report for the full procedural details, amount of contrast, and the fluoroscopy time utilized. Ruthann Cancer, MD [facility] Electronically Signed   By: Ruthann Cancer M.D.   On: 05/14/2022 08:06   CT ABDOMEN PELVIS W CONTRAST  Result Date: 05/12/2022 CLINICAL DATA:  Biliary obstruction suspected (Ped [id number]) EXAM: CT ABDOMEN AND PELVIS WITH CONTRAST TECHNIQUE: Multidetector CT imaging of the abdomen and pelvis was performed using the standard protocol following bolus administration of intravenous contrast. RADIATION DOSE REDUCTION: This exam was performed according to the departmental dose-optimization program which includes automated exposure control, adjustment of the mA and/or kV according to patient size and/or use of iterative reconstruction technique. CONTRAST:  20m OMNIPAQUE IOHEXOL 350 MG/ML  SOLN COMPARISON:  CT abdomen pelvis 03/21/2022 FINDINGS: Lower chest: No acute abnormality. Hepatobiliary: No focal liver abnormality. Poorly visualized gallbladder with persistent gallbladder wall thickening around the neck (3:24). Interval worsening of severe intra and extrahepatic biliary ductal dilatation. Pancreas: Persistent stable 2.9 x 2.4 cm proximal pancreatic duct hypodense lesion. Slightly hazy pancreatic contour proximally with trace fat stranding. No main pancreatic ductal dilatation. Spleen: Normal in size without focal abnormality. Adrenals/Urinary Tract: No adrenal nodule bilaterally. Bilateral kidneys enhance symmetrically. No hydronephrosis. No hydroureter. Redemonstration of a heterogeneous masslike intraluminal lesion within the urinary bladder. Persistent urine bladder wall thickening. On delayed imaging, there is no urothelial wall thickening and there are no filling defects in the opacified portions of  the bilateral collecting systems or ureters. Stomach/Bowel: Stomach is within normal limits. No evidence of small bowel wall thickening or dilatation. No large bowel dilatation. Diffuse large bowel wall thickening. No pericolonic fat stranding. Scattered colonic diverticula. Appendix appears normal. Vascular/Lymphatic: No abdominal aorta or iliac aneurysm. Mild atherosclerotic plaque of the aorta and its branches. Persistent retroperitoneal lymphadenopathy as an example a 1.8 cm left periaortic lymph node (3:34). Difficult to delineate suggestion of portacaval lymphadenopathy (3:18). Reproductive: The prostate is enlarged and heterogeneous measuring up to 7.3 cm. Other: Interval increase in trace simple fluid ascites. No intraperitoneal free gas. No organized fluid collection. Musculoskeletal: No abdominal wall hernia or abnormality. No suspicious lytic or blastic osseous lesions. No acute displaced fracture. Multilevel degenerative changes of the spine. IMPRESSION: 1. Question development of  acute pancreatitis. Correlate with lipase levels. 2. Proximal pancreatic 2.9 cm cystic mass with interval worsening of severe intra and extrahepatic biliary ductal dilatation. Persistent poorly visualized gallbladder with gallbladder neck thickening. 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 When the patient is clinically stable and able to follow directions and hold their breath (preferably as an outpatient) further evaluation with dedicated MRI with and without contrast should be considered. 3. Persistent heterogeneous masslike intraluminal lesion within the urinary bladder. Finding could represent a urinary bladder or prostatic mass versus blood products. Recommend urologic consultation. 4. Marked Prostatomegaly and heterogeneity of the prostate gland. 5. Retroperitoneal and likely portacaval lymphadenopathy. 6. Colonic diverticulosis with no acute diverticulitis. 7. Question diffuse colonic bowel thickening with no definite findings of colitis. 8. Trace simple rib fluid ascites. Electronically Signed   By: Iven Finn M.D.   On: 05/12/2022 00:14    I spent 90 minutes for this patient encounter including review of prior medical records/discussing diagnostics and treatment plan with the patient/family/coordinate care with primary/other specialits with greater than 50% of time in face to face encounter.   Electronically signed by:   Rosiland Oz, MD Infectious Disease Physician [facility] Pager: [phone]

## 2022-05-26 NOTE — ED Provider Notes (Signed)
[facility] Provider Note   CSN: [id number] Arrival date & time: 05/26/22  0451     History  Chief Complaint  Patient presents with   Weakness    Eddie Mendoza is a 72 y.o. male.  Patient in by wife with generalized weakness and confusion.  He has a history of pancreatic and biliary mass as well as bladder mass.  Currently not receiving any chemotherapy or radiation.  Wife states not known if it is true cancer yet.  He was discharged from the hospital on February 27 and has not been doing well at home.  He has been generally weak, confused, eating, not drinking, having nausea but no vomiting.  They report low grade fever at home to up to 100.4.  Called GI doctor yesterday who referred him to the hospital.  There is concern for worsening liver function and failure to thrive.  Wife denies any fall.  Patient has not been eating or drinking very well at home since being discharged on February 27.  He is complains of pain all over especially to his abdomen.  He denies any chest pain or shortness of breath.  Wife reports worsening yellowing of his eyes and skin.  Also has noticed redness to his left eye.  He has been more confused and repeating himself.  The history is provided by the spouse and the patient. The history is limited by the condition of the patient.  Weakness Associated symptoms: abdominal pain, arthralgias, myalgias and nausea   Associated symptoms: no chest pain, no cough, no dysuria, no fever, no shortness of breath and no vomiting        Home Medications Prior to Admission medications   Medication Sig Start Date End Date Taking? Authorizing Provider  cetirizine (ZYRTEC) 10 MG tablet Take 10 mg by mouth at bedtime. 05/06/22   [provider]  Emollient (EUCERIN) lotion Apply 1 Application topically as needed for dry skin (itching).    [provider]  famotidine (PEPCID) 40 MG tablet Take 1 tablet (40 mg  total) by mouth daily. 05/17/22   Richarda Osmond, MD  ferrous sulfate 325 (65 FE) MG EC tablet Take 325 mg by mouth every Monday, Wednesday, and Friday. 02/22/22   [provider]  finasteride (PROSCAR) 5 MG tablet Take 1 tablet (5 mg total) by mouth daily. 07/02/20   Patrecia Pour, MD  folic acid (FOLVITE) 1 MG tablet Take 1 tablet (1 mg total) by mouth daily. 05/17/22 06/16/22  Richarda Osmond, MD  furosemide (LASIX) 40 MG tablet Take 1 tablet (40 mg total) by mouth daily. 05/17/22 06/16/22  Richarda Osmond, MD  hydrOXYzine (VISTARIL) 25 MG capsule Take 25 mg by mouth every 6 (six) hours as needed for itching. 05/06/22   [provider]  lidocaine (XYLOCAINE) 5 % ointment Apply 1 application topically 4 (four) times daily as needed for mild pain or moderate pain. 04/15/20   [provider]  sodium bicarbonate 650 MG tablet Take 1 tablet (650 mg total) by mouth 2 (two) times daily for 14 days. 05/17/22 05/31/22  Richarda Osmond, MD  tamsulosin (FLOMAX) 0.4 MG CAPS capsule Take 1 capsule (0.4 mg total) by mouth daily. 07/01/20   Patrecia Pour, MD  thiamine (VITAMIN B-1) 100 MG tablet Take 1 tablet (100 mg total) by mouth daily. 05/17/22 06/16/22  Richarda Osmond, MD  triamcinolone ointment (KENALOG) 0.1 % Apply 1 Application topically 2 (two)  times daily. 05/06/22   [provider]      Allergies    Patient has no known allergies.    Review of Systems   Review of Systems  Constitutional:  Positive for activity change, appetite change and fatigue. Negative for fever.  HENT:  Negative for congestion and rhinorrhea.   Respiratory:  Negative for cough, chest tightness and shortness of breath.   Cardiovascular:  Positive for leg swelling. Negative for chest pain.  Gastrointestinal:  Positive for abdominal pain and nausea. Negative for vomiting.  Genitourinary:  Negative for dysuria and hematuria.  Musculoskeletal:  Positive for arthralgias and myalgias.   Skin:  Negative for rash.  Neurological:  Positive for weakness.    all other systems are negative except as noted in the HPI and PMH.   Physical Exam Updated Vital Signs BP (!) 98/52 (BP Location: Right Arm)   Pulse 79   Temp (!) 97.5 F (36.4 C) (Oral)   Resp (!) 22   SpO2 94%  Physical Exam Vitals and nursing note reviewed.  Constitutional:      General: He is in acute distress.     Appearance: He is well-developed. He is ill-appearing.     Comments: Chronic ill-appearing, jaundiced  HENT:     Head: Normocephalic and atraumatic.     Mouth/Throat:     Mouth: Mucous membranes are dry.     Pharynx: No oropharyngeal exudate.  Eyes:     Conjunctiva/sclera: Conjunctivae normal.     Pupils: Pupils are equal, round, and reactive to light.  Neck:     Comments: No meningismus. Cardiovascular:     Rate and Rhythm: Normal rate and regular rhythm.     Heart sounds: Normal heart sounds. No murmur heard. Pulmonary:     Effort: Pulmonary effort is normal. No respiratory distress.     Breath sounds: Normal breath sounds.  Chest:     Chest wall: No tenderness.  Abdominal:     General: There is distension.     Palpations: Abdomen is soft.     Tenderness: There is abdominal tenderness. There is no guarding or rebound.     Comments: Mild diffuse tenderness, no guarding or rebound  Musculoskeletal:        General: No tenderness. Normal range of motion.     Cervical back: Normal range of motion and neck supple.     Right lower leg: Edema present.     Left lower leg: Edema present.     Comments: RLE>LLE  Skin:    General: Skin is warm.  Neurological:     Mental Status: He is alert.     Cranial Nerves: No cranial nerve deficit.     Motor: No abnormal muscle tone.     Coordination: Coordination normal.     Comments: Oriented to person and place.  Moves all extremities equally, follows commands.  No asterixis  Psychiatric:        Behavior: Behavior normal.     ED Results /  Procedures / Treatments   Labs (all labs ordered are listed, but only abnormal results are displayed) Labs Reviewed  CBC WITH DIFFERENTIAL/PLATELET - Abnormal; Notable for the following components:      Result Value   WBC 19.5 (*)    RBC 2.20 (*)    Hemoglobin 6.5 (*)    HCT 19.3 (*)    RDW 20.8 (*)    Platelets 106 (*)    Neutro Abs 16.3 (*)    Lymphs  Abs 0.5 (*)    Monocytes Absolute 2.1 (*)    Abs Immature Granulocytes 0.65 (*)    All other components within normal limits  COMPREHENSIVE METABOLIC PANEL - Abnormal; Notable for the following components:   Sodium 134 (*)    Potassium 2.5 (*)    CO2 18 (*)    Glucose, Bld 108 (*)    BUN 80 (*)    Creatinine, Ser 1.97 (*)    Calcium 7.2 (*)    Total Protein 5.2 (*)    Albumin <1.5 (*)    AST 799 (*)    ALT 344 (*)    Alkaline Phosphatase 388 (*)    Total Bilirubin 20.8 (*)    GFR, Estimated 35 (*)    All other components within normal limits  LACTIC ACID, PLASMA - Abnormal; Notable for the following components:   Lactic Acid, Venous 2.0 (*)    All other components within normal limits  PROTIME-INR - Abnormal; Notable for the following components:   Prothrombin Time 18.1 (*)    INR 1.5 (*)    All other components within normal limits  LIPASE, BLOOD - Abnormal; Notable for the following components:   Lipase 60 (*)    All other components within normal limits  POC OCCULT BLOOD, ED - Normal  CULTURE, BLOOD (ROUTINE X 2)  CULTURE, BLOOD (ROUTINE X 2)  LACTIC ACID, PLASMA  AMMONIA  URINALYSIS, ROUTINE W REFLEX MICROSCOPIC  VITAMIN B12  FOLATE  IRON AND TIBC  FERRITIN  RETICULOCYTES  CBG MONITORING, ED  TYPE AND SCREEN  PREPARE RBC (CROSSMATCH)    EKG EKG Interpretation  Date/Time:  Thursday May 26 2022 05:09:57 EST Ventricular Rate:  76 PR Interval:  218 QRS Duration: 148 QT Interval:  431 QTC Calculation: 485 R Axis:   -51 Text Interpretation: Sinus rhythm Borderline prolonged PR interval Nonspecific  IVCD with LAD Left ventricular hypertrophy No significant change was found Confirmed by Ezequiel Essex [phone]) on 05/26/2022 5:19:35 AM  Radiology CT ABDOMEN PELVIS WO CONTRAST  Result Date: 05/26/2022 CLINICAL DATA:  Abdominal pain, weakness and jaundice. EXAM: CT ABDOMEN AND PELVIS WITHOUT CONTRAST TECHNIQUE: Multidetector CT imaging of the abdomen and pelvis was performed following the standard protocol without IV contrast. RADIATION DOSE REDUCTION: This exam was performed according to the departmental dose-optimization program which includes automated exposure control, adjustment of the mA and/or kV according to patient size and/or use of iterative reconstruction technique. COMPARISON:  CT with IV contrast 05/16/2022, 05/11/2022, and 03/21/2022. FINDINGS: Lower chest: Development of bilateral trace pleural effusions. Mild increased posterior atelectasis. No acute process. Bilateral gynecomastia. The cardiac size is normal. The cardiac blood pool is less dense than the myocardium consistent with anemia. Hepatobiliary: Again noted are common bile duct stent and a small caliber stent in the proximal pancreatic duct both terminating in the duodenal. The gallbladder is fluid-filled but not dilated, unchanged with again noted thickened appearance to the gallbladder neck which is ill-defined and may infiltrate the adjacent liver. Severe intrahepatic biliary dilatation continues to be seen. There is interval development of a large heterogeneous fluid collection in the right lobe of the liver in segment 7 measuring 7.8 x 6.9 by 8.7 cm, concerning for hepatic abscess since it is unlikely a metastasis this large would have developed in 10 days. There is a small new fluid collection in segment 4B on 3:25 measuring 2.2 cm which could also be an abscess. There is also a new nonspecific hypodensity in segment 5 on 3:34  measuring 1.7 cm, and another in the more anterior aspect of segment 5 measuring 2.2 cm on 3:33. I  suspect these are probably also fluid collections although it is possible they could be metastases. No other new abnormality in the liver is seen. Pancreas: Changes of proximal to mid peripancreatic edema are somewhat increased today but there is generalized increased mesenteric edema elsewhere as well. 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 A 3 cm hypodense lesion in the pancreatic head appears similar. No downstream ductal dilatation is visible. Small caliber stent in the proximal pancreatic duct is similar in positioning. Spleen: Increased splenomegaly, 15 cm coronal, previously 14.1 cm coronal. No focal abnormality without contrast. Adrenals/Urinary Tract: No adrenal or renal masses seen without contrast. No urinary stone or hydronephrosis. Large heterogeneous mass in the bladder continues to be seen. Stomach/Bowel: The stomach and small-bowel show no obvious abnormality. The appendix is normal caliber. There is wall thickening in the ascending, transverse and descending colon concerning for colitis, scattered inflammatory changes over portions suspected. Vascular/Lymphatic: Stable retroperitoneal and mesenteric root adenopathy. No new adenopathy. Aortic atherosclerosis. Reproductive: Severe prostatomegaly. Other: Mild but interval increased ascites in the abdomen and pelvis. Increased generalized mesenteric edema and increased body wall anasarca. No free air. Musculoskeletal: No new or acute osseous findings. No destructive lesions. IMPRESSION: 1. Interval development of a large heterogeneous fluid collection in the right lobe of the liver in segment 7 measuring 7.8 x 6.9 x 8.7 cm, concerning for hepatic abscess since it is unlikely a metastasis this large would have developed in 10 days. 2. There is a new 2.2 cm fluid collection in segment 4B on 3:25 which could also be an abscess. 3. There are 2 additional nonspecific hypodensities in the right lobe of the liver measuring 1.7 cm and 2.2 cm. I  suspect these are also fluid collections although it is possible they could be metastases. MRI without and with contrast may be helpful. 4. Persistent severe intrahepatic biliary dilatation with common bile duct stent and small caliber stent in the proximal pancreatic duct. 5. Increased peripancreatic edema and generalized mesenteric edema, increased body wall anasarca and mild interval increased abdominopelvic ascites. 6. Stable 3 cm hypodense lesion in the pancreatic head. 7. Stable retroperitoneal and mesenteric root adenopathy. 8. New trace pleural effusions. 9. Increased splenomegaly. 10. Large heterogeneous mass in the bladder continues to be seen. 11. Diffuse colonic wall thickening concerning for colitis, scattered inflammatory changes over portions suspected. Otherwise possible this could be congestive or due to hepatic dysfunction. 12. Severe prostatomegaly. 13. Aortic atherosclerosis. Aortic Atherosclerosis (ICD10-I70.0). Electronically Signed   By: Telford Nab M.D.   On: 05/26/2022 07:21   CT Head Wo Contrast  Result Date: 05/26/2022 CLINICAL DATA:  72 year old male with altered mental status. Increased weakness, jaundice, abdominal pain. EXAM: CT HEAD WITHOUT CONTRAST TECHNIQUE: Contiguous axial images were obtained from the base of the skull through the vertex without intravenous contrast. RADIATION DOSE REDUCTION: This exam was performed according to the departmental dose-optimization program which includes automated exposure control, adjustment of the mA and/or kV according to patient size and/or use of iterative reconstruction technique. COMPARISON:  None Available. FINDINGS: Brain: Cerebral volume is within normal limits for age. No midline shift, ventriculomegaly, mass effect, evidence of mass lesion, intracranial hemorrhage or evidence of cortically based acute infarction. Gray-white matter differentiation is within normal limits throughout the brain. No encephalomalacia identified.  Vascular: Mild Calcified atherosclerosis at the skull base. No suspicious intracranial vascular hyperdensity. Skull: No acute osseous abnormality  identified. Sinuses/Orbits: Pronounced maxillary sinus mucoperiosteal thickening with superimposed fluid level and bubbly opacity. Other paranasal sinuses are fairly well aerated, only mild mucosal thickening elsewhere. Tympanic cavities and mastoids are clear. Other: Visualized orbits and scalp soft tissues are within normal limits. IMPRESSION: 1. Normal for age non contrast CT appearance of the brain. 2. 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 Electronically Signed   By: Genevie Ann M.D.   On: 05/26/2022 06:43   DG Chest Portable 1 View  Result Date: 05/26/2022 CLINICAL DATA:  Altered mental status, jaundice and weakness. EXAM: PORTABLE CHEST 1 VIEW COMPARISON:  PA chest 07/22/2010 FINDINGS: The lungs are expiratory with limited view of the bases. The visualized lungs are clear. The sulci are sharp. Heart size and vasculature are normal for the low lung volumes. The mediastinum is normally outlined. There is thoracic spondylosis. IMPRESSION: Expiratory exam with limited view of the bases. No evidence of acute cardiopulmonary disease within study limitations. Electronically Signed   By: Telford Nab M.D.   On: 05/26/2022 06:27    Procedures .Critical Care  Performed by: Ezequiel Essex, MD Authorized by: Ezequiel Essex, MD   Critical care provider statement:    Critical care time (minutes):  45   Critical care time was exclusive of:  Separately billable procedures and treating other patients   Critical care was necessary to treat or prevent imminent or life-threatening deterioration of the following conditions:  Renal failure, dehydration, sepsis and CNS failure or compromise   Critical care was time spent personally by me on the following activities:  Development of treatment plan with patient or surrogate, discussions with consultants, evaluation of  patient's response to treatment, examination of patient, ordering and review of laboratory studies, ordering and review of radiographic studies, ordering and performing treatments and interventions, pulse oximetry, re-evaluation of patient's condition, review of old charts, blood draw for specimens and obtaining history from patient or surrogate   I assumed direction of critical care for this patient from another provider in my specialty: no     Care discussed with: admitting provider       Medications Ordered in ED Medications  lactated ringers bolus 1,000 mL (has no administration in time range)    ED Course/ Medical Decision Making/ A&P                             Medical Decision Making Amount and/or Complexity of Data Reviewed Independent Historian: spouse Labs: ordered. Decision-making details documented in ED Course. Radiology: ordered and independent interpretation performed. Decision-making details documented in ED Course. ECG/medicine tests: ordered and independent interpretation performed. Decision-making details documented in ED Course.  Risk Prescription drug management. Decision regarding hospitalization.   Patient with recently diagnosed pancreatic cancer with biliary stent in place presenting with generalized weakness and failure to thrive.  Very weak on arrival and confused.  Not febrile.  Follows commands.  Abdomen soft without peritoneal signs.  Gastroenterology records reviewed.  They request repeat labs and imaging and likely admission to the hospital.  Will initiate IV fluids, obtain labs, chest x-ray, urinalysis.  Workup shows hypokalemia of 2.5, total bilirubin 20.8. Worsening transaminitis.  Creatinine 1.9.  BUN 80. hemoglobin 6.5 down from 8.  CT head without acute findings. Results reviewed and interpreted by me.  UA pending.   Multiple laboratory abnormalities as above.  CT a/p concerning for multiple fluid collections and possible hepatic  abscesses. Stable pancreas and bladder mass. Similar biliary tree  dilation   Will initiate IV zosyn with possible abscesses and concern for possible cholangitis. [facility] notified of admission and Dr. Tarri Glenn states they will see today.  May need IR evaluation for consideration of draining liver fluid collections.  Transfusing 1 unit of pRBCs.   D/w Dr. Olevia Bowens.          Final Clinical Impression(s) / ED Diagnoses Final diagnoses:  Elevated bilirubin  Metabolic encephalopathy  AKI (acute kidney injury) [facility])    Rx / DC Orders ED Discharge Orders     None         Jia Mohamed, Annie Main, MD 05/26/22 [phone]

## 2022-05-26 NOTE — Procedures (Signed)
Interventional Radiology Procedure Note  Procedure: [place] RT LIVER ABSCESS DRAIN    Complications: None  Estimated Blood Loss:  MIN  Findings: 110CC EXUDATIVE BILIOUS FLD. CX SENT 10FR DRAIN TO SUCTION BULB    Tamera Punt, MD

## 2022-05-26 NOTE — H&P (Signed)
History and Physical    Patient: Eddie Mendoza [id number] DOB: [birth year] DOA: 05/26/2022 DOS: the patient was seen and examined on 05/26/2022 PCP: [facility], [facility]  Patient coming from: Home with wife  Chief Complaint:  Chief Complaint  Patient presents with   Weakness   HPI: Eddie Mendoza is a 72 y.o. male with medical history significant of hypertension and prior COVID-19 as well as recent diagnosis of pancreatic, biliary masses as well as bladder mass.  He has been followed by Dr. Rush Landmark with GI.  He was recently hospitalized and discharged on 2/27 after evaluation for elevated LFTs at which time he was found to have a pancreatic mass bladder mass and an enlarged prostate.  During that admission he was noted with hematuria.  He was evaluated by urology who recommended outpatient biopsy of the bladder mass.  GI did perform an inpatient EUS and ERCP on 2/23 that demonstrated a significant common bile duct stricture.  A stent was placed in the pancreatic duct and the common bile duct.  Biopsy from the pancreatic cyst was collected but pathology was pending at time of discharge.  He was also continued on antibiotics due to the obstructive process.  He also has a reported history of alcohol use disorder.  He had previously presented to the ER on 1/124 for abdominal pain and hematuria at that time CT was concerning for bladder neoplasm, gallbladder/biliary neoplasm, cystic lesions in the pancreatic head and metastatic disease.  Patient refused hospitalization and left AMA.    On 3/6 gastroenterology office was finally able to contact the patient and his wife.  The results of the EUS and ERCP were discussed with notation of only atypical cells being found but it was emphasized that the patient had an elevated CA 19-9 making underlying malignancy most likely etiology to his symptoms.  The patient and wife explained that since her recent hospitalization he has developed significant  jaundice and scleral icterus with darkened urine.  The wife described his eyes as being bloodshot.  He had been having chills and low-grade fevers as well as progressive abdominal pain.  Given patient's history and progression of symptoms patient was instructed to go to either [facility] or [facility] since there are no ERCP capabilities at [facility].  Patient and wife agreed.  Patient presented to [facility] ER where a workup was initiated.  Lab work in the ER revealed significant increase in patient's liver enzymes with alkaline phosphatase 388, lipase 60, AST 799 previously 50, ALT 344 previously 51.  Lactic acid was elevated 2, WBCs elevated at 19,500 with a left shift.  Hemoglobin had decreased to 6.5 from a previous of 7.5.  Coags were elevated with a PT of 18.1 and an INR of 1.5.  Due to concerns voiced by the wife of patient's altered mental status a CT of the head was obtained and did not show any acute changes concerning for possible stroke or metastatic disease.  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  Abdominal CT revealed interval development of a large heterogeneous fluid collection in the right lobe of the liver measuring 7.8 x 6.9 x 8.7 cm concerning for hepatic abscess as it is unlikely metastasis this large would develop to 10 days.  There was also a smaller 2.2 cm fluid collection which could also be an abscess.  There were other smaller hypodensities that could reflect either metastases or abscess.  Radiologist recommended MRI with and without contrast.  No change in the severe intrahepatic biliary dilatation and the common bile duct stent and small caliber stent in the proximal pancreatic duct remains patent.  Patient with increased splenomegaly.  Stable mass in the bladder there was also diffuse colonic wall thickening concerning for colitis.  [facility]  Hospitalist service was asked to admit the  patient.  In talking with the patient he does report worsening of symptoms over the past several days with new right upper quadrant abdominal pain he also reports decreased energy and decreased ability to ambulate.  He states the jaundice is new.  He was noted to have a left lateral scleral hemorrhage.  He denied nausea but did report dark red stools for several days.  This complex case was discussed with my attending physician and given the concern over hepatic abscess ID was consulted.  ID recommended consulting interventional radiology for possible drainage of this abscess and collection of fluid cultures.  ID also recommended utilization of IV Zosyn. I did notify GI that patient is also reporting dark red stools and have ordered fecal occult blood collections.  Review of Systems: As mentioned in the history of present illness. All other systems reviewed and are negative. Past Medical History:  Diagnosis Date   Abnormal CT scan, gallbladder 08/13/2020   Hypertension    Past Surgical History:  Procedure Laterality Date   BILIARY BRUSHING  05/13/2022   Procedure: BILIARY BRUSHING;  Surgeon: Rush Landmark Telford Nab., MD;  Location: [place];  Service: Gastroenterology;;   BILIARY STENT PLACEMENT  05/13/2022   Procedure: BILIARY STENT PLACEMENT;  Surgeon: Irving Copas., MD;  Location: [place];  Service: Gastroenterology;;   BIOPSY  05/13/2022   Procedure: BIOPSY;  Surgeon: Irving Copas., MD;  Location: [place];  Service: Gastroenterology;;   ERCP N/A 05/13/2022   Procedure: ENDOSCOPIC RETROGRADE CHOLANGIOPANCREATOGRAPHY (ERCP);  Surgeon: Irving Copas., MD;  Location: [place];  Service: Gastroenterology;  Laterality: N/A;   ESOPHAGOGASTRODUODENOSCOPY N/A 05/13/2022   Procedure: ESOPHAGOGASTRODUODENOSCOPY (EGD);  Surgeon: Irving Copas., MD;  Location: [place];  Service: Gastroenterology;  Laterality: N/A;   EUS N/A 05/13/2022   Procedure:  FULL UPPER ENDOSCOPIC ULTRASOUND (EUS) RADIAL;  Surgeon: Rush Landmark Telford Nab., MD;  Location: [place];  Service: Gastroenterology;  Laterality: N/A;   FINE NEEDLE ASPIRATION  05/13/2022   Procedure: FINE NEEDLE ASPIRATION (FNA) LINEAR;  Surgeon: Irving Copas., MD;  Location: [place];  Service: Gastroenterology;;   PANCREATIC STENT PLACEMENT  05/13/2022   Procedure: PANCREATIC STENT PLACEMENT;  Surgeon: Irving Copas., MD;  Location: [place];  Service: Gastroenterology;;   REMOVAL OF STONES  05/13/2022   Procedure: REMOVAL OF STONES;  Surgeon: Irving Copas., MD;  Location: [place];  Service: Gastroenterology;;   [name]  05/13/2022   Procedure: [name];  Surgeon: Irving Copas., MD;  Location: [place];  Service: Gastroenterology;;   TRANSURETHRAL RESECTION OF BLADDER TUMOR N/A 06/30/2020   Procedure:  [place] Free OF BLEEDING, CLOT EVACUATION;  Surgeon: Ardis Hughs, MD;  Location: [facility];  Service: Urology;  Laterality: N/A;   TRANSURETHRAL RESECTION OF BLADDER TUMOR N/A 08/13/2020   Procedure: CYSTOSCOPY, CLOT EVACUATION, FULGERATION;  Surgeon: Ardis Hughs, MD;  Location: [facility];  Service: Urology;  Laterality: N/A;   Social History:  reports that he has been smoking cigarettes. He has a 15.00 pack-year smoking history. He has never used smokeless tobacco. He reports current alcohol use of about 3.0 standard drinks of alcohol per  week. He reports that he does not use drugs.  No Known Allergies  History reviewed. No pertinent family history.  Prior to Admission medications   Medication Sig Start Date End Date Taking? Authorizing Provider  cetirizine (ZYRTEC) 10 MG tablet Take 10 mg by mouth at bedtime. 05/06/22   [provider]  Emollient (EUCERIN) lotion Apply 1 Application topically as needed for dry skin (itching).    [provider]  famotidine (PEPCID) 40 MG tablet Take 1 tablet (40  mg total) by mouth daily. 05/17/22   Richarda Osmond, MD  ferrous sulfate 325 (65 FE) MG EC tablet Take 325 mg by mouth every Monday, Wednesday, and Friday. 02/22/22   [provider]  finasteride (PROSCAR) 5 MG tablet Take 1 tablet (5 mg total) by mouth daily. 07/02/20   Patrecia Pour, MD  folic acid (FOLVITE) 1 MG tablet Take 1 tablet (1 mg total) by mouth daily. 05/17/22 06/16/22  Richarda Osmond, MD  furosemide (LASIX) 40 MG tablet Take 1 tablet (40 mg total) by mouth daily. 05/17/22 06/16/22  Richarda Osmond, MD  hydrOXYzine (VISTARIL) 25 MG capsule Take 25 mg by mouth every 6 (six) hours as needed for itching. 05/06/22   [provider]  lidocaine (XYLOCAINE) 5 % ointment Apply 1 application topically 4 (four) times daily as needed for mild pain or moderate pain. 04/15/20   [provider]  sodium bicarbonate 650 MG tablet Take 1 tablet (650 mg total) by mouth 2 (two) times daily for 14 days. 05/17/22 05/31/22  Richarda Osmond, MD  tamsulosin (FLOMAX) 0.4 MG CAPS capsule Take 1 capsule (0.4 mg total) by mouth daily. 07/01/20   Patrecia Pour, MD  thiamine (VITAMIN B-1) 100 MG tablet Take 1 tablet (100 mg total) by mouth daily. 05/17/22 06/16/22  Richarda Osmond, MD  triamcinolone ointment (KENALOG) 0.1 % Apply 1 Application topically 2 (two) times daily. 05/06/22   [provider]    Physical Exam: Vitals:   05/26/22 [id number] 05/26/22 0738 05/26/22 0822 05/26/22 0847  BP:  (!) 113/51 (!) 100/58 (!) 121/58  Pulse:  81 81 84  Resp:  '19 19 18  '$ Temp: 99.6 F (37.6 C) 98.6 F (37 C) 98.6 F (37 C) 98.6 F (37 C)  TempSrc: Rectal Oral Oral Oral  SpO2:  98% 100% 98%   Constitutional: NAD, calm, comfortable-toxic in appearance Eyes: PERRL, bilateral scleral icterus with left lateral scleral hemorrhagic change ENMT: Mucous membranes are dry. Posterior pharynx clear of any exudate or lesions. Neck: normal, supple, no masses, no thyromegaly Respiratory:  clear to auscultation bilaterally, no wheezing, no crackles but lungs are diminished in the bases. Normal respiratory effort. No accessory muscle use.  Stable on room air with O2 sats between 99 and 100% Cardiovascular: Regular rate and rhythm, no murmurs / rubs / gallops.  Trace to 1+ bilateral lower extremity pitting edema.  2+ pedal pulses.  Abdomen: Focal right upper quadrant tenderness with minimal guarding but no rebounding, no masses palpated.  Abdomen is distended and taut making it difficult to truly appreciate any hepatosplenomegaly especially with ongoing abdominal pain in the right upper quadrant bowel sounds positive.  Musculoskeletal: no clubbing / cyanosis. No joint deformity upper and lower extremities. Good ROM, no contractures. Normal muscle tone.  Skin: no rashes, lesions, ulcers. No induration Neurologic: CN 2-12 grossly intact speech is difficult to understand but patient is otherwise alert without any focal neurological deficits. Sensation intact, Strength 3+/5 x all  4 extremities.  Psychiatric: Normal judgment and insight. Alert and oriented x 3. Normal mood.    Data Reviewed:  As per HPI  Assessment and Plan: Acute hepatic failure secondary to obstructing mass [facility] has been consulted for ERCP with spyglass procedure.  Will keep patient n.p.o. On clinical exam patient has significant findings of icterus, jaundice and is noted significant rapid elevation in hepatic enzymes likely due to obstructing mass. 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 in the setting of pancreatic mass See above regarding acute hepatic failure due to obstructing mass CBGs have been stable evidence of hyperglycemia Patient does have history of ongoing tobacco abuse  Bladder mass with previous hematuria Urology recommended outpatient biopsy May need to consider reconsult to determine if this needs to be done in the inpatient setting Currently voiding without difficulty and utilizing a male pure wick catheter  Normocytic anemia with patient reporting dark red stools Hemoglobin at presentation 6.5 with previous hemoglobin 7.5 Patient reporting dark red stools so we will check fecal occult blood Continue H2 blocker and add Protonix every 12 hours Patient was on vitamin [id number] folic acid and iron replacement prior to admission.  Anemia panel completed today demonstrates an iron of 33 TIBC of 186 and a markedly elevated ferritin of 1174 consistent with acute stress reactant Of note vitamin B12 elevated as well at 1498-possibly an absorption issue and may need to transition from oral to IM route at time of discharge  Acute hypokalemia Likely due to patient utilization of Lasix prior to admission Although patient has mild peripheral edema with pitting this is likely third spacing from hypoalbuminemia Patient currently n.p.o. so we will replace potassium with IV runs  Hypertension Hold Lasix and any other antihypertensive medications given current blood pressure readings in the 90s Suspect third spacing of fluids from significant hypoalbuminemia contributing  History of chronic alcohol use CIWA protocol with Ativan Patient normalized forthright about alcohol use and during previous admission stated he had not been using alcohol for several weeks but the wife reported ongoing alcohol use Wife not in room at time of evaluation      Advance Care Planning:   Code Status: Full Code   Consults: Gastroenterology, interventional radiology, and  infectious disease  Family Communication: Patient only, wife was not at the bedside  Severity of Illness: The appropriate patient status for this patient is INPATIENT. Inpatient status is judged to be reasonable and necessary in order to provide the required intensity of service to ensure the patient's safety. The patient's presenting symptoms, physical exam findings, and initial radiographic and laboratory data in the context of their chronic comorbidities is felt to place them at high risk for further clinical deterioration. Furthermore, it is not anticipated that the patient will be medically stable for discharge from the hospital within 2 midnights of admission.   * I certify that at the point of admission it is my clinical judgment that the patient will require inpatient hospital care spanning beyond 2 midnights from the point of admission due to high intensity of service, high risk for further deterioration and high  frequency of surveillance required.*  Author: Erin Hearing, NP 05/26/2022 10:41 AM  For on call review [email or web address].

## 2022-05-27 DIAGNOSIS — K750 Abscess of liver: Secondary | ICD-10-CM | POA: Diagnosis not present

## 2022-05-27 DIAGNOSIS — K831 Obstruction of bile duct: Secondary | ICD-10-CM | POA: Diagnosis not present

## 2022-05-27 DIAGNOSIS — D72829 Elevated white blood cell count, unspecified: Secondary | ICD-10-CM | POA: Diagnosis not present

## 2022-05-27 DIAGNOSIS — R7989 Other specified abnormal findings of blood chemistry: Secondary | ICD-10-CM

## 2022-05-27 DIAGNOSIS — D649 Anemia, unspecified: Secondary | ICD-10-CM

## 2022-05-27 LAB — COMPREHENSIVE METABOLIC PANEL
ALT: 228 U/L — ABNORMAL HIGH (ref 0–44)
ALT: 222 U/L — ABNORMAL HIGH (ref 0–44)
AST: 375 U/L — ABNORMAL HIGH (ref 15–41)
AST: 277 U/L — ABNORMAL HIGH (ref 15–41)
Albumin: <1.5 g/dL — ABNORMAL LOW (ref 3.5–5.0)
Albumin: <1.5 g/dL — ABNORMAL LOW (ref 3.5–5.0)
Alkaline Phosphatase: 293 U/L — ABNORMAL HIGH (ref 38–126)
Alkaline Phosphatase: 328 U/L — ABNORMAL HIGH (ref 38–126)
Anion gap: 7 (ref 5–15)
Anion gap: 9 (ref 5–15)
BUN: 80 mg/dL — ABNORMAL HIGH (ref 8–23)
BUN: 78 mg/dL — ABNORMAL HIGH (ref 8–23)
CO2: 16 mmol/L — ABNORMAL LOW (ref 22–32)
CO2: 17 mmol/L — ABNORMAL LOW (ref 22–32)
Calcium: 6.9 mg/dL — ABNORMAL LOW (ref 8.9–10.3)
Calcium: 7.4 mg/dL — ABNORMAL LOW (ref 8.9–10.3)
Chloride: 110 mmol/L (ref 98–111)
Chloride: 110 mmol/L (ref 98–111)
Creatinine, Ser: 1.64 mg/dL — ABNORMAL HIGH (ref 0.61–1.24)
Creatinine, Ser: 1.61 mg/dL — ABNORMAL HIGH (ref 0.61–1.24)
GFR, Estimated: 44 mL/min — ABNORMAL LOW (ref >60)
GFR, Estimated: 45 mL/min — ABNORMAL LOW (ref >60)
Glucose, Bld: 125 mg/dL — ABNORMAL HIGH (ref 70–99)
Glucose, Bld: 128 mg/dL — ABNORMAL HIGH (ref 70–99)
Potassium: 3.1 mmol/L — ABNORMAL LOW (ref 3.5–5.1)
Potassium: 2.6 mmol/L — CL (ref 3.5–5.1)
Sodium: 133 mmol/L — ABNORMAL LOW (ref 135–145)
Sodium: 136 mmol/L (ref 135–145)
Total Bilirubin: 16.3 mg/dL — ABNORMAL HIGH (ref 0.3–1.2)
Total Bilirubin: 17.9 mg/dL — ABNORMAL HIGH (ref 0.3–1.2)
Total Protein: 4.4 g/dL — ABNORMAL LOW (ref 6.5–8.1)
Total Protein: 5.4 g/dL — ABNORMAL LOW (ref 6.5–8.1)

## 2022-05-27 LAB — CBC WITH DIFFERENTIAL/PLATELET
Abs Immature Granulocytes: 0.42 10*3/uL — ABNORMAL HIGH (ref 0.00–0.07)
Basophils Absolute: 0.1 10*3/uL (ref 0.0–0.1)
Basophils Relative: 0 %
Eosinophils Absolute: 0.1 10*3/uL (ref 0.0–0.5)
Eosinophils Relative: 0 %
HCT: 28.2 % — ABNORMAL LOW (ref 39.0–52.0)
Hemoglobin: 9.6 g/dL — ABNORMAL LOW (ref 13.0–17.0)
Immature Granulocytes: 2 %
Lymphocytes Relative: 4 %
Lymphs Abs: 0.8 10*3/uL (ref 0.7–4.0)
MCH: 28.8 pg (ref 26.0–34.0)
MCHC: 34 g/dL (ref 30.0–36.0)
MCV: 84.7 fL (ref 80.0–100.0)
Monocytes Absolute: 1.7 10*3/uL — ABNORMAL HIGH (ref 0.1–1.0)
Monocytes Relative: 9 %
Neutro Abs: 16.3 10*3/uL — ABNORMAL HIGH (ref 1.7–7.7)
Neutrophils Relative %: 85 %
Platelets: 76 10*3/uL — ABNORMAL LOW (ref 150–400)
RBC: 3.33 MIL/uL — ABNORMAL LOW (ref 4.22–5.81)
RDW: 17.5 % — ABNORMAL HIGH (ref 11.5–15.5)
WBC: 19.3 10*3/uL — ABNORMAL HIGH (ref 4.0–10.5)
nRBC: 0 % (ref 0.0–0.2)

## 2022-05-27 LAB — CBC
HCT: 18.8 % — ABNORMAL LOW (ref 39.0–52.0)
Hemoglobin: 6.5 g/dL — CL (ref 13.0–17.0)
MCH: 30.4 pg (ref 26.0–34.0)
MCHC: 34.6 g/dL (ref 30.0–36.0)
MCV: 87.9 fL (ref 80.0–100.0)
Platelets: 65 10*3/uL — ABNORMAL LOW (ref 150–400)
RBC: 2.14 MIL/uL — ABNORMAL LOW (ref 4.22–5.81)
RDW: 19.3 % — ABNORMAL HIGH (ref 11.5–15.5)
WBC: 16.7 10*3/uL — ABNORMAL HIGH (ref 4.0–10.5)
nRBC: 0.1 % (ref 0.0–0.2)

## 2022-05-27 LAB — HEMOGLOBIN AND HEMATOCRIT, BLOOD
HCT: 27.7 % — ABNORMAL LOW (ref 39.0–52.0)
Hemoglobin: 9.7 g/dL — ABNORMAL LOW (ref 13.0–17.0)

## 2022-05-27 LAB — PHOSPHORUS: Phosphorus: 4.5 mg/dL (ref 2.5–4.6)

## 2022-05-27 LAB — LACTIC ACID, PLASMA: Lactic Acid, Venous: 1.4 mmol/L (ref 0.5–1.9)

## 2022-05-27 LAB — PREPARE RBC (CROSSMATCH)

## 2022-05-27 LAB — AMMONIA: Ammonia: 34 umol/L (ref 9–35)

## 2022-05-27 LAB — MAGNESIUM: Magnesium: 3.5 mg/dL — ABNORMAL HIGH (ref 1.7–2.4)

## 2022-05-27 LAB — PROTIME-INR
INR: 1.4 — ABNORMAL HIGH (ref 0.8–1.2)
Prothrombin Time: 16.8 seconds — ABNORMAL HIGH (ref 11.4–15.2)

## 2022-05-27 MED ORDER — PANTOPRAZOLE SODIUM 40 MG PO TBEC: 40.0000 mg | DELAYED_RELEASE_TABLET | Freq: Two times a day (BID) | ORAL | Status: DC

## 2022-05-27 MED ORDER — SODIUM CHLORIDE 0.9% IV SOLUTION: Freq: Once | INTRAVENOUS | Status: AC

## 2022-05-27 MED ORDER — POTASSIUM CHLORIDE CRYS ER 20 MEQ PO TBCR
40.0000 meq | EXTENDED_RELEASE_TABLET | Freq: Once | ORAL | Status: AC
  Administered 2022-05-27: 40 meq via ORAL
  Filled 2022-05-27: qty 2

## 2022-05-27 MED ORDER — LACTULOSE ENEMA
300.0000 mL | Freq: Once | ORAL | Status: AC
  Administered 2022-05-27: 300 mL via RECTAL
  Filled 2022-05-27: qty 300

## 2022-05-27 MED ORDER — PANTOPRAZOLE SODIUM 40 MG IV SOLR
40.0000 mg | Freq: Two times a day (BID) | INTRAVENOUS | Status: DC
  Administered 2022-05-27 – 2022-05-29 (×4): 40 mg via INTRAVENOUS
  Filled 2022-05-27 (×4): qty 10

## 2022-05-27 MED ORDER — POTASSIUM CHLORIDE 10 MEQ/100ML IV SOLN
10.0000 meq | INTRAVENOUS | Status: AC
  Administered 2022-05-27 (×3): 10 meq via INTRAVENOUS
  Filled 2022-05-27 (×4): qty 100

## 2022-05-27 MED ORDER — POTASSIUM CHLORIDE 2 MEQ/ML IV SOLN
INTRAVENOUS | Status: DC
  Filled 2022-05-27 (×8): qty 1000

## 2022-05-27 NOTE — Progress Notes (Signed)
Referring Physician(s): [place]  Supervising Physician: Michaelle Birks  Patient Status:  [facility] - In-pt  Chief Complaint: Jaundice, weight loss, weakness, hepatic fluid collections   Subjective: Pt remains lethargic, afebrile, bilious fluid in hepatic drain; some mild RUQ tenderness, receiving blood transfusion   Allergies: Patient has no known allergies.  Medications: Prior to Admission medications   Medication Sig Start Date End Date Taking? Authorizing Provider  cetirizine (ZYRTEC) 10 MG tablet Take 10 mg by mouth at bedtime. 05/06/22   [provider]  Emollient (EUCERIN) lotion Apply 1 Application topically as needed for dry skin (itching).    [provider]  famotidine (PEPCID) 40 MG tablet Take 1 tablet (40 mg total) by mouth daily. 05/17/22   Richarda Osmond, MD  ferrous sulfate 325 (65 FE) MG EC tablet Take 325 mg by mouth every Monday, Wednesday, and Friday. 02/22/22   [provider]  finasteride (PROSCAR) 5 MG tablet Take 1 tablet (5 mg total) by mouth daily. 07/02/20   Patrecia Pour, MD  folic acid (FOLVITE) 1 MG tablet Take 1 tablet (1 mg total) by mouth daily. 05/17/22 06/16/22  Richarda Osmond, MD  furosemide (LASIX) 40 MG tablet Take 1 tablet (40 mg total) by mouth daily. 05/17/22 06/16/22  Richarda Osmond, MD  hydrOXYzine (VISTARIL) 25 MG capsule Take 25 mg by mouth every 6 (six) hours as needed for itching. 05/06/22   [provider]  lidocaine (XYLOCAINE) 5 % ointment Apply 1 application topically 4 (four) times daily as needed for mild pain or moderate pain. 04/15/20   [provider]  sodium bicarbonate 650 MG tablet Take 1 tablet (650 mg total) by mouth 2 (two) times daily for 14 days. 05/17/22 05/31/22  Richarda Osmond, MD  tamsulosin (FLOMAX) 0.4 MG CAPS capsule Take 1 capsule (0.4 mg total) by mouth daily. 07/01/20   Patrecia Pour, MD  thiamine (VITAMIN B-1) 100 MG tablet Take 1 tablet (100 mg total) by  mouth daily. 05/17/22 06/16/22  Richarda Osmond, MD  triamcinolone ointment (KENALOG) 0.1 % Apply 1 Application topically 2 (two) times daily. 05/06/22   [provider]     Vital Signs: BP (!) 138/58   Pulse 66   Temp 97.6 F (36.4 C) (Axillary)   Resp 13   Ht 6' (1.829 m)   Wt 195 lb 15.8 oz (88.9 kg)   SpO2 95%   BMI 26.58 kg/m   Physical Exam lethargic; rt hepatic drain intact, insertion site ok, mildly tender, OP 440 cc bilious fluid  Imaging: IR US Guide Bx Asp/Drain  Result Date: 05/23/2022 INDICATION: HEPATIC ABSCESS, OBSTRUCTIVE JAUNDICE EXAM: ULTRASOUND RIGHT HEPATIC ABSCESS DRAIN PLACEMENT MEDICATIONS: The patient is currently admitted to the hospital and receiving intravenous antibiotics. The antibiotics were administered within an appropriate time frame prior to the initiation of the procedure. ANESTHESIA/SEDATION: Moderate (conscious) sedation was employed during this procedure. A total of Versed 0.5 mg and Fentanyl 50 mcg was administered intravenously by the radiology nurse. Total intra-service moderate Sedation Time: 10 minutes. The patient's level of consciousness and vital signs were monitored continuously by radiology nursing throughout the procedure under my direct supervision. COMPLICATIONS: None immediate. PROCEDURE: Informed written consent was obtained from the patient after a thorough discussion of the procedural risks, benefits and alternatives. All questions were addressed. Maximal Sterile Barrier Technique was utilized including caps, mask, sterile gowns, sterile gloves, sterile drape, hand hygiene and skin antiseptic. A timeout was performed prior to the  initiation of the procedure. previous imaging reviewed. preliminary ultrasound performed. the posterior right hepatic abscess was localized and marked through a lower intercostal space in the mid axillary line. Under sterile conditions and local anesthesia, percutaneous needle access performed of the  inferior right hepatic abscess with an 18 gauge 15 cm needle. Needle position confirmed with ultrasound. Images obtained for documentation. There was return of exudative bile compatible with abscess or infected biloma. Guidewire inserted. Guidewire position confirmed with ultrasound. Tract dilatation performed to insert a 10 [place] drain. Drain catheter position confirmed with ultrasound. Syringe aspiration yielded 115 cc exudative bilious fluid. Catheter secured with a silk suture connected to external suction bulb. Sterile dressing applied. No immediate complication. Patient tolerated the procedure well. IMPRESSION: Successful ultrasound-guided right hepatic abscess drainage catheter placement. Electronically Signed   By: Jerilynn Mages.  Shick M.D.   On: 06/08/2022 13:57   CT ABDOMEN PELVIS WO CONTRAST  Result Date: 05/25/2022 CLINICAL DATA:  Abdominal pain, weakness and jaundice. EXAM: CT ABDOMEN AND PELVIS WITHOUT CONTRAST TECHNIQUE: Multidetector CT imaging of the abdomen and pelvis was performed following the standard protocol without IV contrast. RADIATION DOSE REDUCTION: This exam was performed according to the departmental dose-optimization program which includes automated exposure control, adjustment of the mA and/or kV according to patient size and/or use of iterative reconstruction technique. COMPARISON:  CT with IV contrast 05/16/2022, 05/11/2022, and 03/21/2022. FINDINGS: Lower chest: Development of bilateral trace pleural effusions. Mild increased posterior atelectasis. No acute process. Bilateral gynecomastia. The cardiac size is normal. The cardiac blood pool is less dense than the myocardium consistent with anemia. Hepatobiliary: Again noted are common bile duct stent and a small caliber stent in the proximal pancreatic duct both terminating in the duodenal. The gallbladder is fluid-filled but not dilated, unchanged with again noted thickened appearance to the gallbladder neck which is ill-defined and may  infiltrate the adjacent liver. Severe intrahepatic biliary dilatation continues to be seen. There is interval development of a large heterogeneous fluid collection in the right lobe of the liver in segment 7 measuring 7.8 x 6.9 by 8.7 cm, concerning for hepatic abscess since it is unlikely a metastasis this large would have developed in 10 days. There is a small new fluid collection in segment 4B on 3:25 measuring 2.2 cm which could also be an abscess. There is also a new nonspecific hypodensity in segment 5 on 3:34 measuring 1.7 cm, and another in the more anterior aspect of segment 5 measuring 2.2 cm on 3:33. I suspect these are probably also fluid collections although it is possible they could be metastases. No other new abnormality in the liver is seen. Pancreas: Changes of proximal to mid peripancreatic edema are somewhat increased today but there is generalized increased mesenteric edema elsewhere as well. 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 A 3 cm hypodense lesion in the pancreatic head appears similar. No downstream ductal dilatation is visible. Small caliber stent in the proximal pancreatic duct is similar in positioning. Spleen: Increased splenomegaly, 15 cm coronal, previously 14.1 cm coronal. No focal abnormality without contrast. Adrenals/Urinary Tract: No adrenal or renal masses seen without contrast. No urinary stone or hydronephrosis. Large heterogeneous mass in the bladder continues to be seen. Stomach/Bowel: The stomach and small-bowel show no obvious abnormality. The appendix is normal caliber. There is wall thickening in the ascending, transverse and descending colon concerning for colitis, scattered inflammatory changes over portions suspected. Vascular/Lymphatic: Stable retroperitoneal and mesenteric root adenopathy. No new adenopathy. Aortic atherosclerosis. Reproductive: Severe prostatomegaly. Other: Mild  but interval increased ascites in the abdomen and pelvis. Increased  generalized mesenteric edema and increased body wall anasarca. No free air. Musculoskeletal: No new or acute osseous findings. No destructive lesions. IMPRESSION: 1. Interval development of a large heterogeneous fluid collection in the right lobe of the liver in segment 7 measuring 7.8 x 6.9 x 8.7 cm, concerning for hepatic abscess since it is unlikely a metastasis this large would have developed in 10 days. 2. There is a new 2.2 cm fluid collection in segment 4B on 3:25 which could also be an abscess. 3. There are 2 additional nonspecific hypodensities in the right lobe of the liver measuring 1.7 cm and 2.2 cm. I suspect these are also fluid collections although it is possible they could be metastases. MRI without and with contrast may be helpful. 4. Persistent severe intrahepatic biliary dilatation with common bile duct stent and small caliber stent in the proximal pancreatic duct. 5. Increased peripancreatic edema and generalized mesenteric edema, increased body wall anasarca and mild interval increased abdominopelvic ascites. 6. Stable 3 cm hypodense lesion in the pancreatic head. 7. Stable retroperitoneal and mesenteric root adenopathy. 8. New trace pleural effusions. 9. Increased splenomegaly. 10. Large heterogeneous mass in the bladder continues to be seen. 11. Diffuse colonic wall thickening concerning for colitis, scattered inflammatory changes over portions suspected. Otherwise possible this could be congestive or due to hepatic dysfunction. 12. Severe prostatomegaly. 13. Aortic atherosclerosis. Aortic Atherosclerosis (ICD10-I70.0). Electronically Signed   By: Telford Nab M.D.   On: 06/16/2022 07:21   CT Head Wo Contrast  Result Date: 06/14/2022 CLINICAL DATA:  72 year old male with altered mental status. Increased weakness, jaundice, abdominal pain. EXAM: CT HEAD WITHOUT CONTRAST TECHNIQUE: Contiguous axial images were obtained from the base of the skull through the vertex without intravenous  contrast. RADIATION DOSE REDUCTION: This exam was performed according to the departmental dose-optimization program which includes automated exposure control, adjustment of the mA and/or kV according to patient size and/or use of iterative reconstruction technique. COMPARISON:  None Available. FINDINGS: Brain: Cerebral volume is within normal limits for age. No midline shift, ventriculomegaly, mass effect, evidence of mass lesion, intracranial hemorrhage or evidence of cortically based acute infarction. Gray-white matter differentiation is within normal limits throughout the brain. No encephalomalacia identified. Vascular: Mild Calcified atherosclerosis at the skull base. No suspicious intracranial vascular hyperdensity. Skull: No acute osseous abnormality identified. Sinuses/Orbits: Pronounced maxillary sinus mucoperiosteal thickening with superimposed fluid level and bubbly opacity. Other paranasal sinuses are fairly well aerated, only mild mucosal thickening elsewhere. Tympanic cavities and mastoids are clear. Other: Visualized orbits and scalp soft tissues are within normal limits. IMPRESSION: 1. Normal for age non contrast CT appearance of the brain. 2. 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 Electronically Signed   By: Genevie Ann M.D.   On: 06/06/2022 06:43   DG Chest Portable 1 View  Result Date: 06/17/2022 CLINICAL DATA:  Altered mental status, jaundice and weakness. EXAM: PORTABLE CHEST 1 VIEW COMPARISON:  PA chest 07/22/2010 FINDINGS: The lungs are expiratory with limited view of the bases. The visualized lungs are clear. The sulci are sharp. Heart size and vasculature are normal for the low lung volumes. The mediastinum is normally outlined. There is thoracic spondylosis. IMPRESSION: Expiratory exam with limited view of the bases. No evidence of acute cardiopulmonary disease within study limitations. Electronically Signed   By: Telford Nab M.D.   On: 05/25/2022 06:27    Labs:  CBC: Recent Labs     05/17/22 0324 06/09/2022  [id number] 06/09/2022 1625 05/20/2022 2247 05/27/22 0323  WBC 14.8* 19.5* 21.3*  --  16.7*  HGB 7.2* 6.5* 6.6* 7.2* 6.5*  HCT 19.8* 19.3* 19.3* 20.9* 18.8*  PLT 382 106* 72*  --  65*    COAGS: Recent Labs    05/12/22 0015 05/12/22 0628 05/21/2022 0508  INR 1.0 1.0 1.5*    BMP: Recent Labs    05/17/22 0324 06/15/2022 0508 06/07/2022 1342 05/27/22 0323  NA 136 134* 137 133*  K 3.0* 2.5* 2.5* 3.1*  CL 103 103 106 110  CO2 20* 18* 18* 16*  GLUCOSE 128* 108* 102* 125*  BUN 18 80* 77* 80*  CALCIUM 8.0* 7.2* 7.4* 6.9*  CREATININE 1.17 1.97* 1.50* 1.64*  GFRNONAA >60 35* 49* 44*    LIVER FUNCTION TESTS: Recent Labs    05/17/22 0324 06/14/2022 0508 05/31/2022 1342 05/27/22 0323  BILITOT 16.9* 20.8* 23.0* 16.3*  AST 50* 799* 650* 375*  ALT 51* 344* 325* 228*  ALKPHOS 289* 388* 448* 293*  PROT 5.2* 5.2* 5.2* 4.4*  ALBUMIN <1.5* <1.5* 1.5* <1.5*    Assessment and Plan: 72 year old African-American male who presented with jaundice, in setting of known bladder cancer, had also had a weight loss of about 20 pounds over the past 3 weeks He was found on imaging to have severe intra and extrahepatic biliary ductal dilation. 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 Atypical cells found but no overt cancer found as of yet on studies from EUS/ERCP. Elevated CA 19-9 makes an underlying malignancy the most likely etiology for his symptoms. Now with AMS and increasing total bili/LFTs; s/p right hepatic fluid collection drainage 3/7; afebrile, WBC 16.7(21.3), hgb 6.5(7.2), plts 65k(72k), K 3.1- replace; creat 1.64(1.5), t bili 16.3(23), other LFT's sl lower; bile cx pend; cont current tx/antbx/ hydration, monitor labs closely; send bile for cytology; additional plans as outlined by  GI   Electronically Signed: D. Rowe Robert, PA-C 05/27/2022, 10:08 AM   I spent a total of 15 minutes at the the patient's bedside AND on the patient's hospital floor or unit, greater than 50% of which was counseling/coordinating care for hepatic fluid collection drainage    Patient ID: Eddie Mendoza, male   DOB: 10/27/1950, 72 y.o.   MRN: [id number]

## 2022-05-27 NOTE — Progress Notes (Signed)
PROGRESS NOTE    Eddie Mendoza  [id number] DOB: 08-03-1950 DOA: 06/11/2022 PCP: [facility]    Brief Narrative:  72 year old male with past medical history of benign prostatic hyperplasia status post TURP in 2022, hypertension, alcohol abuse and recent diagnosis of malignant biliary stricture and ERCP , biopsy and biliary stenting who presented to [facility] emergency department due worsening weakness and lethargy at the advisement of of his outpatient gastroenterologist Dr. Rush Landmark. Was called about pathology report and family stated he is lethargic so asked to come to ER.  patient was recently hospitalized from 2/21 until 2/27 during which patient underwent recent ERCP 05/13/2022 with finding of a long common bile duct stricture status post temporary plastic stent placement in the pancreatic duct and additional plastic stent into the common bile duct.  Patient was found to have a markedly elevated bilirubin 19.9 concerning for pancreatic malignancy.  Pathology was not conclusive.   Upon evaluation in the emergency department imaging revealing findings concerning for multiple hepatic abscesses.  The hospitalist group was then called to assess the patient for mission the hospital.  Admitted with interventional radiology ID consultation. Started on broad-spectrum antibiotics, underwent percutaneous drainage of the liver abscess.  Remains sick, anemia and encephalopathic.   Assessment & Plan:   Multiple liver abscesses versus biliary collections  Obstructive jaundice with malignant stricture, suspected metastatic cancer. Continue maintenance IV fluids.  Currently remains on IV Zosyn.  Blood cultures and bile cultures are pending. Status post percutaneous drainage of the liver abscess, cultures are pending.  Followed by IR. LFTs are greatly elevated, some improvement of bilirubin today.  Will continue to monitor.  Acute on chronic anemia of chronic disease: Hemoglobin  6.5 on presentation.  Patient hemoglobin 7.5. Currently on Protonix injection. Patient on third unit of PRBC transfusion today, will recheck hemoglobin after transfusion.  Acute metabolic encephalopathy: Probably due to acute infection. CT head was normal on admission. Ammonia was 32. Reportedly patient has chronic alcoholism and continues to drink. Currently remains encephalopathic, lethargic. Severe metabolic derangements, poor prognosis.  I will involve palliative care team.  Acute kidney injury: Due to 1.  Maintenance IV fluids.  Monitor levels.  Urine output is reported 800 mL over last 24 hours.    DVT prophylaxis: SCDs Start: 06/08/2022 0748   Code Status: Full code Family Communication: None at the bedside. Disposition Plan: Status is: Inpatient Remains inpatient appropriate because: Severe systemic conditions, critically ill.  Remains at the stepdown unit.     Consultants:  GI Infectious disease IR  Procedures:  Percutaneous drainage of the liver abscesses 3/7  Antimicrobials:  Zosyn 3/7---   Subjective: Patient seen and examined.  Remains lethargic.  Follows simple commands but does not interact.  Remains sleepy all the time.  He was given dose of fentanyl admitted overnight and also received a dose of Ativan at 1:00 in the morning.  No reported bowel movement since admission.  No family at the bedside.  Currently receiving blood transfusions.  Objective: Vitals:   05/27/22 0825 05/27/22 0839 05/27/22 0922 05/27/22 0938  BP: (!) 131/51  98/83 (!) 138/58  Pulse:    66  Resp: '15  18 13  '$ Temp: 98.6 F (37 C) 97.7 F (36.5 C) 97.7 F (36.5 C) 97.6 F (36.4 C)  TempSrc: Axillary Axillary Axillary Axillary  SpO2: 99%  99% 95%  Weight:      Height:        Intake/Output Summary (Last 24 hours) at 05/27/2022  [place] filed at 05/27/2022 0825 Gross per 24 hour  Intake 2718.39 ml  Output 1240 ml  Net 1478.39 ml   Filed Weights   06/18/2022 1100 05/27/22  0446  Weight: 89.2 kg 88.9 kg    Examination:  General exam: Sick looking. Patient is alert on stimulation but very lethargic.  Deeply icteric. He follows simple commands.  Without any asterixis.  Without any tremors. Respiratory system: Clear to auscultation.  Poor inspiratory effort. Cardiovascular system: S1 & S2 heard, RRR.  Gastrointestinal system: Soft.  Mildly distended.  No localized tenderness. Percutaneous drainage with mostly biliary, bloody output.   Data Reviewed: I have personally reviewed following labs and imaging studies  CBC: Recent Labs  Lab 05/20/2022 0508 05/27/2022 1625 06/16/2022 2247 05/27/22 0323  WBC 19.5* 21.3*  --  16.7*  NEUTROABS 16.3* 18.2*  --   --   HGB 6.5* 6.6* 7.2* 6.5*  HCT 19.3* 19.3* 20.9* 18.8*  MCV 87.7 86.2  --  87.9  PLT 106* 72*  --  65*   Basic Metabolic Panel: Recent Labs  Lab 06/11/2022 0508 06/06/2022 1342 05/27/22 0323  NA 134* 137 133*  K 2.5* 2.5* 3.1*  CL 103 106 110  CO2 18* 18* 16*  GLUCOSE 108* 102* 125*  BUN 80* 77* 80*  CREATININE 1.97* 1.50* 1.64*  CALCIUM 7.2* 7.4* 6.9*   GFR: Estimated Creatinine Clearance: 44 mL/min (A) (by C-G formula based on SCr of 1.64 mg/dL (H)). Liver Function Tests: Recent Labs  Lab 06/08/2022 0508 06/04/2022 1342 05/27/22 0323  AST 799* 650* 375*  ALT 344* 325* 228*  ALKPHOS 388* 448* 293*  BILITOT 20.8* 23.0* 16.3*  PROT 5.2* 5.2* 4.4*  ALBUMIN <1.5* 1.5* <1.5*   Recent Labs  Lab 06/18/2022 0508  LIPASE 60*   Recent Labs  Lab 05/23/2022 0509  AMMONIA 30   Coagulation Profile: Recent Labs  Lab 06/11/2022 0508  INR 1.5*   Cardiac Enzymes: No results for input(s): "CKTOTAL", "CKMB", "CKMBINDEX", "TROPONINI" in the last 168 hours. BNP (last 3 results) No results for input(s): "PROBNP" in the last 8760 hours. HbA1C: No results for input(s): "HGBA1C" in the last 72 hours. CBG: Recent Labs  Lab 06/13/2022 0515  GLUCAP 92   Lipid Profile: No results for input(s): "CHOL",  "HDL", "LDLCALC", "TRIG", "CHOLHDL", "LDLDIRECT" in the last 72 hours. Thyroid Function Tests: No results for input(s): "TSH", "T4TOTAL", "FREET4", "T3FREE", "THYROIDAB" in the last 72 hours. Anemia Panel: Recent Labs    05/24/2022 0800  VITAMINB12 1,498*  FOLATE 16.5  FERRITIN 1,174*  TIBC 186*  IRON 33*  RETICCTPCT 1.1   Sepsis Labs: Recent Labs  Lab 05/20/2022 0509 05/29/2022 0658  LATICACIDVEN 2.0* 1.6    Recent Results (from the past 240 hour(s))  Blood culture (routine x 2)     Status: Abnormal (Preliminary result)   Collection Time: 05/31/2022  5:20 AM   Specimen: BLOOD  Result Value Ref Range Status   Specimen Description   Final    BLOOD LEFT ANTECUBITAL Performed at [facility] [address] [place], [place] [address]    Special Requests   Final    BOTTLES DRAWN AEROBIC AND ANAEROBIC Blood Culture results may not be optimal due to an excessive volume of blood received in culture bottles Performed at [facility] [address] [place], [place] [address]    Culture  Setup Time   Final    GRAM NEGATIVE RODS ANAEROBIC BOTTLE ONLY CRITICAL RESULT CALLED  TO, READ BACK BY AND VERIFIED WITH: PHARMD A. [facility] [id number] @ [address] [place] GRAM POSITIVE COCCI IN CHAINS BOTTLES DRAWN AEROBIC ONLY CRITICAL VALUE NOTED.  VALUE IS CONSISTENT WITH PREVIOUSLY REPORTED AND CALLED VALUE. Performed at [facility], [place] [address] [place], [place] [address]    Culture (A)  Final    STREPTOCOCCUS MITIS/ORALIS SUSCEPTIBILITIES TO FOLLOW GRAM NEGATIVE RODS    Report Status PENDING  Incomplete  Blood Culture ID Panel (Reflexed)     Status: Abnormal   Collection Time: 06/03/2022  5:20 AM  Result Value Ref Range Status   Enterococcus faecalis NOT DETECTED NOT DETECTED Final   Enterococcus Faecium NOT DETECTED NOT DETECTED Final   Listeria monocytogenes NOT DETECTED NOT DETECTED Final   Staphylococcus species NOT DETECTED NOT DETECTED Final    Staphylococcus aureus (BCID) NOT DETECTED NOT DETECTED Final   Staphylococcus epidermidis NOT DETECTED NOT DETECTED Final   Staphylococcus lugdunensis NOT DETECTED NOT DETECTED Final   Streptococcus species DETECTED (A) NOT DETECTED Final    Comment: Not Enterococcus species, Streptococcus agalactiae, Streptococcus pyogenes, or Streptococcus pneumoniae. CRITICAL RESULT CALLED TO, READ BACK BY AND VERIFIED WITH: PHARMD A. Nelia Shi [id number] @ 2051 [facility]    Streptococcus agalactiae NOT DETECTED NOT DETECTED Final   Streptococcus pneumoniae NOT DETECTED NOT DETECTED Final   Streptococcus pyogenes NOT DETECTED NOT DETECTED Final   A.calcoaceticus-baumannii NOT DETECTED NOT DETECTED Final   Bacteroides fragilis DETECTED (A) NOT DETECTED Final    Comment: CRITICAL RESULT CALLED TO, READ BACK BY AND VERIFIED WITH: PHARMD A. [facility] [id number] @ [address] [place]    Enterobacterales NOT DETECTED NOT DETECTED Final   Enterobacter cloacae complex NOT DETECTED NOT DETECTED Final   Escherichia coli NOT DETECTED NOT DETECTED Final   Klebsiella aerogenes NOT DETECTED NOT DETECTED Final   Klebsiella oxytoca NOT DETECTED NOT DETECTED Final   Klebsiella pneumoniae NOT DETECTED NOT DETECTED Final   Proteus species NOT DETECTED NOT DETECTED Final   Salmonella species NOT DETECTED NOT DETECTED Final   Serratia marcescens NOT DETECTED NOT DETECTED Final   Haemophilus influenzae NOT DETECTED NOT DETECTED Final   Neisseria meningitidis NOT DETECTED NOT DETECTED Final   Pseudomonas aeruginosa NOT DETECTED NOT DETECTED Final   Stenotrophomonas maltophilia NOT DETECTED NOT DETECTED Final   Candida albicans NOT DETECTED NOT DETECTED Final   Candida auris NOT DETECTED NOT DETECTED Final   Candida glabrata NOT DETECTED NOT DETECTED Final   Candida krusei NOT DETECTED NOT DETECTED Final   Candida parapsilosis NOT DETECTED NOT DETECTED Final   Candida tropicalis NOT DETECTED NOT DETECTED Final   Cryptococcus neoformans/gattii NOT  DETECTED NOT DETECTED Final    Comment: Performed at [facility], [place] [address], [place], [place] [address]  Blood culture (routine x 2)     Status: Abnormal (Preliminary result)   Collection Time: 05/20/2022  5:37 AM   Specimen: BLOOD LEFT FOREARM  Result Value Ref Range Status   Specimen Description   Final    BLOOD LEFT FOREARM Performed at [facility], [place] [address], [place], [place] [address]    Special Requests   Final    BOTTLES DRAWN AEROBIC AND ANAEROBIC Blood Culture adequate volume Performed at [facility] [address] [place], [place] [address]    Culture  Setup Time   Final    GRAM POSITIVE COCCI IN CHAINS IN BOTH AEROBIC AND ANAEROBIC BOTTLES CRITICAL RESULT CALLED TO, READ BACK BY AND VERIFIED WITH: PHARMD A.  [facility] [id number] @ [address] [place] Performed at [facility], [place] [address], [place], [place] [address]    Culture STREPTOCOCCUS MITIS/ORALIS (A)  Final   Report Status PENDING  Incomplete  Blood Culture ID Panel (Reflexed)     Status: Abnormal   Collection Time: 06/06/2022  5:37 AM  Result Value Ref Range Status   Enterococcus faecalis NOT DETECTED NOT DETECTED Final   Enterococcus Faecium NOT DETECTED NOT DETECTED Final   Listeria monocytogenes NOT DETECTED NOT DETECTED Final   Staphylococcus species NOT DETECTED NOT DETECTED Final   Staphylococcus aureus (BCID) NOT DETECTED NOT DETECTED Final   Staphylococcus epidermidis NOT DETECTED NOT DETECTED Final   Staphylococcus lugdunensis NOT DETECTED NOT DETECTED Final   Streptococcus species DETECTED (A) NOT DETECTED Final    Comment: Not Enterococcus species, Streptococcus agalactiae, Streptococcus pyogenes, or Streptococcus pneumoniae. CRITICAL RESULT CALLED TO, READ BACK BY AND VERIFIED WITH: PHARMD A. Nelia Shi [id number] @ [address] [facility]    Streptococcus agalactiae NOT DETECTED NOT DETECTED Final   Streptococcus pneumoniae NOT DETECTED NOT DETECTED Final   Streptococcus pyogenes NOT  DETECTED NOT DETECTED Final   A.calcoaceticus-baumannii NOT DETECTED NOT DETECTED Final   Bacteroides fragilis DETECTED (A) NOT DETECTED Final    Comment: CRITICAL RESULT CALLED TO, READ BACK BY AND VERIFIED WITH: PHARMD A. [facility] [id number] @ [address] [place]    Enterobacterales NOT DETECTED NOT DETECTED Final   Enterobacter cloacae complex NOT DETECTED NOT DETECTED Final   Escherichia coli NOT DETECTED NOT DETECTED Final   Klebsiella aerogenes NOT DETECTED NOT DETECTED Final   Klebsiella oxytoca NOT DETECTED NOT DETECTED Final   Klebsiella pneumoniae NOT DETECTED NOT DETECTED Final   Proteus species NOT DETECTED NOT DETECTED Final   Salmonella species NOT DETECTED NOT DETECTED Final   Serratia marcescens NOT DETECTED NOT DETECTED Final   Haemophilus influenzae NOT DETECTED NOT DETECTED Final   Neisseria meningitidis NOT DETECTED NOT DETECTED Final   Pseudomonas aeruginosa NOT DETECTED NOT DETECTED Final   Stenotrophomonas maltophilia NOT DETECTED NOT DETECTED Final   Candida albicans NOT DETECTED NOT DETECTED Final   Candida auris NOT DETECTED NOT DETECTED Final   Candida glabrata NOT DETECTED NOT DETECTED Final   Candida krusei NOT DETECTED NOT DETECTED Final   Candida parapsilosis NOT DETECTED NOT DETECTED Final   Candida tropicalis NOT DETECTED NOT DETECTED Final   Cryptococcus neoformans/gattii NOT DETECTED NOT DETECTED Final    Comment: Performed at [facility], [place] [address], [place], [place] [address]  MRSA Next Gen by PCR, Nasal     Status: None   Collection Time: 06/01/2022  9:37 AM   Specimen: Nasal Mucosa; Nasal Swab  Result Value Ref Range Status   MRSA by PCR Next Gen NOT DETECTED NOT DETECTED Final    Comment: (NOTE) The GeneXpert MRSA Assay (FDA approved for NASAL specimens only), is one component of a comprehensive MRSA colonization surveillance program. It is not intended to diagnose MRSA infection nor to guide or monitor treatment for MRSA infections. Test  performance is not FDA approved in patients less than 61 years old. Performed at [facility], [place] [address] [place], [place] [address]   Aerobic/Anaerobic Culture w Gram Stain (surgical/deep wound)     Status: None (Preliminary result)   Collection Time: 06/16/2022  1:00 PM   Specimen: Liver; Abscess  Result Value Ref Range Status   Specimen Description   Final    LIVER Performed at [facility]  [address] [place], [place] [address]    Special Requests   Final    NONE Performed at [facility], [place] [address] [place], [place] [address]    Gram Stain   Final    NO WBC SEEN ABUNDANT GRAM NEGATIVE RODS ABUNDANT GRAM POSITIVE COCCI IN CHAINS Performed at [facility], [place] [address] [place], [place] [address]    Culture PENDING  Incomplete   Report Status PENDING  Incomplete         Radiology Studies: IR US Guide Bx Asp/Drain  Result Date: 05/24/2022 INDICATION: HEPATIC ABSCESS, OBSTRUCTIVE JAUNDICE EXAM: ULTRASOUND RIGHT HEPATIC ABSCESS DRAIN PLACEMENT MEDICATIONS: The patient is currently admitted to the hospital and receiving intravenous antibiotics. The antibiotics were administered within an appropriate time frame prior to the initiation of the procedure. ANESTHESIA/SEDATION: Moderate (conscious) sedation was employed during this procedure. A total of Versed 0.5 mg and Fentanyl 50 mcg was administered intravenously by the radiology nurse. Total intra-service moderate Sedation Time: 10 minutes. The patient's level of consciousness and vital signs were monitored continuously by radiology nursing throughout the procedure under my direct supervision. COMPLICATIONS: None immediate. PROCEDURE: Informed written consent was obtained from the patient after a thorough discussion of the procedural risks, benefits and alternatives. All questions were addressed. Maximal Sterile Barrier Technique was utilized including caps,  mask, sterile gowns, sterile gloves, sterile drape, hand hygiene and skin antiseptic. A timeout was performed prior to the initiation of the procedure. previous imaging reviewed. preliminary ultrasound performed. the posterior right hepatic abscess was localized and marked through a lower intercostal space in the mid axillary line. Under sterile conditions and local anesthesia, percutaneous needle access performed of the inferior right hepatic abscess with an 18 gauge 15 cm needle. Needle position confirmed with ultrasound. Images obtained for documentation. There was return of exudative bile compatible with abscess or infected biloma. Guidewire inserted. Guidewire position confirmed with ultrasound. Tract dilatation performed to insert a 10 [place] drain. Drain catheter position confirmed with ultrasound. Syringe aspiration yielded 115 cc exudative bilious fluid. Catheter secured with a silk suture connected to external suction bulb. Sterile dressing applied. No immediate complication. Patient tolerated the procedure well. IMPRESSION: Successful ultrasound-guided right hepatic abscess drainage catheter placement. Electronically Signed   By: Jerilynn Mages.  Shick M.D.   On: 06/11/2022 13:57   CT ABDOMEN PELVIS WO CONTRAST  Result Date: 06/12/2022 CLINICAL DATA:  Abdominal pain, weakness and jaundice. EXAM: CT ABDOMEN AND PELVIS WITHOUT CONTRAST TECHNIQUE: Multidetector CT imaging of the abdomen and pelvis was performed following the standard protocol without IV contrast. RADIATION DOSE REDUCTION: This exam was performed according to the departmental dose-optimization program which includes automated exposure control, adjustment of the mA and/or kV according to patient size and/or use of iterative reconstruction technique. COMPARISON:  CT with IV contrast 05/16/2022, 05/11/2022, and 03/21/2022. FINDINGS: Lower chest: Development of bilateral trace pleural effusions. Mild increased posterior atelectasis. No acute process.  Bilateral gynecomastia. The cardiac size is normal. The cardiac blood pool is less dense than the myocardium consistent with anemia. Hepatobiliary: Again noted are common bile duct stent and a small caliber stent in the proximal pancreatic duct both terminating in the duodenal. The gallbladder is fluid-filled but not dilated, unchanged with again noted thickened appearance to the gallbladder neck which is ill-defined and may infiltrate the adjacent liver. Severe intrahepatic biliary dilatation continues to be seen. There is interval development of a large heterogeneous fluid collection in the right lobe of the liver in segment 7 measuring  7.8 x 6.9 by 8.7 cm, concerning for hepatic abscess since it is unlikely a metastasis this large would have developed in 10 days. There is a small new fluid collection in segment 4B on 3:25 measuring 2.2 cm which could also be an abscess. There is also a new nonspecific hypodensity in segment 5 on 3:34 measuring 1.7 cm, and another in the more anterior aspect of segment 5 measuring 2.2 cm on 3:33. I suspect these are probably also fluid collections although it is possible they could be metastases. No other new abnormality in the liver is seen. Pancreas: Changes of proximal to mid peripancreatic edema are somewhat increased today but there is generalized increased mesenteric edema elsewhere as well. 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 A 3 cm hypodense lesion in the pancreatic head appears similar. No downstream ductal dilatation is visible. Small caliber stent in the proximal pancreatic duct is similar in positioning. Spleen: Increased splenomegaly, 15 cm coronal, previously 14.1 cm coronal. No focal abnormality without contrast. Adrenals/Urinary Tract: No adrenal or renal masses seen without contrast. No urinary stone or hydronephrosis. Large heterogeneous mass in the bladder continues to be seen. Stomach/Bowel: The stomach and small-bowel show no obvious  abnormality. The appendix is normal caliber. There is wall thickening in the ascending, transverse and descending colon concerning for colitis, scattered inflammatory changes over portions suspected. Vascular/Lymphatic: Stable retroperitoneal and mesenteric root adenopathy. No new adenopathy. Aortic atherosclerosis. Reproductive: Severe prostatomegaly. Other: Mild but interval increased ascites in the abdomen and pelvis. Increased generalized mesenteric edema and increased body wall anasarca. No free air. Musculoskeletal: No new or acute osseous findings. No destructive lesions. IMPRESSION: 1. Interval development of a large heterogeneous fluid collection in the right lobe of the liver in segment 7 measuring 7.8 x 6.9 x 8.7 cm, concerning for hepatic abscess since it is unlikely a metastasis this large would have developed in 10 days. 2. There is a new 2.2 cm fluid collection in segment 4B on 3:25 which could also be an abscess. 3. There are 2 additional nonspecific hypodensities in the right lobe of the liver measuring 1.7 cm and 2.2 cm. I suspect these are also fluid collections although it is possible they could be metastases. MRI without and with contrast may be helpful. 4. Persistent severe intrahepatic biliary dilatation with common bile duct stent and small caliber stent in the proximal pancreatic duct. 5. Increased peripancreatic edema and generalized mesenteric edema, increased body wall anasarca and mild interval increased abdominopelvic ascites. 6. Stable 3 cm hypodense lesion in the pancreatic head. 7. Stable retroperitoneal and mesenteric root adenopathy. 8. New trace pleural effusions. 9. Increased splenomegaly. 10. Large heterogeneous mass in the bladder continues to be seen. 11. Diffuse colonic wall thickening concerning for colitis, scattered inflammatory changes over portions suspected. Otherwise possible this could be congestive or due to hepatic dysfunction. 12. Severe prostatomegaly. 13. Aortic  atherosclerosis. Aortic Atherosclerosis (ICD10-I70.0). Electronically Signed   By: Telford Nab M.D.   On: 05/24/2022 07:21   CT Head Wo Contrast  Result Date: 06/03/2022 CLINICAL DATA:  72 year old male with altered mental status. Increased weakness, jaundice, abdominal pain. EXAM: CT HEAD WITHOUT CONTRAST TECHNIQUE: Contiguous axial images were obtained from the base of the skull through the vertex without intravenous contrast. RADIATION DOSE REDUCTION: This exam was performed according to the departmental dose-optimization program which includes automated exposure control, adjustment of the mA and/or kV according to patient size and/or use of iterative reconstruction technique. COMPARISON:  None Available. FINDINGS: Brain: Cerebral volume  is within normal limits for age. No midline shift, ventriculomegaly, mass effect, evidence of mass lesion, intracranial hemorrhage or evidence of cortically based acute infarction. Gray-white matter differentiation is within normal limits throughout the brain. No encephalomalacia identified. Vascular: Mild Calcified atherosclerosis at the skull base. No suspicious intracranial vascular hyperdensity. Skull: No acute osseous abnormality identified. Sinuses/Orbits: Pronounced maxillary sinus mucoperiosteal thickening with superimposed fluid level and bubbly opacity. Other paranasal sinuses are fairly well aerated, only mild mucosal thickening elsewhere. Tympanic cavities and mastoids are clear. Other: Visualized orbits and scalp soft tissues are within normal limits. IMPRESSION: 1. Normal for age non contrast CT appearance of the brain. 2. 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 Electronically Signed   By: Genevie Ann M.D.   On: 06/16/2022 06:43   DG Chest Portable 1 View  Result Date: 06/19/2022 CLINICAL DATA:  Altered mental status, jaundice and weakness. EXAM: PORTABLE CHEST 1 VIEW COMPARISON:  PA chest 07/22/2010 FINDINGS: The lungs are expiratory with limited view of the  bases. The visualized lungs are clear. The sulci are sharp. Heart size and vasculature are normal for the low lung volumes. The mediastinum is normally outlined. There is thoracic spondylosis. IMPRESSION: Expiratory exam with limited view of the bases. No evidence of acute cardiopulmonary disease within study limitations. Electronically Signed   By: Telford Nab M.D.   On: 05/30/2022 06:27        Scheduled Meds:  Chlorhexidine Gluconate Cloth  6 each Topical Daily   famotidine  40 mg Oral Daily   finasteride  5 mg Oral Daily   folic acid  1 mg Oral Daily   lactulose  300 mL Rectal Once   multivitamin with minerals  1 tablet Oral Daily   pantoprazole (PROTONIX) IV  40 mg Intravenous Q12H   potassium chloride  40 mEq Oral Once   sodium bicarbonate  650 mg Oral BID   sodium chloride flush  3 mL Intravenous Q12H   sodium chloride flush  5 mL Intracatheter Q8H   thiamine  100 mg Oral Daily   Or   thiamine  100 mg Intravenous Daily   Continuous Infusions:  piperacillin-tazobactam (ZOSYN)  IV 12.5 mL/hr at 05/27/22 0600     LOS: 1 day    Time spent: 50 minutes    Barb Merino, MD [facility] Pager [phone]

## 2022-05-27 NOTE — Progress Notes (Addendum)
[facility] Progress Note  CC:  Jaundice, liver lesions   Subjective:  Patient even more confused today.  Mumbling.  No family at bedside.  Drain on the right with brown fluid.  No BMs or sign of bleeding per his nurse.  Objective:  Vital signs in last 24 hours: Temp:  [97.7 F (36.5 C)-98.6 F (37 C)] 97.7 F (36.5 C) (03/08 0839) Pulse Rate:  [69-87] 69 (03/07 2200) Resp:  [13-25] 15 (03/08 0825) BP: (65-144)/(22-69) 131/51 (03/08 0825) SpO2:  [91 %-100 %] 99 % (03/08 0825) Weight:  [88.9 kg-89.2 kg] 88.9 kg (03/08 0446) Last BM Date : 05/22/22 General:  Alert, chronically ill-appearing, confused/mumbling. Heart:  Regular rate and rhythm; no murmurs Pulm:  CTAB.  No W/R/R. Abdomen:  Soft, somewhat distended.  BS present.  Drain noted on the right with brown fluid. Extremities:  Pitting edema noted in B/L LEs.  Intake/Output from previous day: 03/07 0701 - 03/08 0700 In: 3453.4 [I.V.:1026; Blood:980; IV Piggyback:1398.4] Out: 1240 [Urine:800; Drains:440] Intake/Output this shift: Total I/O In: 580 [Blood:580] Out: -   Lab Results: Recent Labs    06/08/2022 0508 05/21/2022 1625 06/10/2022 2247 05/27/22 0323  WBC 19.5* 21.3*  --  16.7*  HGB 6.5* 6.6* 7.2* 6.5*  HCT 19.3* 19.3* 20.9* 18.8*  PLT 106* 72*  --  65*   BMET Recent Labs    06/11/2022 0508 06/09/2022 1342 05/27/22 0323  NA 134* 137 133*  K 2.5* 2.5* 3.1*  CL 103 106 110  CO2 18* 18* 16*  GLUCOSE 108* 102* 125*  BUN 80* 77* 80*  CREATININE 1.97* 1.50* 1.64*  CALCIUM 7.2* 7.4* 6.9*   LFT Recent Labs    06/16/2022 1342 05/27/22 0323  PROT 5.2* 4.4*  ALBUMIN 1.5* <1.5*  AST 650* 375*  ALT 325* 228*  ALKPHOS 448* 293*  BILITOT 23.0* 16.3*  BILIDIR 15.0*  --    PT/INR Recent Labs    06/06/2022 0508  LABPROT 18.1*  INR 1.5*   IR US Guide Bx Asp/Drain  Result Date: 06/08/2022 INDICATION: HEPATIC ABSCESS, OBSTRUCTIVE JAUNDICE EXAM: ULTRASOUND RIGHT HEPATIC ABSCESS DRAIN PLACEMENT  MEDICATIONS: The patient is currently admitted to the hospital and receiving intravenous antibiotics. The antibiotics were administered within an appropriate time frame prior to the initiation of the procedure. ANESTHESIA/SEDATION: Moderate (conscious) sedation was employed during this procedure. A total of Versed 0.5 mg and Fentanyl 50 mcg was administered intravenously by the radiology nurse. Total intra-service moderate Sedation Time: 10 minutes. The patient's level of consciousness and vital signs were monitored continuously by radiology nursing throughout the procedure under my direct supervision. COMPLICATIONS: None immediate. PROCEDURE: Informed written consent was obtained from the patient after a thorough discussion of the procedural risks, benefits and alternatives. All questions were addressed. Maximal Sterile Barrier Technique was utilized including caps, mask, sterile gowns, sterile gloves, sterile drape, hand hygiene and skin antiseptic. A timeout was performed prior to the initiation of the procedure. previous imaging reviewed. preliminary ultrasound performed. the posterior right hepatic abscess was localized and marked through a lower intercostal space in the mid axillary line. Under sterile conditions and local anesthesia, percutaneous needle access performed of the inferior right hepatic abscess with an 18 gauge 15 cm needle. Needle position confirmed with ultrasound. Images obtained for documentation. There was return of exudative bile compatible with abscess or infected biloma. Guidewire inserted. Guidewire position confirmed with ultrasound. Tract dilatation performed to insert a 10 [place] drain. Drain catheter position confirmed with ultrasound.  Syringe aspiration yielded 115 cc exudative bilious fluid. Catheter secured with a silk suture connected to external suction bulb. Sterile dressing applied. No immediate complication. Patient tolerated the procedure well. IMPRESSION: Successful  ultrasound-guided right hepatic abscess drainage catheter placement. Electronically Signed   By: Jerilynn Mages.  Shick M.D.   On: 06/15/2022 13:57   CT ABDOMEN PELVIS WO CONTRAST  Result Date: 06/03/2022 CLINICAL DATA:  Abdominal pain, weakness and jaundice. EXAM: CT ABDOMEN AND PELVIS WITHOUT CONTRAST TECHNIQUE: Multidetector CT imaging of the abdomen and pelvis was performed following the standard protocol without IV contrast. RADIATION DOSE REDUCTION: This exam was performed according to the departmental dose-optimization program which includes automated exposure control, adjustment of the mA and/or kV according to patient size and/or use of iterative reconstruction technique. COMPARISON:  CT with IV contrast 05/16/2022, 05/11/2022, and 03/21/2022. FINDINGS: Lower chest: Development of bilateral trace pleural effusions. Mild increased posterior atelectasis. No acute process. Bilateral gynecomastia. The cardiac size is normal. The cardiac blood pool is less dense than the myocardium consistent with anemia. Hepatobiliary: Again noted are common bile duct stent and a small caliber stent in the proximal pancreatic duct both terminating in the duodenal. The gallbladder is fluid-filled but not dilated, unchanged with again noted thickened appearance to the gallbladder neck which is ill-defined and may infiltrate the adjacent liver. Severe intrahepatic biliary dilatation continues to be seen. There is interval development of a large heterogeneous fluid collection in the right lobe of the liver in segment 7 measuring 7.8 x 6.9 by 8.7 cm, concerning for hepatic abscess since it is unlikely a metastasis this large would have developed in 10 days. There is a small new fluid collection in segment 4B on 3:25 measuring 2.2 cm which could also be an abscess. There is also a new nonspecific hypodensity in segment 5 on 3:34 measuring 1.7 cm, and another in the more anterior aspect of segment 5 measuring 2.2 cm on 3:33. I suspect these are  probably also fluid collections although it is possible they could be metastases. No other new abnormality in the liver is seen. Pancreas: Changes of proximal to mid peripancreatic edema are somewhat increased today but there is generalized increased mesenteric edema elsewhere as well. 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 A 3 cm hypodense lesion in the pancreatic head appears similar. No downstream ductal dilatation is visible. Small caliber stent in the proximal pancreatic duct is similar in positioning. Spleen: Increased splenomegaly, 15 cm coronal, previously 14.1 cm coronal. No focal abnormality without contrast. Adrenals/Urinary Tract: No adrenal or renal masses seen without contrast. No urinary stone or hydronephrosis. Large heterogeneous mass in the bladder continues to be seen. Stomach/Bowel: The stomach and small-bowel show no obvious abnormality. The appendix is normal caliber. There is wall thickening in the ascending, transverse and descending colon concerning for colitis, scattered inflammatory changes over portions suspected. Vascular/Lymphatic: Stable retroperitoneal and mesenteric root adenopathy. No new adenopathy. Aortic atherosclerosis. Reproductive: Severe prostatomegaly. Other: Mild but interval increased ascites in the abdomen and pelvis. Increased generalized mesenteric edema and increased body wall anasarca. No free air. Musculoskeletal: No new or acute osseous findings. No destructive lesions. IMPRESSION: 1. Interval development of a large heterogeneous fluid collection in the right lobe of the liver in segment 7 measuring 7.8 x 6.9 x 8.7 cm, concerning for hepatic abscess since it is unlikely a metastasis this large would have developed in 10 days. 2. There is a new 2.2 cm fluid collection in segment 4B on 3:25 which could also be an  abscess. 3. There are 2 additional nonspecific hypodensities in the right lobe of the liver measuring 1.7 cm and 2.2 cm. I suspect these are also  fluid collections although it is possible they could be metastases. MRI without and with contrast may be helpful. 4. Persistent severe intrahepatic biliary dilatation with common bile duct stent and small caliber stent in the proximal pancreatic duct. 5. Increased peripancreatic edema and generalized mesenteric edema, increased body wall anasarca and mild interval increased abdominopelvic ascites. 6. Stable 3 cm hypodense lesion in the pancreatic head. 7. Stable retroperitoneal and mesenteric root adenopathy. 8. New trace pleural effusions. 9. Increased splenomegaly. 10. Large heterogeneous mass in the bladder continues to be seen. 11. Diffuse colonic wall thickening concerning for colitis, scattered inflammatory changes over portions suspected. Otherwise possible this could be congestive or due to hepatic dysfunction. 12. Severe prostatomegaly. 13. Aortic atherosclerosis. Aortic Atherosclerosis (ICD10-I70.0). Electronically Signed   By: Telford Nab M.D.   On: 06/14/2022 07:21   CT Head Wo Contrast  Result Date: 05/21/2022 CLINICAL DATA:  72 year old male with altered mental status. Increased weakness, jaundice, abdominal pain. EXAM: CT HEAD WITHOUT CONTRAST TECHNIQUE: Contiguous axial images were obtained from the base of the skull through the vertex without intravenous contrast. RADIATION DOSE REDUCTION: This exam was performed according to the departmental dose-optimization program which includes automated exposure control, adjustment of the mA and/or kV according to patient size and/or use of iterative reconstruction technique. COMPARISON:  None Available. FINDINGS: Brain: Cerebral volume is within normal limits for age. No midline shift, ventriculomegaly, mass effect, evidence of mass lesion, intracranial hemorrhage or evidence of cortically based acute infarction. Gray-white matter differentiation is within normal limits throughout the brain. No encephalomalacia identified. Vascular: Mild Calcified  atherosclerosis at the skull base. No suspicious intracranial vascular hyperdensity. Skull: No acute osseous abnormality identified. Sinuses/Orbits: Pronounced maxillary sinus mucoperiosteal thickening with superimposed fluid level and bubbly opacity. Other paranasal sinuses are fairly well aerated, only mild mucosal thickening elsewhere. Tympanic cavities and mastoids are clear. Other: Visualized orbits and scalp soft tissues are within normal limits. IMPRESSION: 1. Normal for age non contrast CT appearance of the brain. 2. 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 Electronically Signed   By: Genevie Ann M.D.   On: 06/13/2022 06:43   DG Chest Portable 1 View  Result Date: 05/25/2022 CLINICAL DATA:  Altered mental status, jaundice and weakness. EXAM: PORTABLE CHEST 1 VIEW COMPARISON:  PA chest 07/22/2010 FINDINGS: The lungs are expiratory with limited view of the bases. The visualized lungs are clear. The sulci are sharp. Heart size and vasculature are normal for the low lung volumes. The mediastinum is normally outlined. There is thoracic spondylosis. IMPRESSION: Expiratory exam with limited view of the bases. No evidence of acute cardiopulmonary disease within study limitations. Electronically Signed   By: Telford Nab M.D.   On: 06/01/2022 06:27    Assessment / Plan: #65 72 year old African-American male who presented with jaundice, in setting of known bladder cancer, had also had a weight loss of about 20 pounds over the past 3 weeks.   He was found on imaging to have severe intra and extrahepatic biliary ductal dilation. 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   Atypical cells found but no overt cancer found as of yet on studies from EUS/ERCP. Elevated CA 19-9 makes an underlying  malignancy the most likely etiology for  his symptoms (pancreatic versus primary biliary).    Now with AMS and increasing total bili/LFTs.  CT scan showing findings of masses in the liver that are new and suspicious for liver abscesses.  S/p drainage with catheter placement by IR on 3/7.  LFTs actually down-trending again this AM with total bili dropping from 23-16.3, which is actually the lowest that it has been.  Other LFTs decreasing as well.   #2 Hypokalemia:  K+ improved at 3.3 this AM #3 Severe hypoalbuminema with albumin < 1.5 #4 AKI with Cr improved at 1.64 this AM #5 Leukocytosis improved at 16.7 K this AM, likely related to #1.  He is afebrile. #6 Anemia somewhat chronic, Hgb 6.5 grams compared to 7.2 grams nine days ago.  Patient reported black stools at home.  Received a unit of PRBCs with increase to 7.2 grams but back down to 6.5 grams again this AM so going to receive another unit.  No BM or sign of bleeding thus far from the patient's nurse.  Iron studies, B12, and folate ok.   #7 Colonic wall thickening on CT scan likely due to hypoalbuminemia  -IR and ID have both been consulted.  ID placed a catheter for drainage of right liver abscess on 3/7. -Agree with abx, on Zosyn currently. -Supportive care. -Trend labs.  Transfuse further prn. -At some point in the near future (timing TBD), we will plan ERCP with stent exchange. If ineffective or unsuccessful, then would move toward IR biliary drainage.     LOS: 1 day pain is draining  Janett Billow D. Zehr  05/27/2022, 9:07 AM  GI ATTENDING  Interval history data reviewed.  Patient seen and examined.  Agree with interval progress note as outlined above.  As noted, bit confused.  Afebrile.  White blood cell count stable.  Bilirubin improved.  Percutaneous drain with significant drainage ongoing.  Continue with antibiotics and supportive measures.  Need to continue to follow closely.  Timing of ERCP with stent assessment/change to be determined.  Will continue to follow  closely.  Docia Chuck. Geri Seminole., M.D. [facility] of Gastroenterology

## 2022-05-27 NOTE — Progress Notes (Signed)
ID Brief Note   Remains afebrile WBC increased to 19 Mild improvement in liver enzymes but T bili still high at 17.9  Blood cx 3/7 one set with strep mitis/oralis and GNR, another set with strep mitis/oralis  Liver aspirate cx 3/7 cx incubating   GI and IR following, plan for ERCP with stent assessment/exchange to be determined  Continue zosyn as is pending maturation of cultures  Repeat blood cx ordered for 3/9 for blood cx clearance TTE ordered  Monitor CBC and CMP  Dr Candiss Norse covering this weekend and will monitor cx, please call with active questions,  Rosiland Oz, MD Infectious Disease [facility] [address] [place], [place] [address] Phone: [phone]  Fax: [phone]

## 2022-05-27 NOTE — Plan of Care (Signed)

## 2022-05-28 ENCOUNTER — Inpatient Hospital Stay (HOSPITAL_COMMUNITY): Payer: No Typology Code available for payment source

## 2022-05-28 DIAGNOSIS — K560 Paralytic ileus: Secondary | ICD-10-CM

## 2022-05-28 DIAGNOSIS — R7881 Bacteremia: Secondary | ICD-10-CM | POA: Diagnosis not present

## 2022-05-28 DIAGNOSIS — G9341 Metabolic encephalopathy: Secondary | ICD-10-CM | POA: Diagnosis not present

## 2022-05-28 DIAGNOSIS — R935 Abnormal findings on diagnostic imaging of other abdominal regions, including retroperitoneum: Secondary | ICD-10-CM | POA: Diagnosis not present

## 2022-05-28 DIAGNOSIS — K750 Abscess of liver: Secondary | ICD-10-CM | POA: Diagnosis not present

## 2022-05-28 DIAGNOSIS — N179 Acute kidney failure, unspecified: Secondary | ICD-10-CM | POA: Diagnosis not present

## 2022-05-28 DIAGNOSIS — Z7189 Other specified counseling: Secondary | ICD-10-CM

## 2022-05-28 DIAGNOSIS — K831 Obstruction of bile duct: Secondary | ICD-10-CM | POA: Diagnosis not present

## 2022-05-28 DIAGNOSIS — R17 Unspecified jaundice: Secondary | ICD-10-CM | POA: Diagnosis not present

## 2022-05-28 DIAGNOSIS — Z515 Encounter for palliative care: Secondary | ICD-10-CM

## 2022-05-28 LAB — ECHOCARDIOGRAM COMPLETE
Area-P 1/2: 4.21 cm2
Height: 72 in
S' Lateral: 3.8 cm
Weight: 3135.82 oz

## 2022-05-28 LAB — CBC WITH DIFFERENTIAL/PLATELET
Abs Immature Granulocytes: 0.41 10*3/uL — ABNORMAL HIGH (ref 0.00–0.07)
Basophils Absolute: 0.1 10*3/uL (ref 0.0–0.1)
Basophils Relative: 0 %
Eosinophils Absolute: 0.1 10*3/uL (ref 0.0–0.5)
Eosinophils Relative: 1 %
HCT: 31.3 % — ABNORMAL LOW (ref 39.0–52.0)
Hemoglobin: 10.5 g/dL — ABNORMAL LOW (ref 13.0–17.0)
Immature Granulocytes: 2 %
Lymphocytes Relative: 2 %
Lymphs Abs: 0.3 10*3/uL — ABNORMAL LOW (ref 0.7–4.0)
MCH: 29.6 pg (ref 26.0–34.0)
MCHC: 33.5 g/dL (ref 30.0–36.0)
MCV: 88.2 fL (ref 80.0–100.0)
Monocytes Absolute: 2 10*3/uL — ABNORMAL HIGH (ref 0.1–1.0)
Monocytes Relative: 10 %
Neutro Abs: 17.2 10*3/uL — ABNORMAL HIGH (ref 1.7–7.7)
Neutrophils Relative %: 85 %
Platelets: 69 10*3/uL — ABNORMAL LOW (ref 150–400)
RBC: 3.55 MIL/uL — ABNORMAL LOW (ref 4.22–5.81)
RDW: 18.4 % — ABNORMAL HIGH (ref 11.5–15.5)
WBC: 20.1 10*3/uL — ABNORMAL HIGH (ref 4.0–10.5)
nRBC: 0 % (ref 0.0–0.2)

## 2022-05-28 LAB — COMPREHENSIVE METABOLIC PANEL
ALT: 155 U/L — ABNORMAL HIGH (ref 0–44)
AST: 126 U/L — ABNORMAL HIGH (ref 15–41)
Albumin: <1.5 g/dL — ABNORMAL LOW (ref 3.5–5.0)
Alkaline Phosphatase: 257 U/L — ABNORMAL HIGH (ref 38–126)
Anion gap: 12 (ref 5–15)
BUN: 67 mg/dL — ABNORMAL HIGH (ref 8–23)
CO2: 15 mmol/L — ABNORMAL LOW (ref 22–32)
Calcium: 7.3 mg/dL — ABNORMAL LOW (ref 8.9–10.3)
Chloride: 110 mmol/L (ref 98–111)
Creatinine, Ser: 1.04 mg/dL (ref 0.61–1.24)
GFR, Estimated: >60 mL/min (ref >60)
Glucose, Bld: 104 mg/dL — ABNORMAL HIGH (ref 70–99)
Potassium: 3.6 mmol/L (ref 3.5–5.1)
Sodium: 137 mmol/L (ref 135–145)
Total Bilirubin: 16.5 mg/dL — ABNORMAL HIGH (ref 0.3–1.2)
Total Protein: 4.8 g/dL — ABNORMAL LOW (ref 6.5–8.1)

## 2022-05-28 LAB — CULTURE, BLOOD (ROUTINE X 2): Special Requests: ADEQUATE

## 2022-05-28 LAB — BILIRUBIN, DIRECT: Bilirubin, Direct: 8.7 mg/dL — ABNORMAL HIGH (ref 0.0–0.2)

## 2022-05-28 MED ORDER — FENTANYL CITRATE PF 50 MCG/ML IJ SOSY: 25.0000 ug | PREFILLED_SYRINGE | INTRAMUSCULAR | Status: DC | PRN

## 2022-05-28 MED ORDER — METOCLOPRAMIDE HCL 5 MG/ML IJ SOLN
10.0000 mg | Freq: Four times a day (QID) | INTRAMUSCULAR | Status: DC
  Administered 2022-05-28 – 2022-05-29 (×4): 10 mg via INTRAVENOUS
  Filled 2022-05-28 (×3): qty 2

## 2022-05-28 MED ORDER — FENTANYL CITRATE PF 50 MCG/ML IJ SOSY
25.0000 ug | PREFILLED_SYRINGE | INTRAMUSCULAR | Status: DC | PRN
  Administered 2022-05-28 – 2022-05-29 (×4): 25 ug via INTRAVENOUS
  Filled 2022-05-28 (×5): qty 1

## 2022-05-28 MED ORDER — FENTANYL CITRATE PF 50 MCG/ML IJ SOSY: 12.5000 ug | PREFILLED_SYRINGE | INTRAMUSCULAR | Status: DC | PRN

## 2022-05-28 MED ORDER — LORAZEPAM 1 MG PO TABS: 1.0000 mg | ORAL_TABLET | ORAL | Status: AC | PRN

## 2022-05-28 MED ORDER — LORAZEPAM 2 MG/ML IJ SOLN
1.0000 mg | INTRAMUSCULAR | Status: AC | PRN
  Administered 2022-05-28 – 2022-05-29 (×7): 2 mg via INTRAVENOUS
  Filled 2022-05-28 (×7): qty 1

## 2022-05-28 NOTE — Progress Notes (Signed)
HISTORY OF PRESENT ILLNESS:  Eddie Mendoza is a 72 y.o. 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  Now admitted with jaundice and hepatic abscesses.  Large abscess drained percutaneously.  On antibiotics.  He did undergo plain films of the abdomen for worsening abdominal distention.  No vomiting.  He has been afebrile.  Patient remains somewhat somnolent.  Continues with significant bilious drainage.  100 cc overnight.  30 cc in drain currently.  LABS: K+ 3.6 (was 2.6 yesterday) BUN 67, creatinine 1.04 Hemoglobin 10.5 (9.6 yesterday) Blood cell count 20.1 (19.3 yesterday) Liver tests 126/155/257/16 0.5 (all improved)  X-ray: KUB shows moderate colonic distention consistent with ileus   REVIEW OF SYSTEMS:  All non-GI ROS negative Past Medical History:  Diagnosis Date   Abnormal CT scan, gallbladder 08/13/2020   Hypertension     Past Surgical History:  Procedure Laterality Date   BILIARY BRUSHING  05/13/2022   Procedure: BILIARY BRUSHING;  Surgeon: Irving Copas., MD;  Location: [place];  Service: Gastroenterology;;   BILIARY STENT PLACEMENT  05/13/2022   Procedure: BILIARY STENT PLACEMENT;  Surgeon: Irving Copas., MD;  Location: [place];  Service: Gastroenterology;;   BIOPSY  05/13/2022   Procedure: BIOPSY;  Surgeon: Irving Copas., MD;  Location: [facility];  Service: Gastroenterology;;   ERCP N/A 05/13/2022   Procedure: ENDOSCOPIC RETROGRADE CHOLANGIOPANCREATOGRAPHY (ERCP);  Surgeon: Irving Copas., MD;  Location: [place];  Service: Gastroenterology;  Laterality: N/A;   ESOPHAGOGASTRODUODENOSCOPY N/A 05/13/2022   Procedure: ESOPHAGOGASTRODUODENOSCOPY (EGD);  Surgeon: Irving Copas., MD;  Location: [place];  Service: Gastroenterology;  Laterality: N/A;   EUS N/A 05/13/2022   Procedure: FULL UPPER ENDOSCOPIC ULTRASOUND (EUS) RADIAL;  Surgeon: Rush Landmark Telford Nab., MD;   Location: [place];  Service: Gastroenterology;  Laterality: N/A;   FINE NEEDLE ASPIRATION  05/13/2022   Procedure: FINE NEEDLE ASPIRATION (FNA) LINEAR;  Surgeon: Irving Copas., MD;  Location: [place];  Service: Gastroenterology;;   IR US GUIDE BX ASP/DRAIN  05/21/2022   PANCREATIC STENT PLACEMENT  05/13/2022   Procedure: PANCREATIC STENT PLACEMENT;  Surgeon: Irving Copas., MD;  Location: [place];  Service: Gastroenterology;;   REMOVAL OF STONES  05/13/2022   Procedure: REMOVAL OF STONES;  Surgeon: Irving Copas., MD;  Location: [place];  Service: Gastroenterology;;   [name]  05/13/2022   Procedure: [name];  Surgeon: Irving Copas., MD;  Location: [place];  Service: Gastroenterology;;   TRANSURETHRAL RESECTION OF BLADDER TUMOR N/A 06/30/2020   Procedure:  [place] Free OF BLEEDING, CLOT EVACUATION;  Surgeon: Ardis Hughs, MD;  Location: [facility];  Service: Urology;  Laterality: N/A;   TRANSURETHRAL RESECTION OF BLADDER TUMOR N/A 08/13/2020   Procedure: CYSTOSCOPY, CLOT EVACUATION, FULGERATION;  Surgeon: Ardis Hughs, MD;  Location: [facility];  Service: Urology;  Laterality: N/A;    Social History Antawan R Kuzniar  reports that he has been smoking cigarettes. He has a 15.00 pack-year smoking history. He has never used smokeless tobacco. He reports current alcohol use of about 3.0 standard drinks of alcohol per week. He reports that he does not use drugs.  family history is not on file.  No Known Allergies     PHYSICAL EXAMINATION: Vital signs: BP 125/65   Pulse 69   Temp 97.9 F (36.6 C) (Oral)   Resp (!) 24   Ht 6' (1.829 m)   Wt 88.9 kg   SpO2 97%   BMI 26.58 kg/m  Constitutional: Ill-appearing not particularly responsive (received Ativan), no acute distress Psychiatric: Somnolent Eyes: Icteric Mouth: Dry mucosa Cardiovascular: heart regular rate and rhythm Lungs: clear to auscultation bilaterally  with decreased breath sounds at bases Abdomen: soft, distended with some discomfort to palpation, no obvious ascites, no peritoneal signs, normal bowel sounds, no organomegaly.  Biliary drain in place with bile Extremities: no lower extremity edema bilaterally Skin: no relevant lesions on visible extremities Neuro: No focal deficits.   ASSESSMENT:  1.  Hepatic abscesses status post PTC drain.  Appears to be draining bile with improving liver test.  Suspect this is providing a degree of biliary decompression.  On IV antibiotics (Zosyn).  Elevated white blood cell count but remains afebrile with stable vital signs. 2.  Mid common duct stricture status post recent biliary stent placement.  Stent in good place.  However, suspect stent malfunction to some degree due to within stent debris.  Will need to assess.  Patient not appropriate at this time. 3.  Colonic ileus.  Moderately severe. 4.  Appears dehydrated  PLAN:  1.  Continue to monitor biliary drainage 2.  Continue to monitor liver tests, electrolytes, white blood cell count, hemoglobin 3.  Continue antibiotics 4.  Treatment of colonic ileus to include tapwater enema, IV metoclopramide, keep potassium in normal range but above 4.0 with patient side-to-side, avoid pain medication, follow-up x-rays in a.m. 5.  Increase IV fluids per primary service 6.  ERCP timing to be determined.  Continue to clinical status closely.

## 2022-05-28 NOTE — Progress Notes (Signed)
PROGRESS NOTE    Eddie Mendoza  [id number] DOB: [birth year] DOA: 06/13/2022 PCP: [facility]    Brief Narrative:  72 year old male with past medical history of benign prostatic hyperplasia status post TURP in 2022, hypertension, alcohol abuse and recent diagnosis of malignant biliary stricture and ERCP , biopsy and biliary stenting who presented to [facility] emergency department due worsening weakness and lethargy at the advisement of of his outpatient gastroenterologist Dr. Rush Landmark. Was called about pathology report and family stated he is lethargic so asked to come to ER.  patient was recently hospitalized from 2/21 until 2/27 during which patient underwent recent ERCP 05/13/2022 with finding of a long common bile duct stricture status post temporary plastic stent placement in the pancreatic duct and additional plastic stent into the common bile duct.  Patient was found to have a markedly elevated bilirubin 19.9 concerning for pancreatic malignancy.  Pathology was not conclusive.   Upon evaluation in the emergency department imaging revealing findings concerning for multiple hepatic abscesses.  The hospitalist group was then called to assess the patient for mission the hospital.  Admitted with interventional radiology ID consultation. Started on broad-spectrum antibiotics, underwent percutaneous drainage of the liver abscess.  Remains sick, anemia and encephalopathic.   Assessment & Plan:   Multiple liver abscesses versus biliary collections  Obstructive jaundice with malignant stricture, suspected metastatic cancer. Severe sepsis present on admission.  Streptococcal bacteremia.  Continue maintenance IV fluids with potassium supplementation.   Currently remains on IV Zosyn.  Blood cultures with history of mitis, repeat blood cultures today. Followed by infectious disease. Status post percutaneous drainage of the liver abscess, cultures are pending.  Followed by  IR. LFTs are greatly elevated, some improvement of bilirubin today.  Will continue to monitor.  Acute on chronic anemia of chronic disease: Hemoglobin 6.5 on presentation.  Patient hemoglobin 7.5. Currently on Protonix injection. Received total 3 units of PRBC, hemoglobin 9.6 today.  Will need ongoing monitoring.  Acute metabolic encephalopathy: Probably due to acute infection. CT head was normal on admission. Ammonia was 32. Reportedly patient has chronic alcoholism and continues to drink. Currently remains encephalopathic, lethargic. Severe metabolic derangements, poor prognosis.  I will involve palliative care team. Occasional confusion and restlessness, on CIWA scale with low-dose Ativan.  On multivitamins.  Acute kidney injury: Due to 1.  Maintenance IV fluids.  Kidney functions improving.  Ileus: Distended abdomen.  KUB with large amount of gas.  No evidence of small bowel obstruction.  Continue trial of liquids when he is awake. Lactulose enema 3/8.  Will repeat 1 today.  Will not attempt any laxatives by mouth due to distended abdomen.  Electrolytes are replaced.    DVT prophylaxis: SCDs Start: 06/07/2022 0748   Code Status: Full code Family Communication: None at the bedside.  Wife on the phone 3/8. Disposition Plan: Status is: Inpatient Remains inpatient appropriate because: Severe systemic conditions, critically ill.  Remains at the stepdown unit.     Consultants:  GI Infectious disease IR Palliative care  Procedures:  Percutaneous drainage of the liver abscesses 3/7  Antimicrobials:  Zosyn 3/7---   Subjective:  Patient seen in the morning rounds.  Yesterday evening on my examination he was more alert but very lethargic, he was able to voice some answers but incomprehensible.  Overnight he was uncomfortable and restless and was given a dose of fentanyl, a dose of morphine and also 2 doses of Ativan.  Last 1 was at 5.  Now he  is very hard to arouse but looks  comfortable.  Abdomen looks very distended.  Objective: Vitals:   05/28/22 0300 05/28/22 0700 05/28/22 0800 05/28/22 0832  BP: (!) 159/41 (!) 144/53 125/65   Pulse:      Resp: (!) 22 19 (!) 24   Temp:    97.9 F (36.6 C)  TempSrc:    Oral  SpO2:      Weight:      Height:        Intake/Output Summary (Last 24 hours) at 05/28/2022 1028 Last data filed at 05/28/2022 0800 Gross per 24 hour  Intake 1815.57 ml  Output 2030 ml  Net -214.43 ml    Filed Weights   06/04/2022 1100 05/27/22 0446  Weight: 89.2 kg 88.9 kg    Examination:  General exam: Sick looking.  Hard to arouse.  Very lethargic.  Deeply icteric. He follows simple commands.  Without any asterixis.  Without any tremors. Respiratory system: Clear to auscultation.  Poor inspiratory effort. Cardiovascular system: S1 & S2 heard, RRR.  Gastrointestinal system: Soft.  Distended and tympanitic.  No localized tenderness. Percutaneous drainage with mostly biliary and mud colored output.   Data Reviewed: I have personally reviewed following labs and imaging studies  CBC: Recent Labs  Lab 06/06/2022 0508 06/06/2022 1625 05/24/2022 2247 05/27/22 0323 05/27/22 1426 05/27/22 1427 05/28/22 0733  WBC 19.5* 21.3*  --  16.7*  --  19.3* 20.1*  NEUTROABS 16.3* 18.2*  --   --   --  16.3* 17.2*  HGB 6.5* 6.6* 7.2* 6.5* 9.7* 9.6* 10.5*  HCT 19.3* 19.3* 20.9* 18.8* 27.7* 28.2* 31.3*  MCV 87.7 86.2  --  87.9  --  84.7 88.2  PLT 106* 72*  --  65*  --  76* 69*    Basic Metabolic Panel: Recent Labs  Lab 06/08/2022 0508 05/31/2022 1342 05/27/22 0323 05/27/22 1427 05/28/22 0733  NA 134* 137 133* 136 137  K 2.5* 2.5* 3.1* 2.6* 3.6  CL 103 106 110 110 110  CO2 18* 18* 16* 17* 15*  GLUCOSE 108* 102* 125* 128* 104*  BUN 80* 77* 80* 78* 67*  CREATININE 1.97* 1.50* 1.64* 1.61* 1.04  CALCIUM 7.2* 7.4* 6.9* 7.4* 7.3*  MG  --   --   --  3.5*  --   PHOS  --   --   --  4.5  --     GFR: Estimated Creatinine Clearance: 69.4 mL/min (by C-G  formula based on SCr of 1.04 mg/dL). Liver Function Tests: Recent Labs  Lab 05/28/2022 0508 05/30/2022 1342 05/27/22 0323 05/27/22 1427 05/28/22 0733  AST 799* 650* 375* 277* 126*  ALT 344* 325* 228* 222* 155*  ALKPHOS 388* 448* 293* 328* 257*  BILITOT 20.8* 23.0* 16.3* 17.9* 16.5*  PROT 5.2* 5.2* 4.4* 5.4* 4.8*  ALBUMIN <1.5* 1.5* <1.5* <1.5* <1.5*    Recent Labs  Lab 06/06/2022 0508  LIPASE 60*    Recent Labs  Lab 06/15/2022 0509 05/27/22 1427  AMMONIA 30 34    Coagulation Profile: Recent Labs  Lab 05/25/2022 0508 05/27/22 1427  INR 1.5* 1.4*    Cardiac Enzymes: No results for input(s): "CKTOTAL", "CKMB", "CKMBINDEX", "TROPONINI" in the last 168 hours. BNP (last 3 results) No results for input(s): "PROBNP" in the last 8760 hours. HbA1C: No results for input(s): "HGBA1C" in the last 72 hours. CBG: Recent Labs  Lab 06/17/2022 0515  GLUCAP 92    Lipid Profile: No results for input(s): "CHOL", "HDL", "LDLCALC", "TRIG", "CHOLHDL", "  LDLDIRECT" in the last 72 hours. Thyroid Function Tests: No results for input(s): "TSH", "T4TOTAL", "FREET4", "T3FREE", "THYROIDAB" in the last 72 hours. Anemia Panel: Recent Labs    06/05/2022 0800  VITAMINB12 1,498*  FOLATE 16.5  FERRITIN 1,174*  TIBC 186*  IRON 33*  RETICCTPCT 1.1    Sepsis Labs: Recent Labs  Lab 06/15/2022 0509 06/12/2022 0658 05/27/22 1426  LATICACIDVEN 2.0* 1.6 1.4     Recent Results (from the past 240 hour(s))  Blood culture (routine x 2)     Status: Abnormal (Preliminary result)   Collection Time: 06/15/2022  5:20 AM   Specimen: BLOOD  Result Value Ref Range Status   Specimen Description   Final    BLOOD LEFT ANTECUBITAL Performed at [facility] [address], [place], [place] [address]    Special Requests   Final    BOTTLES DRAWN AEROBIC AND ANAEROBIC Blood Culture results may not be optimal due to an excessive volume of blood received in culture bottles Performed at [facility] [address], [place], [place] [address]    Culture  Setup Time   Final    GRAM NEGATIVE RODS ANAEROBIC BOTTLE ONLY CRITICAL RESULT CALLED TO, READ BACK BY AND VERIFIED WITH: PHARMD A. [facility] [id number] @ [address] [place] GRAM POSITIVE COCCI IN CHAINS BOTTLES DRAWN AEROBIC ONLY CRITICAL VALUE NOTED.  VALUE IS CONSISTENT WITH PREVIOUSLY REPORTED AND CALLED VALUE. Performed at [facility], [place] [address], [place], [place] [address]    Culture STREPTOCOCCUS MITIS/ORALIS GRAM NEGATIVE RODS  (A)  Final   Report Status PENDING  Incomplete   Organism ID, Bacteria STREPTOCOCCUS MITIS/ORALIS  Final      Susceptibility   Streptococcus mitis/oralis - MIC*    TETRACYCLINE >=16 RESISTANT Resistant     VANCOMYCIN 0.5 SENSITIVE Sensitive     CLINDAMYCIN >=1 RESISTANT Resistant     * STREPTOCOCCUS MITIS/ORALIS  Blood Culture ID Panel (Reflexed)     Status: Abnormal   Collection Time: 06/19/2022  5:20 AM  Result Value Ref Range Status   Enterococcus faecalis NOT DETECTED NOT DETECTED Final   Enterococcus Faecium NOT DETECTED NOT DETECTED Final   Listeria monocytogenes NOT DETECTED NOT DETECTED Final   Staphylococcus species NOT DETECTED NOT DETECTED Final   Staphylococcus aureus (BCID) NOT DETECTED NOT DETECTED Final   Staphylococcus epidermidis NOT DETECTED NOT DETECTED Final   Staphylococcus lugdunensis NOT DETECTED NOT DETECTED Final   Streptococcus species DETECTED (A) NOT DETECTED Final    Comment: Not Enterococcus species, Streptococcus agalactiae, Streptococcus pyogenes, or Streptococcus pneumoniae. CRITICAL RESULT CALLED TO, READ BACK BY AND VERIFIED WITH: PHARMD A. Nelia Shi [id number] @ 2051 FH    Streptococcus agalactiae NOT DETECTED NOT DETECTED Final   Streptococcus pneumoniae NOT DETECTED NOT DETECTED Final   Streptococcus pyogenes NOT DETECTED NOT DETECTED Final   A.calcoaceticus-baumannii NOT DETECTED NOT DETECTED Final   Bacteroides fragilis DETECTED (A) NOT  DETECTED Final    Comment: CRITICAL RESULT CALLED TO, READ BACK BY AND VERIFIED WITH: PHARMD A. [facility] [id number] @ [address] [place]    Enterobacterales NOT DETECTED NOT DETECTED Final   Enterobacter cloacae complex NOT DETECTED NOT DETECTED Final   Escherichia coli NOT DETECTED NOT DETECTED Final   Klebsiella aerogenes NOT DETECTED NOT DETECTED Final   Klebsiella oxytoca NOT DETECTED NOT DETECTED Final   Klebsiella pneumoniae NOT DETECTED NOT DETECTED Final   Proteus species NOT DETECTED NOT DETECTED Final   Salmonella species NOT DETECTED NOT DETECTED Final  Serratia marcescens NOT DETECTED NOT DETECTED Final   Haemophilus influenzae NOT DETECTED NOT DETECTED Final   Neisseria meningitidis NOT DETECTED NOT DETECTED Final   Pseudomonas aeruginosa NOT DETECTED NOT DETECTED Final   Stenotrophomonas maltophilia NOT DETECTED NOT DETECTED Final   Candida albicans NOT DETECTED NOT DETECTED Final   Candida auris NOT DETECTED NOT DETECTED Final   Candida glabrata NOT DETECTED NOT DETECTED Final   Candida krusei NOT DETECTED NOT DETECTED Final   Candida parapsilosis NOT DETECTED NOT DETECTED Final   Candida tropicalis NOT DETECTED NOT DETECTED Final   Cryptococcus neoformans/gattii NOT DETECTED NOT DETECTED Final    Comment: Performed at [facility], [place] [address] [place], [place] [address]  Blood culture (routine x 2)     Status: Abnormal (Preliminary result)   Collection Time: 06/07/2022  5:37 AM   Specimen: BLOOD LEFT FOREARM  Result Value Ref Range Status   Specimen Description   Final    BLOOD LEFT FOREARM Performed at [facility], [place] [address] [place], [place] [address]    Special Requests   Final    BOTTLES DRAWN AEROBIC AND ANAEROBIC Blood Culture adequate volume Performed at [facility] [address] [place], [place] [address]    Culture  Setup Time   Final    GRAM POSITIVE COCCI IN CHAINS IN BOTH AEROBIC AND ANAEROBIC BOTTLES CRITICAL RESULT  CALLED TO, READ BACK BY AND VERIFIED WITH: PHARMD A. Nelia Shi [id number] @ 2051 FH    Culture (A)  Final    STREPTOCOCCUS MITIS/ORALIS SUSCEPTIBILITIES PERFORMED ON PREVIOUS CULTURE WITHIN THE LAST 5 DAYS. Performed at [facility], [place] [address] [place], [place] [address]    Report Status PENDING  Incomplete  Blood Culture ID Panel (Reflexed)     Status: Abnormal   Collection Time: 06/07/2022  5:37 AM  Result Value Ref Range Status   Enterococcus faecalis NOT DETECTED NOT DETECTED Final   Enterococcus Faecium NOT DETECTED NOT DETECTED Final   Listeria monocytogenes NOT DETECTED NOT DETECTED Final   Staphylococcus species NOT DETECTED NOT DETECTED Final   Staphylococcus aureus (BCID) NOT DETECTED NOT DETECTED Final   Staphylococcus epidermidis NOT DETECTED NOT DETECTED Final   Staphylococcus lugdunensis NOT DETECTED NOT DETECTED Final   Streptococcus species DETECTED (A) NOT DETECTED Final    Comment: Not Enterococcus species, Streptococcus agalactiae, Streptococcus pyogenes, or Streptococcus pneumoniae. CRITICAL RESULT CALLED TO, READ BACK BY AND VERIFIED WITH: PHARMD A. Nelia Shi [id number] @ 2051 [facility]    Streptococcus agalactiae NOT DETECTED NOT DETECTED Final   Streptococcus pneumoniae NOT DETECTED NOT DETECTED Final   Streptococcus pyogenes NOT DETECTED NOT DETECTED Final   A.calcoaceticus-baumannii NOT DETECTED NOT DETECTED Final   Bacteroides fragilis DETECTED (A) NOT DETECTED Final    Comment: CRITICAL RESULT CALLED TO, READ BACK BY AND VERIFIED WITH: PHARMD A. [facility] [id number] @ [address] [place]    Enterobacterales NOT DETECTED NOT DETECTED Final   Enterobacter cloacae complex NOT DETECTED NOT DETECTED Final   Escherichia coli NOT DETECTED NOT DETECTED Final   Klebsiella aerogenes NOT DETECTED NOT DETECTED Final   Klebsiella oxytoca NOT DETECTED NOT DETECTED Final   Klebsiella pneumoniae NOT DETECTED NOT DETECTED Final   Proteus species NOT DETECTED NOT DETECTED Final   Salmonella  species NOT DETECTED NOT DETECTED Final   Serratia marcescens NOT DETECTED NOT DETECTED Final   Haemophilus influenzae NOT DETECTED NOT DETECTED Final   Neisseria meningitidis NOT DETECTED NOT DETECTED Final  Pseudomonas aeruginosa NOT DETECTED NOT DETECTED Final   Stenotrophomonas maltophilia NOT DETECTED NOT DETECTED Final   Candida albicans NOT DETECTED NOT DETECTED Final   Candida auris NOT DETECTED NOT DETECTED Final   Candida glabrata NOT DETECTED NOT DETECTED Final   Candida krusei NOT DETECTED NOT DETECTED Final   Candida parapsilosis NOT DETECTED NOT DETECTED Final   Candida tropicalis NOT DETECTED NOT DETECTED Final   Cryptococcus neoformans/gattii NOT DETECTED NOT DETECTED Final    Comment: Performed at [facility], [place] [address] [place], [place] [address]  MRSA Next Gen by PCR, Nasal     Status: None   Collection Time: 06/17/2022  9:37 AM   Specimen: Nasal Mucosa; Nasal Swab  Result Value Ref Range Status   MRSA by PCR Next Gen NOT DETECTED NOT DETECTED Final    Comment: (NOTE) The GeneXpert MRSA Assay (FDA approved for NASAL specimens only), is one component of a comprehensive MRSA colonization surveillance program. It is not intended to diagnose MRSA infection nor to guide or monitor treatment for MRSA infections. Test performance is not FDA approved in patients less than 34 years old. Performed at [facility], [place] [address], [place], [place] [address]   Aerobic/Anaerobic Culture w Gram Stain (surgical/deep wound)     Status: None (Preliminary result)   Collection Time: 06/03/2022  1:00 PM   Specimen: Liver; Abscess  Result Value Ref Range Status   Specimen Description   Final    LIVER Performed at [facility] [address], [place], [place] [address]    Special Requests   Final    NONE Performed at [facility], [place] [address], [place], [place] [address]    Gram Stain   Final    NO WBC  SEEN ABUNDANT GRAM NEGATIVE RODS ABUNDANT GRAM POSITIVE COCCI IN CHAINS    Culture   Final    ABUNDANT STREPTOCOCCUS PARASANGUINIS ABUNDANT STAPHYLOCOCCUS HAEMOLYTICUS CULTURE REINCUBATED FOR BETTER GROWTH Performed at [facility], [place] [address], [place], [place] [address]    Report Status PENDING  Incomplete         Radiology Studies: DG Abd 1 View  Result Date: 05/28/2022 CLINICAL DATA:  Abdominal distension EXAM: ABDOMEN - 1 VIEW COMPARISON:  05/30/2022 FINDINGS: Two supine frontal views of the abdomen and pelvis are obtained. The biliary and pancreatic stents seen previously are again identified. Pigtail drainage catheter overlies the liver. No evidence of bowel obstruction. Moderate gaseous distention of the colon could reflect ileus. There are no masses or abnormal calcifications. Lung bases are clear. IMPRESSION: 1. Support devices as above. 2. Moderate gaseous distention of the colon which could reflect colonic ileus. No evidence of bowel obstruction. Electronically Signed   By: Randa Ngo M.D.   On: 05/28/2022 09:29   IR US Guide Bx Asp/Drain  Result Date: 05/28/2022 INDICATION: HEPATIC ABSCESS, OBSTRUCTIVE JAUNDICE EXAM: ULTRASOUND RIGHT HEPATIC ABSCESS DRAIN PLACEMENT MEDICATIONS: The patient is currently admitted to the hospital and receiving intravenous antibiotics. The antibiotics were administered within an appropriate time frame prior to the initiation of the procedure. ANESTHESIA/SEDATION: Moderate (conscious) sedation was employed during this procedure. A total of Versed 0.5 mg and Fentanyl 50 mcg was administered intravenously by the radiology nurse. Total intra-service moderate Sedation Time: 10 minutes. The patient's level of consciousness and vital signs were monitored continuously by radiology nursing throughout the procedure under my direct supervision. COMPLICATIONS: None immediate. PROCEDURE: Informed written consent was obtained from the  patient after a  thorough discussion of the procedural risks, benefits and alternatives. All questions were addressed. Maximal Sterile Barrier Technique was utilized including caps, mask, sterile gowns, sterile gloves, sterile drape, hand hygiene and skin antiseptic. A timeout was performed prior to the initiation of the procedure. previous imaging reviewed. preliminary ultrasound performed. the posterior right hepatic abscess was localized and marked through a lower intercostal space in the mid axillary line. Under sterile conditions and local anesthesia, percutaneous needle access performed of the inferior right hepatic abscess with an 18 gauge 15 cm needle. Needle position confirmed with ultrasound. Images obtained for documentation. There was return of exudative bile compatible with abscess or infected biloma. Guidewire inserted. Guidewire position confirmed with ultrasound. Tract dilatation performed to insert a 10 [place] drain. Drain catheter position confirmed with ultrasound. Syringe aspiration yielded 115 cc exudative bilious fluid. Catheter secured with a silk suture connected to external suction bulb. Sterile dressing applied. No immediate complication. Patient tolerated the procedure well. IMPRESSION: Successful ultrasound-guided right hepatic abscess drainage catheter placement. Electronically Signed   By: Jerilynn Mages.  Shick M.D.   On: 06/09/2022 13:57        Scheduled Meds:  Chlorhexidine Gluconate Cloth  6 each Topical Daily   famotidine  40 mg Oral Daily   finasteride  5 mg Oral Daily   folic acid  1 mg Oral Daily   multivitamin with minerals  1 tablet Oral Daily   pantoprazole (PROTONIX) IV  40 mg Intravenous Q12H   sodium bicarbonate  650 mg Oral BID   sodium chloride flush  3 mL Intravenous Q12H   sodium chloride flush  5 mL Intracatheter Q8H   thiamine  100 mg Oral Daily   Or   thiamine  100 mg Intravenous Daily   Continuous Infusions:  lactated ringers 1,000 mL with potassium chloride 40 mEq infusion  125 mL/hr at 05/28/22 0349   piperacillin-tazobactam (ZOSYN)  IV 3.375 g (05/28/22 0528)     LOS: 2 days    Time spent: 50 minutes    Barb Merino, MD [facility] Pager [phone]

## 2022-05-28 NOTE — Consult Note (Incomplete)
Palliative Care Consult Note                                  Date: 05/28/2022   Patient Name: Eddie Mendoza  DOB: 15-Sep-1950  MRN: [id number]  Age / Sex: 72 y.o., male  PCP: [facility] Referring Physician: Barb Merino, MD  Reason for Consultation: {Reason for Consult:23484}  HPI/Patient Profile: Palliative Care consult requested for goals of care discussion in this 72 y.o. male  with past medical history of *** admitted on 06/07/2022 with ***.   Past Medical History:  Diagnosis Date   Abnormal CT scan, gallbladder 08/13/2020   Hypertension      Subjective:   This NP Eddie Mendoza reviewed medical records, received report from team, assessed the patient and then met at the patient's bedside with *** to discuss diagnosis, prognosis, GOC, EOL wishes disposition and options.   Concept of Palliative Care was introduced as specialized medical care for people and their families living with serious illness.  It focuses on providing relief from the symptoms and stress of a serious illness.  The goal is to improve quality of life for both the patient and the family. Values and goals of care important to patient and family were attempted to be elicited.  Created space and opportunity for patient and family to explore state of health prior to admission, thoughts, and feelings.   We discussed His current illness and what it means in the larger context of His on-going co-morbidities. Natural disease trajectory and expectations were discussed.  **verbalized understanding of current illness and co-morbidities.   I discussed the importance of continued conversation with family and their medical providers regarding overall plan of care and treatment options, ensuring decisions are within the context of the patients values and GOCs.  Questions and concerns were addressed.  Hard Choices booklet left for review. The family was encouraged to  call with questions or concerns.  PMT will continue to support holistically as needed.  Life Review: ***   Objective:   Primary Diagnoses: Present on Admission:  Obstructive jaundice  Acute blood loss anemia  Biliary obstruction   Scheduled Meds:  Chlorhexidine Gluconate Cloth  6 each Topical Daily   famotidine  40 mg Oral Daily   finasteride  5 mg Oral Daily   folic acid  1 mg Oral Daily   metoCLOPramide (REGLAN) injection  10 mg Intravenous Q6H   multivitamin with minerals  1 tablet Oral Daily   pantoprazole (PROTONIX) IV  40 mg Intravenous Q12H   sodium bicarbonate  650 mg Oral BID   sodium chloride flush  3 mL Intravenous Q12H   sodium chloride flush  5 mL Intracatheter Q8H   thiamine  100 mg Oral Daily   Or   thiamine  100 mg Intravenous Daily    Continuous Infusions:  lactated ringers 1,000 mL with potassium chloride 40 mEq infusion 125 mL/hr at 05/28/22 1031   piperacillin-tazobactam (ZOSYN)  IV Stopped (05/28/22 0957)    PRN Meds: fentaNYL (SUBLIMAZE) injection **OR** fentaNYL (SUBLIMAZE) injection, hydrOXYzine, LORazepam **OR** LORazepam, ondansetron **OR** ondansetron (ZOFRAN) IV, mouth rinse  No Known Allergies  Review of Systems Unless otherwise noted, a complete review of systems is negative.  Physical Exam General: NAD, frail chronically-ill appearing Cardiovascular: regular rate and rhythm Pulmonary: clear ant fields, diminished bilaterally  Abdomen: soft, nontender, + bowel sounds Extremities: no edema, no joint deformities Skin: no  rashes, warm and dry Neurological:   Vital Signs:  BP 125/65   Pulse 69   Temp 97.9 F (36.6 C) (Oral)   Resp (!) 24   Ht 6' (1.829 m)   Wt 88.9 kg   SpO2 97%   BMI 26.58 kg/m  Pain Scale: PAINAD   Pain Score: 0-No pain  SpO2: SpO2: 97 % O2 Device:SpO2: 97 % O2 Flow Rate: .O2 Flow Rate (L/min): 2 L/min  IO: Intake/output summary:  Intake/Output Summary (Last 24 hours) at 05/28/2022 1230 Last data  filed at 05/28/2022 1031 Gross per 24 hour  Intake 2175.12 ml  Output 1955 ml  Net 220.12 ml    LBM: Last BM Date : 05/28/22 Baseline Weight: Weight: 89.2 kg Most recent weight: Weight: 88.9 kg      Palliative Assessment/Data: ***   Advanced Care Planning:   Primary Decision Maker: {Primary Decision EZ:[id number]  Code Status/Advance Care Planning: {Palliative Code status:23503}  A discussion was had today regarding advanced directives. Concepts specific to code status, artifical feeding and hydration, continued IV antibiotics and rehospitalization was had.  The difference between a aggressive medical intervention path and a palliative comfort care path was discussed. ***The MOST form was introduced and discussed.***  Hospice and Palliative Care services outpatient were explained and offered. Patient and family verbalized their understanding and awareness of both palliative and hospice's goals and philosophy of care.   Assessment & Plan:   SUMMARY OF RECOMMENDATIONS   DNR/DNI/Full Code-as confirmed by  Continue with current plan of care  PMT will continue to support and follow as needed. Please call team line with urgent needs.  Symptom Management:  Per Attending/See below  Palliative Prophylaxis:  {Palliative Prophylaxis:21015}  Additional Recommendations (Limitations, Scope, Preferences): {Recommended Scope and Preferences:21019}  Psycho-social/Spiritual:  Desire for further Chaplaincy support: {YES NO:22349} Additional Recommendations: {PAL SOCIAL:21064}  Prognosis:  {Palliative Care Prognosis:23504}  Discharge Planning:  {Palliative dispostion:23505}   Discussed with:     Patient*** expressed understanding and was in agreement with this plan.   Time In: *** Time Out: *** Time Total: ***  Visit consisted of counseling and education dealing with the complex and emotionally intense issues of symptom management and palliative care in the setting of serious  and potentially life-threatening illness.Greater than 50%  of this time was spent counseling and coordinating care related to the above assessment and plan.  Signed by:  Eddie Mendoza, AGPCNP-BC [place]   Phone: [phone] Pager: [phone] Amion: Eddie Mendoza   Thank you for allowing the Palliative Medicine Team to assist in the care of this patient. Please utilize secure chat with additional questions, if there is no response within 30 minutes please call the above phone number. Palliative Medicine Team providers are available by phone from 7am to 5pm daily and can be reached through the team cell phone.  Should this patient require assistance outside of these hours, please call the patient's attending physician.  *Please note that this is a verbal dictation therefore any spelling or grammatical errors are due to the "[place] [employer]" system interpretation.

## 2022-05-28 NOTE — Progress Notes (Signed)
  Echocardiogram 2D Echocardiogram has been performed.  Eddie Mendoza 05/28/2022, 2:22 PM

## 2022-05-28 NOTE — Progress Notes (Signed)
   05/28/22 1640  Spiritual Encounters  Type of Visit Initial  Care provided to: Family  Referral source Nurse (RN/NT/LPN)  Reason for visit Urgent spiritual support  OnCall Visit Yes   Chaplain responded to a spiritual consult for support of family and the patient. The patient, Eddie Mendoza was sleeping soundly during my visit. I spoke and prayed with [name]. During my time there Cornelis's sisters, Lattie Haw and Stanton Kidney came to visit with him. The family spoke of Delvon's health and were sorry to see him in pain.  They were supporting one another and so I stepped away.   Danice Goltz  [facility] [phone]

## 2022-05-28 NOTE — IPAL (Signed)
  Interdisciplinary Goals of Care Family Meeting   Date carried out: 05/28/2022  Location of the meeting: Bedside  Member's involved: Physician, Bedside Registered Nurse, and Family Member or next of kin  Eddie Mendoza patient's partner for 25 years, Eddie Mendoza [phone] patient's only daughter who is involved in his care. Durable Power of Tour manager: Domestic partner and daughter.  Discussion: We discussed goals of care for [name] .  Multiple discussions with the patient's domestic partner about seriousness of patient's illness.  Patient has remained encephalopathic.  Underlying disease is either widespread metastatic disease or widespread metastatic infection with poor chances of recovery, liver failure.  Patient has not communicated meaningful for the last 48 hours. We discussed goals of care and difficulties with recovery and improvement at this stage. Both [name] and daughter Eddie Mendoza agreed with severity of illness and focusing on comfort if decompensates. Current plan will remain to continue to treat him with antibiotics, IV fluids and other symptomatic management. Patient is in no condition to go for anesthesia, surgery.  Any decompensation will be end-of-life for him. Discussed about CODE STATUS and futility of any resuscitation measures.  All parties agreed position recommendation of DNR/DNI.  If patient to further decompensate, they are agreeable to treat him as comfort care and hospice.  Code status:   Code Status: DNR   Disposition: Continue current acute care  Time spent for the meeting: 48 minutes    Barb Merino, MD  05/28/2022, 2:22 PM

## 2022-05-29 ENCOUNTER — Inpatient Hospital Stay (HOSPITAL_COMMUNITY): Payer: No Typology Code available for payment source

## 2022-05-29 DIAGNOSIS — K750 Abscess of liver: Secondary | ICD-10-CM | POA: Diagnosis not present

## 2022-05-29 DIAGNOSIS — K831 Obstruction of bile duct: Secondary | ICD-10-CM | POA: Diagnosis not present

## 2022-05-29 LAB — COMPREHENSIVE METABOLIC PANEL
ALT: 114 U/L — ABNORMAL HIGH (ref 0–44)
AST: 73 U/L — ABNORMAL HIGH (ref 15–41)
Albumin: <1.5 g/dL — ABNORMAL LOW (ref 3.5–5.0)
Alkaline Phosphatase: 215 U/L — ABNORMAL HIGH (ref 38–126)
Anion gap: 12 (ref 5–15)
BUN: 55 mg/dL — ABNORMAL HIGH (ref 8–23)
CO2: 16 mmol/L — ABNORMAL LOW (ref 22–32)
Calcium: 7.6 mg/dL — ABNORMAL LOW (ref 8.9–10.3)
Chloride: 117 mmol/L — ABNORMAL HIGH (ref 98–111)
Creatinine, Ser: 1.25 mg/dL — ABNORMAL HIGH (ref 0.61–1.24)
GFR, Estimated: >60 mL/min (ref >60)
Glucose, Bld: 100 mg/dL — ABNORMAL HIGH (ref 70–99)
Potassium: 3.9 mmol/L (ref 3.5–5.1)
Sodium: 145 mmol/L (ref 135–145)
Total Bilirubin: 14.8 mg/dL — ABNORMAL HIGH (ref 0.3–1.2)
Total Protein: 4.8 g/dL — ABNORMAL LOW (ref 6.5–8.1)

## 2022-05-29 LAB — TYPE AND SCREEN
ABO/RH(D): A POS
Antibody Screen: NEGATIVE
Unit division: 0
Unit division: 0
Unit division: 0
Unit division: 0

## 2022-05-29 LAB — CBC WITH DIFFERENTIAL/PLATELET
Abs Immature Granulocytes: 0.34 10*3/uL — ABNORMAL HIGH (ref 0.00–0.07)
Basophils Absolute: 0.1 10*3/uL (ref 0.0–0.1)
Basophils Relative: 0 %
Eosinophils Absolute: 0.1 10*3/uL (ref 0.0–0.5)
Eosinophils Relative: 0 %
HCT: 25.6 % — ABNORMAL LOW (ref 39.0–52.0)
Hemoglobin: 8.9 g/dL — ABNORMAL LOW (ref 13.0–17.0)
Immature Granulocytes: 2 %
Lymphocytes Relative: 2 %
Lymphs Abs: 0.4 10*3/uL — ABNORMAL LOW (ref 0.7–4.0)
MCH: 30.1 pg (ref 26.0–34.0)
MCHC: 34.8 g/dL (ref 30.0–36.0)
MCV: 86.5 fL (ref 80.0–100.0)
Monocytes Absolute: 1.5 10*3/uL — ABNORMAL HIGH (ref 0.1–1.0)
Monocytes Relative: 8 %
Neutro Abs: 18.1 10*3/uL — ABNORMAL HIGH (ref 1.7–7.7)
Neutrophils Relative %: 88 %
Platelets: 122 10*3/uL — ABNORMAL LOW (ref 150–400)
RBC: 2.96 MIL/uL — ABNORMAL LOW (ref 4.22–5.81)
RDW: 18.2 % — ABNORMAL HIGH (ref 11.5–15.5)
WBC: 20.5 10*3/uL — ABNORMAL HIGH (ref 4.0–10.5)
nRBC: 0 % (ref 0.0–0.2)

## 2022-05-29 LAB — BPAM RBC
Blood Product Expiration Date: 202403292359
Blood Product Expiration Date: 202404022359
Blood Product Expiration Date: 202404022359
Blood Product Expiration Date: 202404032359
ISSUE DATE / TIME: 202403070821
ISSUE DATE / TIME: 202403071821
ISSUE DATE / TIME: 202403080428
ISSUE DATE / TIME: 202403080916
Unit Type and Rh: 6200
Unit Type and Rh: 6200
Unit Type and Rh: 6200
Unit Type and Rh: 6200

## 2022-05-29 MED ORDER — MORPHINE SULFATE (PF) 2 MG/ML IV SOLN
2.0000 mg | INTRAVENOUS | Status: DC | PRN
  Administered 2022-05-29: 2 mg via INTRAVENOUS
  Filled 2022-05-29: qty 1

## 2022-05-29 MED ORDER — GLYCOPYRROLATE 0.2 MG/ML IJ SOLN
0.1000 mg | Freq: Three times a day (TID) | INTRAMUSCULAR | Status: DC
  Administered 2022-05-29 – 2022-06-02 (×14): 0.1 mg via INTRAVENOUS
  Filled 2022-05-29 (×14): qty 1

## 2022-05-29 MED ORDER — HYDROMORPHONE BOLUS VIA INFUSION
1.0000 mg | INTRAVENOUS | Status: DC | PRN
  Administered 2022-05-31 – 2022-06-01 (×3): 1 mg via INTRAVENOUS

## 2022-05-29 MED ORDER — HYDROMORPHONE HCL-NACL 50-0.9 MG/50ML-% IV SOLN
1.0000 mg/h | INTRAVENOUS | Status: DC
  Administered 2022-05-29 – 2022-06-01 (×3): 1 mg/h via INTRAVENOUS
  Filled 2022-05-29 (×4): qty 50

## 2022-05-29 MED ORDER — LIP MEDEX EX OINT
TOPICAL_OINTMENT | CUTANEOUS | Status: DC | PRN
  Administered 2022-05-29: 1 via TOPICAL
  Filled 2022-05-29: qty 7

## 2022-05-29 NOTE — Progress Notes (Signed)
HISTORY OF PRESENT ILLNESS:  Eddie Mendoza is a 72 y.o. 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  He was admitted with jaundice and multiple hepatic abscesses the largest abscess was drained percutaneously.  Remains on antibiotics.  He did undergo plain films of the abdomen yesterday for worsening abdominal distention.  Found to have ileus pattern.  Measures instituted.  No vomiting.  Follow-up x-ray today looks improved.  He has been afebrile normal blood pressure.   However, he has had progressive obtundation.  Exhibiting agonal breathing.  Now comfort measures with her feet to be started.  Still with bilious drainage.  25 cc in drain currently.  Despite progressive clinical decline, his liver test continue to improve.  Today 73/114/215/14.8.  White blood cell count 20.5.  REVIEW OF SYSTEMS:  Noncontributory Past Medical History:  Diagnosis Date   Abnormal CT scan, gallbladder 08/13/2020   Hypertension     Past Surgical History:  Procedure Laterality Date   BILIARY BRUSHING  05/13/2022   Procedure: BILIARY BRUSHING;  Surgeon: Rush Landmark Telford Nab., MD;  Location: [place];  Service: Gastroenterology;;   BILIARY STENT PLACEMENT  05/13/2022   Procedure: BILIARY STENT PLACEMENT;  Surgeon: Irving Copas., MD;  Location: [place];  Service: Gastroenterology;;   BIOPSY  05/13/2022   Procedure: BIOPSY;  Surgeon: Irving Copas., MD;  Location: [place];  Service: Gastroenterology;;   ERCP N/A 05/13/2022   Procedure: ENDOSCOPIC RETROGRADE CHOLANGIOPANCREATOGRAPHY (ERCP);  Surgeon: Irving Copas., MD;  Location: [place];  Service: Gastroenterology;  Laterality: N/A;   ESOPHAGOGASTRODUODENOSCOPY N/A 05/13/2022   Procedure: ESOPHAGOGASTRODUODENOSCOPY (EGD);  Surgeon: Irving Copas., MD;  Location: [place];  Service: Gastroenterology;  Laterality: N/A;   EUS N/A 05/13/2022   Procedure: FULL UPPER  ENDOSCOPIC ULTRASOUND (EUS) RADIAL;  Surgeon: Rush Landmark Telford Nab., MD;  Location: [place];  Service: Gastroenterology;  Laterality: N/A;   FINE NEEDLE ASPIRATION  05/13/2022   Procedure: FINE NEEDLE ASPIRATION (FNA) LINEAR;  Surgeon: Irving Copas., MD;  Location: [place];  Service: Gastroenterology;;   IR US GUIDE BX ASP/DRAIN  06/08/2022   PANCREATIC STENT PLACEMENT  05/13/2022   Procedure: PANCREATIC STENT PLACEMENT;  Surgeon: Irving Copas., MD;  Location: [place];  Service: Gastroenterology;;   REMOVAL OF STONES  05/13/2022   Procedure: REMOVAL OF STONES;  Surgeon: Irving Copas., MD;  Location: [place];  Service: Gastroenterology;;   [name]  05/13/2022   Procedure: [name];  Surgeon: Irving Copas., MD;  Location: [place];  Service: Gastroenterology;;   TRANSURETHRAL RESECTION OF BLADDER TUMOR N/A 06/30/2020   Procedure:  Almyra Free OF BLEEDING, CLOT EVACUATION;  Surgeon: Ardis Hughs, MD;  Location: [facility];  Service: Urology;  Laterality: N/A;   TRANSURETHRAL RESECTION OF BLADDER TUMOR N/A 08/13/2020   Procedure: CYSTOSCOPY, CLOT EVACUATION, FULGERATION;  Surgeon: Ardis Hughs, MD;  Location: [facility];  Service: Urology;  Laterality: N/A;    Social History Elder R Yetter  reports that he has been smoking cigarettes. He has a 15.00 pack-year smoking history. He has never used smokeless tobacco. He reports current alcohol use of about 3.0 standard drinks of alcohol per week. He reports that he does not use drugs.  family history is not on file.  No Known Allergies     PHYSICAL EXAMINATION: Vital signs: BP (!) 146/48   Pulse 85   Temp 100.2 F (37.9 C) (Oral)   Resp (!) 23   Ht 6' (1.829  m)   Wt 88.9 kg   SpO2 93%   BMI 26.58 kg/m   Constitutional: Critically ill-appearing.  Obtunded with agonal breathing Psychiatric: Obtunded Eyes: Anicteric Abdomen: Less distended with bowel  sounds Neuro: Obtunded  ASSESSMENT:  1.  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  Admitted ill with multiple new and large hepatic abscesses, status post needs drainage with progressive improvement in liver tests but persistent leukocytosis.  Has not had shock or fever 3.  Poor clinical course with progressive obtundation.  Comfort care noted  PLAN:  1.  Plans for comfort care noted  Will sign off

## 2022-05-29 NOTE — Progress Notes (Signed)
Daily Progress Note   Patient Name: Eddie Mendoza       Date: 05/29/2022 DOB: 1950-07-15  Age: 72 y.o. MRN#: [id number] Attending Physician: Shelly Coss, MD Primary Care Physician: [facility] Admit Date: 06/17/2022  Reason for Consultation/Follow-up: Terminal Care  Subjective: Reviewed, patient seen and examined, nursing colleagues present in the room. Patient is unresponsive, appears to be in moderate to severe generalized distress, mild to moderate respiratory distress.  Length of Stay: 3  Current Medications: Scheduled Meds:   glycopyrrolate  0.1 mg Intravenous TID   sodium chloride flush  3 mL Intravenous Q12H   sodium chloride flush  5 mL Intracatheter Q8H    Continuous Infusions:  HYDROmorphone     lactated ringers 1,000 mL with potassium chloride 40 mEq infusion 125 mL/hr at 05/29/22 1018    PRN Meds: HYDROmorphone **AND** HYDROmorphone, hydrOXYzine, LORazepam **OR** LORazepam, ondansetron **OR** ondansetron (ZOFRAN) IV, mouth rinse  Physical Exam         He is uncomfortable appears in moderate to severe distress Chronically ill-appearing Monitor noted Has percutaneous drain Has edema Unresponsive  Vital Signs: BP (!) 146/48   Pulse 85   Temp 100.2 F (37.9 C) (Oral)   Resp (!) 23   Ht 6' (1.829 m)   Wt 88.9 kg   SpO2 93%   BMI 26.58 kg/m  SpO2: SpO2: 93 % O2 Device: O2 Device: Room Air O2 Flow Rate: O2 Flow Rate (L/min): 2 L/min  Intake/output summary:  Intake/Output Summary (Last 24 hours) at 05/29/2022 1327 Last data filed at 05/29/2022 1018 Gross per 24 hour  Intake 2700.6 ml  Output 2260 ml  Net 440.6 ml   LBM: Last BM Date : 05/28/22 Baseline Weight: Weight: 89.2 kg Most recent weight: Weight: 88.9 kg       Palliative  Assessment/Data:      Patient Active Problem List   Diagnosis Date Noted   Paralytic ileus of large intestine (HCC) 05/28/2022   Abnormal CT of the abdomen 05/28/2022   Elevated liver function tests 05/27/2022   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   Hepatic abscess [id number]   Acute metabolic encephalopathy Q000111Q   Transaminitis 05/17/2022   Anemia 05/16/2022   Leucocytosis 05/15/2022   Biliary obstruction 05/15/2022   Elevated liver enzymes 05/12/2022   Hyponatremia 05/12/2022   Normal anion  gap metabolic acidosis [id number]   Alcohol use disorder 05/12/2022   Pancreatic mass 05/12/2022   Bladder mass 05/12/2022   Enlarged prostate 05/12/2022   Obstructive jaundice 05/12/2022   Abnormal CT scan, gallbladder 08/13/2020   Gross hematuria 08/12/2020     06/28/2020   Acute blood loss anemia 06/28/2020   COVID-19 virus infection 06/28/2020   Hematuria 06/20/2020   Essential hypertension 06/20/2020    Palliative Care Assessment & Plan   Patient Profile:  72 y.o. 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 He was admitted on 06/06/2022 from home with generalized weakness.    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  Also discussed with other members of the interdisciplinary team.  Call placed and I was able to reach significant other of 25 years Ms. Kellie Simmering.  She is thankful for  information provided, she recalls discussing with hospital medicine attending earlier today about the patient's dire situation and the fact that he is not going to get better.  Hence, we discussed about comfort measures.  She does not want the patient to get morphine.  Explained about the need for continuous opioid infusion for essentially aggressive symptom management at end-of-life with-Ms. Kellie Simmering is agreeable to Dilaudid infusion. Medication history reviewed.  Place Foley for comfort.  Continue current scope of comfort measures.  Anticipate hospital death. Palliative performance scale 20%.  Goals of Care and Additional Recommendations: Limitations on Scope of Treatment: Full Comfort Care  Code Status:    Code Status Orders  (From admission, onward)           Start     Ordered   05/28/22 1421  Do not attempt resuscitation (DNR)  Continuous       Question Answer Comment  If patient has no pulse and is not breathing Do Not Attempt Resuscitation   If patient has a pulse and/or is breathing: Medical Treatment Goals LIMITED ADDITIONAL INTERVENTIONS: Use medication/IV fluids and cardiac monitoring as indicated; Do not use intubation or mechanical ventilation (DNI), also provide comfort medications.  Transfer to Progressive/Stepdown as indicated, avoid Intensive Care.   Consent: Discussion documented in EHR or advanced directives reviewed      05/28/22 1421           Code Status History     Date Active Date Inactive Code Status Order ID Comments User Context   06/09/2022 0749 05/28/2022 1421 Full Code [id number]  Samella Parr, NP ED   05/12/2022 0206 05/17/2022 2027 Full Code [id number]  Shela Leff, MD ED   08/12/2020 2302 08/14/2020 1547 Full Code [id number]  Jonnie Finner, DO ED   06/28/2020 1425 07/01/2020 1820 Full Code [id number]  Harold Hedge, MD ED   06/20/2020 2219 06/22/2020 1731 Full Code [id number]  Bethena Roys, MD Inpatient       Prognosis:  Hours -  Days  Discharge Planning: Anticipated Hospital Death  Care plan was discussed with  Ms Kellie Simmering on phone, call placed, unable to reach daughter Ms Maryjean Morn, discussed with various members of the interdisciplinary team  such as nursing colleagues, hospital medicine attending and palliative team.  Thank you for allowing the Palliative Medicine Team to assist in the care of this patient.   High MDM     Greater than 50%  of this time was spent counseling and coordinating care related to the above assessment and plan.  Loistine Chance, MD  Please contact Palliative Medicine Team phone at [phone] for questions and concerns.

## 2022-05-29 NOTE — Progress Notes (Signed)
Patient ID: Eddie Mendoza, male   DOB: December 17, 1950, 72 y.o.   MRN: [id number] Patient remains encephalopathic, tachypneic, temp 99.7, blood cultures negative to date, hepatic drain fluid cultures with staph and strep, output 70 cc.  WBC 20.5, hemoglobin 8.9, platelets 122k, creat 1.25, total bilirubin 14.8 down from 16.5, remaining LFTs slightly lower, drain output 70 cc bile, flushes ok.  Noted plan for transition to full comfort care. Case reviewed with Dr. Earleen Newport.

## 2022-05-29 NOTE — Progress Notes (Signed)
PROGRESS NOTE  Eddie Mendoza  [id number] DOB: 03-04-1951 DOA: 06/04/2022 PCP: [facility]   Brief Narrative: Patient is a 72 year old male with history of BPH status post TURP, hypertension, alcohol abuse, recent diagnosis of malignant biliary stricture , biopsy and biliary stent placement who presented with weakness, lethargy for evaluation as per suggestion his gastroenterologist.  Patient was recently hospitalized from 2/21 until 2/27 during which he underwent ERCP with stent placement in the pancreatic duct and common bile duct.  Patient had severely elevated  bilirubin concerning for pancreatic malignancy, CA19-9 was severely elavated.Pathology was not conclusive.  On presentation, imaging findings showed multiple hepatic abscesses.  Started on broad-spectrum antibiotics, underwent percutaneous drainage of liver abscess.  Hospital course remarkable for severe encephalopathy.  Palliative care consulted for goals of care, plan is to  transition to comfort.  Assessment & Plan:  Principal Problem:   Obstructive jaundice Active Problems:   Acute blood loss anemia   Biliary obstruction   Hepatic abscess   Acute metabolic encephalopathy   Elevated liver function tests   Biliary stricture   Paralytic ileus of large intestine (HCC)   Abnormal CT of the abdomen  Multiple liver abscesses/biliary collections, also jaundice with malignant stricture from suspected metastatic cancer, gram-positive bacteremia: Recent diagnosis of malignant biliary stricture on ERCP, biopsy and biliary stent placement.  Patient was recently hospitalized from 2/21 until 2/27 during which he underwent ERCP with stent placement in the pancreatic duct and common bile duct.  T. bili of 14.8. patient had severe elevated  bilirubin concerning for pancreatic biopsy, pathology was not conclusive. CA19-9 elevated. On presentation, imaging findings showed multiple hepatic abscesses.  Started on broad-spectrum  antibiotics, underwent percutaneous drainage of liver abscess.  Status post IR guided drainage Blood cultures showed mixed organisms strept  mitis/oralis, bacteroids fragilis.  Has leukocytosis with WBC count in the range of 20 K. ID was also consulted.Palliative care closely following with comfort care approach.  Acute on chronic anemia: Hemoglobin of 6.5 on presentation.  Received 3 units of PRBC.  Last hemoglobin stable.  Acute metabolic encephalopathy: Suspected to be from sepsis .CT head normal.  Chronic alcoholism.  Remains encephalopathic, lethargic  AKI: Given IV fluids.  Last creatinine 1.2  Severe protein calorie moderation: Albumin of less than 1.5.  Ileus: Distended abdomen, KUB with large amount of gas, no evidence of bowel obstruction.  GI was following.  Goals of care: Long discussion done again with his spouse on phone on 3/10.Marland Kitchen  His spouse and his daughter are agreeable for full comfort care.  They understand the prognosis. Palliative care also following        DVT prophylaxis:SCDs Start: 06/14/2022 0748     Code Status: DNR  Family Communication:: Discussed with spouse on phone on 3/10  Patient status: Inpatient  Patient is from : Home  Anticipated discharge to: Not sure  Estimated DC date:not sure   Consultants: GI, palliative care,ID  Procedures: Right liver abscess drain placement  Antimicrobials:  Anti-infectives (From admission, onward)    Start     Dose/Rate Route Frequency Ordered Stop   05/27/2022 1400  piperacillin-tazobactam (ZOSYN) IVPB 3.375 g        3.375 g 12.5 mL/hr over 240 Minutes Intravenous Every 8 hours 06/11/2022 0751     06/01/2022 0730  piperacillin-tazobactam (ZOSYN) IVPB 3.375 g        3.375 g 100 mL/hr over 30 Minutes Intravenous  Once 06/16/2022 0729 06/15/2022 2030  Subjective: Patient seen and examined the bedside today.   He is totally encephalopathic, eyes open but does not respond.  Does not speak, does not follow  command.  Appeared tachypneic.  Blood pressure stable  Objective: Vitals:   05/29/22 0408 05/29/22 0500 05/29/22 0600 05/29/22 0745  BP: (!) 135/58 (!) 122/49 (!) 146/48   Pulse:    85  Resp: (!) 23 (!) 26 (!) 23   Temp: 99.7 F (37.6 C)  99.7 F (37.6 C)   TempSrc: Axillary  Axillary   SpO2: 93%     Weight:      Height:        Intake/Output Summary (Last 24 hours) at 05/29/2022 0803 Last data filed at 05/29/2022 0600 Gross per 24 hour  Intake 3188.94 ml  Output 2290 ml  Net 898.94 ml   Filed Weights   06/06/2022 1100 05/27/22 0446  Weight: 89.2 kg 88.9 kg    Examination:  General exam: Lying in bed, mostly nonresponsive HEENT: Eyes open Respiratory system: Tachypnea, no frank wheezes or crackles  Cardiovascular system: S1 & S2 heard, RRR.  Gastrointestinal system: Abdomen is distended, soft and nontender.  Right upper quadrant drain Central nervous system: Not alert or oriented  extremities: No edema, no clubbing ,no cyanosis Skin: No rashes, no ulcers,no icterus     Data Reviewed: I have personally reviewed following labs and imaging studies  CBC: Recent Labs  Lab 05/30/2022 0508 06/15/2022 1625 05/30/2022 2247 05/27/22 0323 05/27/22 1426 05/27/22 1427 05/28/22 0733 05/29/22 0329  WBC 19.5* 21.3*  --  16.7*  --  19.3* 20.1* 20.5*  NEUTROABS 16.3* 18.2*  --   --   --  16.3* 17.2* 18.1*  HGB 6.5* 6.6*   < > 6.5* 9.7* 9.6* 10.5* 8.9*  HCT 19.3* 19.3*   < > 18.8* 27.7* 28.2* 31.3* 25.6*  MCV 87.7 86.2  --  87.9  --  84.7 88.2 86.5  PLT 106* 72*  --  65*  --  76* 69* 122*   < > = values in this interval not displayed.   Basic Metabolic Panel: Recent Labs  Lab 06/03/2022 1342 05/27/22 0323 05/27/22 1427 05/28/22 0733 05/29/22 0329  NA 137 133* 136 137 145  K 2.5* 3.1* 2.6* 3.6 3.9  CL 106 110 110 110 117*  CO2 18* 16* 17* 15* 16*  GLUCOSE 102* 125* 128* 104* 100*  BUN 77* 80* 78* 67* 55*  CREATININE 1.50* 1.64* 1.61* 1.04 1.25*  CALCIUM 7.4* 6.9* 7.4* 7.3*  7.6*  MG  --   --  3.5*  --   --   PHOS  --   --  4.5  --   --      Recent Results (from the past 240 hour(s))  Blood culture (routine x 2)     Status: Abnormal   Collection Time: 05/22/2022  5:20 AM   Specimen: BLOOD  Result Value Ref Range Status   Specimen Description   Final    BLOOD LEFT ANTECUBITAL Performed at [facility], [place] [address], [place], [place] [address]    Special Requests   Final    BOTTLES DRAWN AEROBIC AND ANAEROBIC Blood Culture results may not be optimal due to an excessive volume of blood received in culture bottles Performed at [facility] [address] [place], [place] [address]    Culture  Setup Time   Final    GRAM NEGATIVE RODS ANAEROBIC BOTTLE ONLY CRITICAL RESULT CALLED TO, READ  BACK BY AND VERIFIED WITH: PHARMD A. [facility] [id number] @ 20[address] [place] GRAM POSITIVE COCCI IN CHAINS BOTTLES DRAWN AEROBIC ONLY CRITICAL VALUE NOTED.  VALUE IS CONSISTENT WITH PREVIOUSLY REPORTED AND CALLED VALUE.    Culture (A)  Final    STREPTOCOCCUS MITIS/ORALIS BACTEROIDES FRAGILIS BETA LACTAMASE POSITIVE Performed at [facility], [place] [address] [place], [place] [address]    Report Status 05/28/2022 FINAL  Final   Organism ID, Bacteria STREPTOCOCCUS MITIS/ORALIS  Final      Susceptibility   Streptococcus mitis/oralis - MIC*    TETRACYCLINE >=16 RESISTANT Resistant     VANCOMYCIN 0.5 SENSITIVE Sensitive     CLINDAMYCIN >=1 RESISTANT Resistant     * STREPTOCOCCUS MITIS/ORALIS  Blood Culture ID Panel (Reflexed)     Status: Abnormal   Collection Time: 06/18/2022  5:20 AM  Result Value Ref Range Status   Enterococcus faecalis NOT DETECTED NOT DETECTED Final   Enterococcus Faecium NOT DETECTED NOT DETECTED Final   Listeria monocytogenes NOT DETECTED NOT DETECTED Final   Staphylococcus species NOT DETECTED NOT DETECTED Final   Staphylococcus aureus (BCID) NOT DETECTED NOT DETECTED Final   Staphylococcus epidermidis NOT  DETECTED NOT DETECTED Final   Staphylococcus lugdunensis NOT DETECTED NOT DETECTED Final   Streptococcus species DETECTED (A) NOT DETECTED Final    Comment: Not Enterococcus species, Streptococcus agalactiae, Streptococcus pyogenes, or Streptococcus pneumoniae. CRITICAL RESULT CALLED TO, READ BACK BY AND VERIFIED WITH: PHARMD A. Nelia Shi [id number] @ 2051 [facility]    Streptococcus agalactiae NOT DETECTED NOT DETECTED Final   Streptococcus pneumoniae NOT DETECTED NOT DETECTED Final   Streptococcus pyogenes NOT DETECTED NOT DETECTED Final   A.calcoaceticus-baumannii NOT DETECTED NOT DETECTED Final   Bacteroides fragilis DETECTED (A) NOT DETECTED Final    Comment: CRITICAL RESULT CALLED TO, READ BACK BY AND VERIFIED WITH: PHARMD A. [facility] [id number] @ 2051 [place]    Enterobacterales NOT DETECTED NOT DETECTED Final   Enterobacter cloacae complex NOT DETECTED NOT DETECTED Final   Escherichia coli NOT DETECTED NOT DETECTED Final   Klebsiella aerogenes NOT DETECTED NOT DETECTED Final   Klebsiella oxytoca NOT DETECTED NOT DETECTED Final   Klebsiella pneumoniae NOT DETECTED NOT DETECTED Final   Proteus species NOT DETECTED NOT DETECTED Final   Salmonella species NOT DETECTED NOT DETECTED Final   Serratia marcescens NOT DETECTED NOT DETECTED Final   Haemophilus influenzae NOT DETECTED NOT DETECTED Final   Neisseria meningitidis NOT DETECTED NOT DETECTED Final   Pseudomonas aeruginosa NOT DETECTED NOT DETECTED Final   Stenotrophomonas maltophilia NOT DETECTED NOT DETECTED Final   Candida albicans NOT DETECTED NOT DETECTED Final   Candida auris NOT DETECTED NOT DETECTED Final   Candida glabrata NOT DETECTED NOT DETECTED Final   Candida krusei NOT DETECTED NOT DETECTED Final   Candida parapsilosis NOT DETECTED NOT DETECTED Final   Candida tropicalis NOT DETECTED NOT DETECTED Final   Cryptococcus neoformans/gattii NOT DETECTED NOT DETECTED Final    Comment: Performed at [facility], [place] [address]  [place], [place] [address]  Blood culture (routine x 2)     Status: Abnormal   Collection Time: 06/10/2022  5:37 AM   Specimen: BLOOD LEFT FOREARM  Result Value Ref Range Status   Specimen Description BLOOD LEFT FOREARM  Final   Special Requests   Final    BOTTLES DRAWN AEROBIC AND ANAEROBIC Blood Culture adequate volume   Culture  Setup Time   Final    GRAM POSITIVE COCCI IN CHAINS IN BOTH AEROBIC  AND ANAEROBIC BOTTLES CRITICAL RESULT CALLED TO, READ BACK BY AND VERIFIED WITH: PHARMD A. Nelia Shi [id number] @ 2051 [facility]    Culture (A)  Final    STREPTOCOCCUS MITIS/ORALIS SUSCEPTIBILITIES PERFORMED ON PREVIOUS CULTURE WITHIN THE LAST 5 DAYS.    Report Status 05/28/2022 FINAL  Final  Blood Culture ID Panel (Reflexed)     Status: Abnormal   Collection Time: 06/04/2022  5:37 AM  Result Value Ref Range Status   Enterococcus faecalis NOT DETECTED NOT DETECTED Final   Enterococcus Faecium NOT DETECTED NOT DETECTED Final   Listeria monocytogenes NOT DETECTED NOT DETECTED Final   Staphylococcus species NOT DETECTED NOT DETECTED Final   Staphylococcus aureus (BCID) NOT DETECTED NOT DETECTED Final   Staphylococcus epidermidis NOT DETECTED NOT DETECTED Final   Staphylococcus lugdunensis NOT DETECTED NOT DETECTED Final   Streptococcus species DETECTED (A) NOT DETECTED Final    Comment: Not Enterococcus species, Streptococcus agalactiae, Streptococcus pyogenes, or Streptococcus pneumoniae. CRITICAL RESULT CALLED TO, READ BACK BY AND VERIFIED WITH: PHARMD A. Nelia Shi [id number] @ 2051 [facility]    Streptococcus agalactiae NOT DETECTED NOT DETECTED Final   Streptococcus pneumoniae NOT DETECTED NOT DETECTED Final   Streptococcus pyogenes NOT DETECTED NOT DETECTED Final   A.calcoaceticus-baumannii NOT DETECTED NOT DETECTED Final   Bacteroides fragilis DETECTED (A) NOT DETECTED Final    Comment: CRITICAL RESULT CALLED TO, READ BACK BY AND VERIFIED WITH: PHARMD A. [facility] [id number] @ 2051 [place]    Enterobacterales NOT DETECTED  NOT DETECTED Final   Enterobacter cloacae complex NOT DETECTED NOT DETECTED Final   Escherichia coli NOT DETECTED NOT DETECTED Final   Klebsiella aerogenes NOT DETECTED NOT DETECTED Final   Klebsiella oxytoca NOT DETECTED NOT DETECTED Final   Klebsiella pneumoniae NOT DETECTED NOT DETECTED Final   Proteus species NOT DETECTED NOT DETECTED Final   Salmonella species NOT DETECTED NOT DETECTED Final   Serratia marcescens NOT DETECTED NOT DETECTED Final   Haemophilus influenzae NOT DETECTED NOT DETECTED Final   Neisseria meningitidis NOT DETECTED NOT DETECTED Final   Pseudomonas aeruginosa NOT DETECTED NOT DETECTED Final   Stenotrophomonas maltophilia NOT DETECTED NOT DETECTED Final   Candida albicans NOT DETECTED NOT DETECTED Final   Candida auris NOT DETECTED NOT DETECTED Final   Candida glabrata NOT DETECTED NOT DETECTED Final   Candida krusei NOT DETECTED NOT DETECTED Final   Candida parapsilosis NOT DETECTED NOT DETECTED Final   Candida tropicalis NOT DETECTED NOT DETECTED Final   Cryptococcus neoformans/gattii NOT DETECTED NOT DETECTED Final    Comment: Performed at [facility], [place] [address] [place], [place] [address]  MRSA Next Gen by PCR, Nasal     Status: None   Collection Time: 06/08/2022  9:37 AM   Specimen: Nasal Mucosa; Nasal Swab  Result Value Ref Range Status   MRSA by PCR Next Gen NOT DETECTED NOT DETECTED Final    Comment: (NOTE) The GeneXpert MRSA Assay (FDA approved for NASAL specimens only), is one component of a comprehensive MRSA colonization surveillance program. It is not intended to diagnose MRSA infection nor to guide or monitor treatment for MRSA infections. Test performance is not FDA approved in patients less than 26 years old. Performed at [facility], [place] [address] [place],  [address]   Aerobic/Anaerobic Culture w Gram Stain (surgical/deep wound)     Status: None (Preliminary result)   Collection Time: 06/08/2022  1:00  PM   Specimen: Liver; Abscess  Result Value Ref Range Status   Specimen  Description   Final    LIVER Performed at [facility], [place] [address] [place], [place] [address]    Special Requests   Final    NONE Performed at [facility], [place] [address], [place], [place] [address]    Gram Stain   Final    NO WBC SEEN ABUNDANT GRAM NEGATIVE RODS ABUNDANT GRAM POSITIVE COCCI IN CHAINS    Culture   Final    ABUNDANT STREPTOCOCCUS PARASANGUINIS ABUNDANT STAPHYLOCOCCUS HAEMOLYTICUS SUSCEPTIBILITIES TO FOLLOW Performed at [facility], [place] [address] [place], [place] [address]    Report Status PENDING  Incomplete     Radiology Studies: ECHOCARDIOGRAM COMPLETE  Result Date: 05/28/2022    ECHOCARDIOGRAM REPORT   Patient Name:   Eddie Mendoza Date of Exam: 05/28/2022 Medical Rec #:  [id number]        Height:       72.0 in Accession #:    [id number]       Weight:       196.0 lb Date of Birth:  1951/02/07         BSA:          2.112 m Patient Age:    44 years         BP:           125/65 mmHg Patient Gender: M                HR:           79 bpm. Exam Location:  Inpatient Procedure: 2D Echo, Color Doppler and Cardiac Doppler Indications:    bacteremia  History:        Patient has no prior history of Echocardiogram examinations.                 Risk Factors:Hypertension and Current Smoker.  Sonographer:    Johny Chess RDCS Referring Phys: [id number] [facility]  Sonographer Comments: Image acquisition challenging due to uncooperative patient. IMPRESSIONS  1. LV false tendon noted in apex. Left ventricular ejection fraction, by estimation, is 55 to 60%. The left ventricle has normal function. The left ventricle has no regional wall motion abnormalities. Left ventricular diastolic parameters were normal.  2. Right ventricular systolic function is normal. The right ventricular size is normal. There is normal pulmonary artery systolic pressure.  3. The mitral  valve is grossly normal. No evidence of mitral valve regurgitation. No evidence of mitral stenosis.  4. The aortic valve was not well visualized. Aortic valve regurgitation is not visualized. No aortic stenosis is present.  5. The inferior vena cava is normal in size with greater than 50% respiratory variability, suggesting right atrial pressure of 3 mmHg. Comparison(s): No prior Echocardiogram. Conclusion(s)/Recommendation(s): No evidence of valvular vegetations on this transthoracic echocardiogram. Consider a transesophageal echocardiogram to exclude infective endocarditis if clinically indicated. FINDINGS  Left Ventricle: LV false tendon noted in apex. Left ventricular ejection fraction, by estimation, is 55 to 60%. The left ventricle has normal function. The left ventricle has no regional wall motion abnormalities. The left ventricular internal cavity size was normal in size. There is no left ventricular hypertrophy. Left ventricular diastolic parameters were normal. Right Ventricle: The right ventricular size is normal. No increase in right ventricular wall thickness. Right ventricular systolic function is normal. There is normal pulmonary artery systolic pressure. The tricuspid regurgitant velocity is 2.39 m/s, and  with an assumed right atrial pressure of 3 mmHg, the estimated right ventricular  systolic pressure is 0000000 mmHg. Left Atrium: Patent left atrial appendage. Left atrial size was normal in size. Right Atrium: Right atrial size was normal in size. Pericardium: There is no evidence of pericardial effusion. Mitral Valve: The mitral valve is grossly normal. No evidence of mitral valve regurgitation. No evidence of mitral valve stenosis. Tricuspid Valve: The tricuspid valve is normal in structure. Tricuspid valve regurgitation is not demonstrated. No evidence of tricuspid stenosis. Aortic Valve: The aortic valve was not well visualized. Aortic valve regurgitation is not visualized. No aortic stenosis is  present. Pulmonic Valve: The pulmonic valve was not well visualized. Pulmonic valve regurgitation is not visualized. Aorta: The aortic root and ascending aorta are structurally normal, with no evidence of dilitation. Venous: The inferior vena cava is normal in size with greater than 50% respiratory variability, suggesting right atrial pressure of 3 mmHg. IAS/Shunts: No atrial level shunt detected by color flow Doppler.  LEFT VENTRICLE PLAX 2D LVIDd:         5.20 cm   Diastology LVIDs:         3.80 cm   LV e' medial:    7.51 cm/s LV PW:         0.90 cm   LV E/e' medial:  10.7 LV IVS:        0.80 cm   LV e' lateral:   10.30 cm/s LVOT diam:     2.30 cm   LV E/e' lateral: 7.8 LV SV:         93 LV SV Index:   44 LVOT Area:     4.15 cm  RIGHT VENTRICLE             IVC RV Basal diam:  2.90 cm     IVC diam: 1.40 cm RV S prime:     16.20 cm/s TAPSE (M-mode): 2.7 cm LEFT ATRIUM             Index        RIGHT ATRIUM           Index LA diam:        3.90 cm 1.85 cm/m   RA Area:     14.10 cm LA Vol (A2C):   58.9 ml 27.88 ml/m  RA Volume:   30.00 ml  14.20 ml/m LA Vol (A4C):   65.5 ml 31.01 ml/m LA Biplane Vol: 66.0 ml 31.25 ml/m  AORTIC VALVE LVOT Vmax:   120.00 cm/s LVOT Vmean:  76.500 cm/s LVOT VTI:    0.223 m  AORTA Ao Root diam: 3.40 cm Ao Asc diam:  3.10 cm MITRAL VALVE                TRICUSPID VALVE MV Area (PHT): 4.21 cm     TR Peak grad:   22.8 mmHg MV Decel Time: 180 msec     TR Vmax:        239.00 cm/s MV E velocity: 80.20 cm/s MV A velocity: 118.00 cm/s  SHUNTS MV E/A ratio:  0.68         Systemic VTI:  0.22 m                             Systemic Diam: 2.30 cm Rudean Haskell MD Electronically signed by Rudean Haskell MD Signature Date/Time: 05/28/2022/2:35:38 PM    Final    DG Abd 1 View  Result Date: 05/28/2022 CLINICAL DATA:  Abdominal distension EXAM: ABDOMEN - 1  VIEW COMPARISON:  05/31/2022 FINDINGS: Two supine frontal views of the abdomen and pelvis are obtained. The biliary and pancreatic  stents seen previously are again identified. Pigtail drainage catheter overlies the liver. No evidence of bowel obstruction. Moderate gaseous distention of the colon could reflect ileus. There are no masses or abnormal calcifications. Lung bases are clear. IMPRESSION: 1. Support devices as above. 2. Moderate gaseous distention of the colon which could reflect colonic ileus. No evidence of bowel obstruction. Electronically Signed   By: Randa Ngo M.D.   On: 05/28/2022 09:29    Scheduled Meds:  Chlorhexidine Gluconate Cloth  6 each Topical Daily   famotidine  40 mg Oral Daily   finasteride  5 mg Oral Daily   folic acid  1 mg Oral Daily   metoCLOPramide (REGLAN) injection  10 mg Intravenous Q6H   multivitamin with minerals  1 tablet Oral Daily   pantoprazole (PROTONIX) IV  40 mg Intravenous Q12H   sodium bicarbonate  650 mg Oral BID   sodium chloride flush  3 mL Intravenous Q12H   sodium chloride flush  5 mL Intracatheter Q8H   thiamine  100 mg Oral Daily   Or   thiamine  100 mg Intravenous Daily   Continuous Infusions:  lactated ringers 1,000 mL with potassium chloride 40 mEq infusion 125 mL/hr at 05/29/22 0543   piperacillin-tazobactam (ZOSYN)  IV 3.375 g (05/29/22 0532)     LOS: 3 days   Shelly Coss, MD [facility] P3/12/2022, 8:03 AM

## 2022-05-30 DIAGNOSIS — K831 Obstruction of bile duct: Secondary | ICD-10-CM | POA: Diagnosis not present

## 2022-05-30 LAB — AEROBIC/ANAEROBIC CULTURE W GRAM STAIN (SURGICAL/DEEP WOUND): Gram Stain: NONE SEEN

## 2022-05-30 NOTE — Progress Notes (Signed)
Daily Progress Note   Patient Name: Eddie Mendoza       Date: 05/30/2022 DOB: 03-18-1951  Age: 72 y.o. MRN#: [id number] Attending Physician: Shelly Coss, MD Primary Care Physician: [facility] Admit Date: 05/24/2022  Reason for Consultation/Follow-up: Terminal Care  Subjective:  Patient is unresponsive, but appears comfortable, several family members in the room, holding vigil. Medication history noted, comfortable on current hydromorphone infusion settings.   Length of Stay: 4  Current Medications: Scheduled Meds:   glycopyrrolate  0.1 mg Intravenous TID   sodium chloride flush  3 mL Intravenous Q12H   sodium chloride flush  5 mL Intracatheter Q8H    Continuous Infusions:  HYDROmorphone 1 mg/hr (05/30/22 1106)   lactated ringers 1,000 mL with potassium chloride 40 mEq infusion Stopped (05/29/22 1301)    PRN Meds: HYDROmorphone **AND** HYDROmorphone, hydrOXYzine, lip balm, LORazepam **OR** LORazepam, ondansetron **OR** ondansetron (ZOFRAN) IV, mouth rinse  Physical Exam         He is unresponsive Chronically ill-appearing Monitor noted Has percutaneous drain Has edema Unresponsive  Vital Signs: BP (!) 96/32   Pulse 88   Temp 100.2 F (37.9 C) (Oral)   Resp (!) 5   Ht 6' (1.829 m)   Wt 88.7 kg   SpO2 90%   BMI 26.52 kg/m  SpO2: SpO2: 90 % O2 Device: O2 Device: Nasal Cannula O2 Flow Rate: O2 Flow Rate (L/min): 2 L/min  Intake/output summary:  Intake/Output Summary (Last 24 hours) at 05/30/2022 1149 Last data filed at 05/30/2022 [id number] Gross per 24 hour  Intake 346.72 ml  Output 120 ml  Net 226.72 ml    LBM: Last BM Date : 05/28/22 Baseline Weight: Weight: 89.2 kg Most recent weight: Weight: 88.7 kg       Palliative  Assessment/Data:      Patient Active Problem List   Diagnosis Date Noted   Paralytic ileus of large intestine (HCC) 05/28/2022   Abnormal CT of the abdomen 05/28/2022   Elevated liver function tests 05/27/2022   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   Hepatic abscess Q000111Q   Acute metabolic encephalopathy Q000111Q   Transaminitis 05/17/2022   Anemia 05/16/2022   Leucocytosis 05/15/2022   Biliary obstruction 05/15/2022   Elevated liver enzymes 05/12/2022   Hyponatremia 05/12/2022   Normal anion gap metabolic acidosis [id number]  Alcohol use disorder 05/12/2022   Pancreatic mass 05/12/2022   Bladder mass 05/12/2022   Enlarged prostate 05/12/2022   Obstructive jaundice 05/12/2022   Abnormal CT scan, gallbladder 08/13/2020   Gross hematuria 08/12/2020     06/28/2020   Acute blood loss anemia 06/28/2020   COVID-19 virus infection 06/28/2020   Hematuria 06/20/2020   Essential hypertension 06/20/2020    Palliative Care Assessment & Plan   Patient Profile:  72 y.o. 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 He was admitted on 06/06/2022 from home with generalized weakness.    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  Anticipate hospital death, not safe for transfer to residential hospice.   Palliative performance scale 20%.  Goals of Care and Additional Recommendations: Limitations on Scope of  Treatment: Full Comfort Care  Code Status:    Code Status Orders  (From admission, onward)           Start     Ordered   05/28/22 1421  Do not attempt resuscitation (DNR)  Continuous       Question Answer Comment  If patient has no pulse and is not breathing Do Not Attempt Resuscitation   If patient has a pulse and/or is breathing: Medical Treatment Goals LIMITED ADDITIONAL INTERVENTIONS: Use medication/IV fluids and cardiac monitoring as indicated; Do not use intubation or mechanical ventilation (DNI), also provide comfort medications.  Transfer to Progressive/Stepdown as indicated, avoid Intensive Care.   Consent: Discussion documented in EHR or advanced directives reviewed      05/28/22 1421           Code Status History     Date Active Date Inactive Code Status Order ID Comments User Context   05/28/2022 0749 05/28/2022 1421 Full Code [id number]  Samella Parr, NP ED   05/12/2022 0206 05/17/2022 2027 Full Code [id number]  Shela Leff, MD ED   08/12/2020 2302 08/14/2020 1547 Full Code [id number]  Jonnie Finner, DO ED   06/28/2020 1425 07/01/2020 1820 Full Code [id number]  Harold Hedge, MD ED   06/20/2020 2219 06/22/2020 1731 Full Code [id number]  Bethena Roys, MD Inpatient       Prognosis:  Hours - Days  Discharge Planning: Anticipated Hospital Death  Care plan was discussed with  IDT  Thank you for allowing the Palliative Medicine Team to assist in the care of this patient.   Low MDM     Greater than 50%  of this time was spent counseling and coordinating care related to the above assessment and plan.  Loistine Chance, MD  Please contact Palliative Medicine Team phone at [phone] for questions and concerns.

## 2022-05-30 NOTE — Progress Notes (Signed)
PROGRESS NOTE  Eddie Mendoza  [id number] DOB: [birth year] DOA: 05/23/2022 PCP: [facility]   Brief Narrative: Patient is a 72 year old male with history of BPH status post TURP, hypertension, alcohol abuse, recent diagnosis of malignant biliary stricture , biopsy and biliary stent placement who presented with weakness, lethargy for evaluation as per suggestion his gastroenterologist.  Patient was recently hospitalized from 2/21 until 2/27 during which he underwent ERCP with stent placement in the pancreatic duct and common bile duct.  Patient had severely elevated  bilirubin concerning for pancreatic malignancy, CA19-9 was severely elavated.Pathology was not conclusive.  On presentation, imaging findings showed multiple hepatic abscesses.  Started on broad-spectrum antibiotics, underwent percutaneous drainage of liver abscess.  Hospital course remarkable for severe encephalopathy.  Palliative care consulted for goals of care, care transitioned to comfort.Anticipated hospital death  Assessment & Plan:  Principal Problem:   Obstructive jaundice Active Problems:   Acute blood loss anemia   Biliary obstruction   Hepatic abscess   Acute metabolic encephalopathy   Elevated liver function tests   Biliary stricture   Paralytic ileus of large intestine (HCC)   Abnormal CT of the abdomen  Multiple liver abscesses/biliary collections, also jaundice with malignant stricture from suspected metastatic cancer, gram-positive bacteremia: Recent diagnosis of malignant biliary stricture on ERCP, biopsy and biliary stent placement.  Patient was recently hospitalized from 2/21 until 2/27 during which he underwent ERCP with stent placement in the pancreatic duct and common bile duct.  T. bili of 14.8. patient had severe elevated  bilirubin concerning for pancreatic biopsy, pathology was not conclusive. CA19-9 elevated. On presentation, imaging findings showed multiple hepatic abscesses.  Started on  broad-spectrum antibiotics, underwent percutaneous drainage of liver abscess.  Status post IR guided drainage Blood cultures showed mixed organisms strept  mitis/oralis, bacteroids fragilis.  Had leukocytosis with WBC count in the range of 20 K. ID was also consulted.Palliative care  following with comfort care approach.  Acute on chronic anemia: Hemoglobin of 6.5 on presentation.  Received 3 units of PRBC.  Last hemoglobin stable.  Acute metabolic encephalopathy: Suspected to be from sepsis .CT head normal.  Chronic alcoholism.  Remains unresponisve  AKI: Given IV fluids.  Last creatinine 1.2  Severe protein calorie moderation: Albumin of less than 1.5.  Ileus: Distended abdomen, KUB with large amount of gas, no evidence of bowel obstruction.  GI was following.  Goals of care: Long discussion done with his spouse on phone on 3/10.Marland Kitchen  His spouse and his daughter were  agreeable for full comfort care.  They understand the prognosis.Anticipated hospital death        DVT prophylaxis:SCDs Start: 05/29/2022 0748     Code Status: DNR  Family Communication:: Discussed with friends and family on 3/11 who were around him  Patient status: Inpatient  Patient is from : Home  Anticipated discharge to: anticipated hospital death     Consultants: GI, palliative care,ID  Procedures: Right liver abscess drain placement  Antimicrobials:  Anti-infectives (From admission, onward)    Start     Dose/Rate Route Frequency Ordered Stop   06/11/2022 1400  piperacillin-tazobactam (ZOSYN) IVPB 3.375 g  Status:  Discontinued        3.375 g 12.5 mL/hr over 240 Minutes Intravenous Every 8 hours 05/21/2022 0751 05/29/22 1259   05/20/2022 0730  piperacillin-tazobactam (ZOSYN) IVPB 3.375 g        3.375 g 100 mL/hr over 30 Minutes Intravenous  Once 06/06/2022 0729 05/23/2022 2030  Subjective: Patient seen and examined at bedside this afternoon.  On full comfort care.  Remains unresponsive.  Eyes closed.   Family/friends on bedside  Objective: Vitals:   05/30/22 0500 05/30/22 1200 05/30/22 1226 05/30/22 1300  BP:  (!) 102/32    Pulse:      Resp:  (!) 7 (!) 5 (!) 6  Temp:   100 F (37.8 C)   TempSrc:   Axillary   SpO2:    (!) 89%  Weight: 88.7 kg     Height:        Intake/Output Summary (Last 24 hours) at 05/30/2022 1419 Last data filed at 05/30/2022 1300 Gross per 24 hour  Intake 346.72 ml  Output 270 ml  Net 76.72 ml   Filed Weights   06/10/2022 1100 05/27/22 0446 05/30/22 0500  Weight: 89.2 kg 88.9 kg 88.7 kg    Examination:  General exam: Lying on bed, appears comfortable, shallow breathing pattern, not in distress, unresponsive  Data Reviewed: I have personally reviewed following labs and imaging studies  CBC: Recent Labs  Lab 05/28/2022 0508 06/13/2022 1625 06/01/2022 2247 05/27/22 0323 05/27/22 1426 05/27/22 1427 05/28/22 0733 05/29/22 0329  WBC 19.5* 21.3*  --  16.7*  --  19.3* 20.1* 20.5*  NEUTROABS 16.3* 18.2*  --   --   --  16.3* 17.2* 18.1*  HGB 6.5* 6.6*   < > 6.5* 9.7* 9.6* 10.5* 8.9*  HCT 19.3* 19.3*   < > 18.8* 27.7* 28.2* 31.3* 25.6*  MCV 87.7 86.2  --  87.9  --  84.7 88.2 86.5  PLT 106* 72*  --  65*  --  76* 69* 122*   < > = values in this interval not displayed.   Basic Metabolic Panel: Recent Labs  Lab 05/22/2022 1342 05/27/22 0323 05/27/22 1427 05/28/22 0733 05/29/22 0329  NA 137 133* 136 137 145  K 2.5* 3.1* 2.6* 3.6 3.9  CL 106 110 110 110 117*  CO2 18* 16* 17* 15* 16*  GLUCOSE 102* 125* 128* 104* 100*  BUN 77* 80* 78* 67* 55*  CREATININE 1.50* 1.64* 1.61* 1.04 1.25*  CALCIUM 7.4* 6.9* 7.4* 7.3* 7.6*  MG  --   --  3.5*  --   --   PHOS  --   --  4.5  --   --      Recent Results (from the past 240 hour(s))  Blood culture (routine x 2)     Status: Abnormal   Collection Time: 06/03/2022  5:20 AM   Specimen: BLOOD  Result Value Ref Range Status   Specimen Description   Final    BLOOD LEFT ANTECUBITAL Performed at [facility], [place] [address], [place], [place] [address]    Special Requests   Final    BOTTLES DRAWN AEROBIC AND ANAEROBIC Blood Culture results may not be optimal due to an excessive volume of blood received in culture bottles Performed at [facility] [address] [place], [place] [address]    Culture  Setup Time   Final    GRAM NEGATIVE RODS ANAEROBIC BOTTLE ONLY CRITICAL RESULT CALLED TO, READ BACK BY AND VERIFIED WITH: PHARMD A. [facility] [id number] @ [address] [place] GRAM POSITIVE COCCI IN CHAINS BOTTLES DRAWN AEROBIC ONLY CRITICAL VALUE NOTED.  VALUE IS CONSISTENT WITH PREVIOUSLY REPORTED AND CALLED VALUE.    Culture (A)  Final    STREPTOCOCCUS MITIS/ORALIS BACTEROIDES FRAGILIS BETA LACTAMASE POSITIVE Performed at [facility], [place]  [address], [place], [place] [address]    Report Status 05/28/2022 FINAL  Final   Organism ID, Bacteria STREPTOCOCCUS MITIS/ORALIS  Final      Susceptibility   Streptococcus mitis/oralis - MIC*    TETRACYCLINE >=16 RESISTANT Resistant     VANCOMYCIN 0.5 SENSITIVE Sensitive     CLINDAMYCIN >=1 RESISTANT Resistant     * STREPTOCOCCUS MITIS/ORALIS  Blood Culture ID Panel (Reflexed)     Status: Abnormal   Collection Time: 06/08/2022  5:20 AM  Result Value Ref Range Status   Enterococcus faecalis NOT DETECTED NOT DETECTED Final   Enterococcus Faecium NOT DETECTED NOT DETECTED Final   Listeria monocytogenes NOT DETECTED NOT DETECTED Final   Staphylococcus species NOT DETECTED NOT DETECTED Final   Staphylococcus aureus (BCID) NOT DETECTED NOT DETECTED Final   Staphylococcus epidermidis NOT DETECTED NOT DETECTED Final   Staphylococcus lugdunensis NOT DETECTED NOT DETECTED Final   Streptococcus species DETECTED (A) NOT DETECTED Final    Comment: Not Enterococcus species, Streptococcus agalactiae, Streptococcus pyogenes, or Streptococcus pneumoniae. CRITICAL RESULT CALLED TO, READ BACK BY AND VERIFIED WITH: PHARMD A. Nelia Shi [id number] @ 2051  FH    Streptococcus agalactiae NOT DETECTED NOT DETECTED Final   Streptococcus pneumoniae NOT DETECTED NOT DETECTED Final   Streptococcus pyogenes NOT DETECTED NOT DETECTED Final   A.calcoaceticus-baumannii NOT DETECTED NOT DETECTED Final   Bacteroides fragilis DETECTED (A) NOT DETECTED Final    Comment: CRITICAL RESULT CALLED TO, READ BACK BY AND VERIFIED WITH: PHARMD A. [facility] [id number] @ [address] [place]    Enterobacterales NOT DETECTED NOT DETECTED Final   Enterobacter cloacae complex NOT DETECTED NOT DETECTED Final   Escherichia coli NOT DETECTED NOT DETECTED Final   Klebsiella aerogenes NOT DETECTED NOT DETECTED Final   Klebsiella oxytoca NOT DETECTED NOT DETECTED Final   Klebsiella pneumoniae NOT DETECTED NOT DETECTED Final   Proteus species NOT DETECTED NOT DETECTED Final   Salmonella species NOT DETECTED NOT DETECTED Final   Serratia marcescens NOT DETECTED NOT DETECTED Final   Haemophilus influenzae NOT DETECTED NOT DETECTED Final   Neisseria meningitidis NOT DETECTED NOT DETECTED Final   Pseudomonas aeruginosa NOT DETECTED NOT DETECTED Final   Stenotrophomonas maltophilia NOT DETECTED NOT DETECTED Final   Candida albicans NOT DETECTED NOT DETECTED Final   Candida auris NOT DETECTED NOT DETECTED Final   Candida glabrata NOT DETECTED NOT DETECTED Final   Candida krusei NOT DETECTED NOT DETECTED Final   Candida parapsilosis NOT DETECTED NOT DETECTED Final   Candida tropicalis NOT DETECTED NOT DETECTED Final   Cryptococcus neoformans/gattii NOT DETECTED NOT DETECTED Final    Comment: Performed at [facility], [place] [address], [place], [place] [address]  Blood culture (routine x 2)     Status: Abnormal   Collection Time: 06/16/2022  5:37 AM   Specimen: BLOOD LEFT FOREARM  Result Value Ref Range Status   Specimen Description BLOOD LEFT FOREARM  Final   Special Requests   Final    BOTTLES DRAWN AEROBIC AND ANAEROBIC Blood Culture adequate volume   Culture  Setup Time   Final     GRAM POSITIVE COCCI IN CHAINS IN BOTH AEROBIC AND ANAEROBIC BOTTLES CRITICAL RESULT CALLED TO, READ BACK BY AND VERIFIED WITH: PHARMD A. Nelia Shi [id number] @ 2051 [facility]    Culture (A)  Final    STREPTOCOCCUS MITIS/ORALIS SUSCEPTIBILITIES PERFORMED ON PREVIOUS CULTURE WITHIN THE LAST 5 DAYS.    Report Status 05/28/2022 FINAL  Final  Blood Culture ID Panel (Reflexed)  Status: Abnormal   Collection Time: 06/03/2022  5:37 AM  Result Value Ref Range Status   Enterococcus faecalis NOT DETECTED NOT DETECTED Final   Enterococcus Faecium NOT DETECTED NOT DETECTED Final   Listeria monocytogenes NOT DETECTED NOT DETECTED Final   Staphylococcus species NOT DETECTED NOT DETECTED Final   Staphylococcus aureus (BCID) NOT DETECTED NOT DETECTED Final   Staphylococcus epidermidis NOT DETECTED NOT DETECTED Final   Staphylococcus lugdunensis NOT DETECTED NOT DETECTED Final   Streptococcus species DETECTED (A) NOT DETECTED Final    Comment: Not Enterococcus species, Streptococcus agalactiae, Streptococcus pyogenes, or Streptococcus pneumoniae. CRITICAL RESULT CALLED TO, READ BACK BY AND VERIFIED WITH: PHARMD A. Nelia Shi [id number] @ 2051 FH    Streptococcus agalactiae NOT DETECTED NOT DETECTED Final   Streptococcus pneumoniae NOT DETECTED NOT DETECTED Final   Streptococcus pyogenes NOT DETECTED NOT DETECTED Final   A.calcoaceticus-baumannii NOT DETECTED NOT DETECTED Final   Bacteroides fragilis DETECTED (A) NOT DETECTED Final    Comment: CRITICAL RESULT CALLED TO, READ BACK BY AND VERIFIED WITH: PHARMD A. [facility] [id number] @ [address] [place]    Enterobacterales NOT DETECTED NOT DETECTED Final   Enterobacter cloacae complex NOT DETECTED NOT DETECTED Final   Escherichia coli NOT DETECTED NOT DETECTED Final   Klebsiella aerogenes NOT DETECTED NOT DETECTED Final   Klebsiella oxytoca NOT DETECTED NOT DETECTED Final   Klebsiella pneumoniae NOT DETECTED NOT DETECTED Final   Proteus species NOT DETECTED NOT DETECTED Final    Salmonella species NOT DETECTED NOT DETECTED Final   Serratia marcescens NOT DETECTED NOT DETECTED Final   Haemophilus influenzae NOT DETECTED NOT DETECTED Final   Neisseria meningitidis NOT DETECTED NOT DETECTED Final   Pseudomonas aeruginosa NOT DETECTED NOT DETECTED Final   Stenotrophomonas maltophilia NOT DETECTED NOT DETECTED Final   Candida albicans NOT DETECTED NOT DETECTED Final   Candida auris NOT DETECTED NOT DETECTED Final   Candida glabrata NOT DETECTED NOT DETECTED Final   Candida krusei NOT DETECTED NOT DETECTED Final   Candida parapsilosis NOT DETECTED NOT DETECTED Final   Candida tropicalis NOT DETECTED NOT DETECTED Final   Cryptococcus neoformans/gattii NOT DETECTED NOT DETECTED Final    Comment: Performed at [facility], [place] [address] [place], [place] [address]  MRSA Next Gen by PCR, Nasal     Status: None   Collection Time: 06/12/2022  9:37 AM   Specimen: Nasal Mucosa; Nasal Swab  Result Value Ref Range Status   MRSA by PCR Next Gen NOT DETECTED NOT DETECTED Final    Comment: (NOTE) The GeneXpert MRSA Assay (FDA approved for NASAL specimens only), is one component of a comprehensive MRSA colonization surveillance program. It is not intended to diagnose MRSA infection nor to guide or monitor treatment for MRSA infections. Test performance is not FDA approved in patients less than 67 years old. Performed at [facility], [place] [address] [place], [place] [address]   Aerobic/Anaerobic Culture w Gram Stain (surgical/deep wound)     Status: None   Collection Time: 05/31/2022  1:00 PM   Specimen: Liver; Abscess  Result Value Ref Range Status   Specimen Description   Final    LIVER Performed at [facility] [address] [place], [place] [address]    Special Requests   Final    NONE Performed at [facility], [place] [address], [place], [place] [address]    Gram Stain   Final    NO WBC SEEN ABUNDANT  GRAM NEGATIVE  RODS ABUNDANT GRAM POSITIVE COCCI IN CHAINS    Culture   Final    ABUNDANT STREPTOCOCCUS PARASANGUINIS ABUNDANT STAPHYLOCOCCUS HAEMOLYTICUS ABUNDANT BACTEROIDES FRAGILIS BETA LACTAMASE NEGATIVE Performed at [facility], [place] [address], [place], [place] [address]    Report Status 05/30/2022 FINAL  Final   Organism ID, Bacteria STAPHYLOCOCCUS HAEMOLYTICUS  Final   Organism ID, Bacteria STREPTOCOCCUS PARASANGUINIS  Final      Susceptibility   Streptococcus parasanguinis - MIC*    PENICILLIN 0.12 SENSITIVE Sensitive     CEFTRIAXONE <=0.12 SENSITIVE Sensitive     ERYTHROMYCIN >=8 RESISTANT Resistant     LEVOFLOXACIN 8 RESISTANT Resistant     VANCOMYCIN 0.5 SENSITIVE Sensitive     * ABUNDANT STREPTOCOCCUS PARASANGUINIS   Staphylococcus haemolyticus - MIC*    CIPROFLOXACIN >=8 RESISTANT Resistant     ERYTHROMYCIN >=8 RESISTANT Resistant     GENTAMICIN 2 SENSITIVE Sensitive     OXACILLIN >=4 RESISTANT Resistant     TETRACYCLINE <=1 SENSITIVE Sensitive     VANCOMYCIN <=0.5 SENSITIVE Sensitive     TRIMETH/SULFA >=320 RESISTANT Resistant     CLINDAMYCIN RESISTANT Resistant     RIFAMPIN <=0.5 SENSITIVE Sensitive     Inducible Clindamycin POSITIVE Resistant     * ABUNDANT STAPHYLOCOCCUS HAEMOLYTICUS  Culture, blood (Routine X 2) w Reflex to ID Panel     Status: None (Preliminary result)   Collection Time: 05/28/22  7:55 AM   Specimen: BLOOD RIGHT ARM  Result Value Ref Range Status   Specimen Description   Final    BLOOD RIGHT ARM Performed at [facility] [address] [place], [place] [address]    Special Requests   Final    BOTTLES DRAWN AEROBIC ONLY Blood Culture results may not be optimal due to an inadequate volume of blood received in culture bottles Performed at [facility] [address] [place], [place] [address]    Culture   Final    NO GROWTH < 24 HOURS Performed at [facility] [address],  [place], [place] [address]    Report Status PENDING  Incomplete  Culture, blood (Routine X 2) w Reflex to ID Panel     Status: None (Preliminary result)   Collection Time: 05/28/22  7:56 AM   Specimen: BLOOD RIGHT HAND  Result Value Ref Range Status   Specimen Description   Final    BLOOD RIGHT HAND Performed at [facility] [address] [place], [place] [address]    Special Requests   Final    BOTTLES DRAWN AEROBIC ONLY Blood Culture results may not be optimal due to an inadequate volume of blood received in culture bottles Performed at [facility] [address] [place], [place] [address]    Culture   Final    NO GROWTH < 24 HOURS Performed at [facility] [address], [place], [place] [address]    Report Status PENDING  Incomplete     Radiology Studies: DG Abd 1 View  Result Date: 05/29/2022 CLINICAL DATA:  72 year old male with history of abdominal distension. EXAM: ABDOMEN - 1 VIEW COMPARISON:  Abdominal radiograph 05/28/2022. FINDINGS: Pigtail drainage catheter projecting over the right upper quadrant of the abdomen again noted. Biliary drainage catheter in the expected location of the common bile duct projecting over the right upper quadrant of the abdomen. Some gas is noted within multiple nondilated loops of small bowel. No pathologic dilatation of  small bowel. Gas and stool noted throughout the colon and rectum. No pneumoperitoneum. IMPRESSION: 1. Support apparatus, as above. 2. Nonspecific, nonobstructive bowel gas pattern. Electronically Signed   By: Vinnie Langton M.D.   On: 05/29/2022 10:05   ECHOCARDIOGRAM COMPLETE  Result Date: 05/28/2022    ECHOCARDIOGRAM REPORT   Patient Name:   KAVEN DELTORO Date of Exam: 05/28/2022 Medical Rec #:  [id number]        Height:       72.0 in Accession #:    [id number]       Weight:       196.0 lb Date of Birth:  09/23/1950         BSA:          2.112 m Patient Age:    41 years         BP:            125/65 mmHg Patient Gender: M                HR:           79 bpm. Exam Location:  Inpatient Procedure: 2D Echo, Color Doppler and Cardiac Doppler Indications:    bacteremia  History:        Patient has no prior history of Echocardiogram examinations.                 Risk Factors:Hypertension and Current Smoker.  Sonographer:    Johny Chess RDCS Referring Phys: [id number] [facility]  Sonographer Comments: Image acquisition challenging due to uncooperative patient. IMPRESSIONS  1. LV false tendon noted in apex. Left ventricular ejection fraction, by estimation, is 55 to 60%. The left ventricle has normal function. The left ventricle has no regional wall motion abnormalities. Left ventricular diastolic parameters were normal.  2. Right ventricular systolic function is normal. The right ventricular size is normal. There is normal pulmonary artery systolic pressure.  3. The mitral valve is grossly normal. No evidence of mitral valve regurgitation. No evidence of mitral stenosis.  4. The aortic valve was not well visualized. Aortic valve regurgitation is not visualized. No aortic stenosis is present.  5. The inferior vena cava is normal in size with greater than 50% respiratory variability, suggesting right atrial pressure of 3 mmHg. Comparison(s): No prior Echocardiogram. Conclusion(s)/Recommendation(s): No evidence of valvular vegetations on this transthoracic echocardiogram. Consider a transesophageal echocardiogram to exclude infective endocarditis if clinically indicated. FINDINGS  Left Ventricle: LV false tendon noted in apex. Left ventricular ejection fraction, by estimation, is 55 to 60%. The left ventricle has normal function. The left ventricle has no regional wall motion abnormalities. The left ventricular internal cavity size was normal in size. There is no left ventricular hypertrophy. Left ventricular diastolic parameters were normal. Right Ventricle: The right ventricular size is normal. No  increase in right ventricular wall thickness. Right ventricular systolic function is normal. There is normal pulmonary artery systolic pressure. The tricuspid regurgitant velocity is 2.39 m/s, and  with an assumed right atrial pressure of 3 mmHg, the estimated right ventricular systolic pressure is 0000000 mmHg. Left Atrium: Patent left atrial appendage. Left atrial size was normal in size. Right Atrium: Right atrial size was normal in size. Pericardium: There is no evidence of pericardial effusion. Mitral Valve: The mitral valve is grossly normal. No evidence of mitral valve regurgitation. No evidence of mitral valve stenosis. Tricuspid Valve: The tricuspid valve is normal in structure. Tricuspid valve regurgitation is not demonstrated. No evidence of tricuspid  stenosis. Aortic Valve: The aortic valve was not well visualized. Aortic valve regurgitation is not visualized. No aortic stenosis is present. Pulmonic Valve: The pulmonic valve was not well visualized. Pulmonic valve regurgitation is not visualized. Aorta: The aortic root and ascending aorta are structurally normal, with no evidence of dilitation. Venous: The inferior vena cava is normal in size with greater than 50% respiratory variability, suggesting right atrial pressure of 3 mmHg. IAS/Shunts: No atrial level shunt detected by color flow Doppler.  LEFT VENTRICLE PLAX 2D LVIDd:         5.20 cm   Diastology LVIDs:         3.80 cm   LV e' medial:    7.51 cm/s LV PW:         0.90 cm   LV E/e' medial:  10.7 LV IVS:        0.80 cm   LV e' lateral:   10.30 cm/s LVOT diam:     2.30 cm   LV E/e' lateral: 7.8 LV SV:         93 LV SV Index:   44 LVOT Area:     4.15 cm  RIGHT VENTRICLE             IVC RV Basal diam:  2.90 cm     IVC diam: 1.40 cm RV S prime:     16.20 cm/s TAPSE (M-mode): 2.7 cm LEFT ATRIUM             Index        RIGHT ATRIUM           Index LA diam:        3.90 cm 1.85 cm/m   RA Area:     14.10 cm LA Vol (A2C):   58.9 ml 27.88 ml/m  RA Volume:    30.00 ml  14.20 ml/m LA Vol (A4C):   65.5 ml 31.01 ml/m LA Biplane Vol: 66.0 ml 31.25 ml/m  AORTIC VALVE LVOT Vmax:   120.00 cm/s LVOT Vmean:  76.500 cm/s LVOT VTI:    0.223 m  AORTA Ao Root diam: 3.40 cm Ao Asc diam:  3.10 cm MITRAL VALVE                TRICUSPID VALVE MV Area (PHT): 4.21 cm     TR Peak grad:   22.8 mmHg MV Decel Time: 180 msec     TR Vmax:        239.00 cm/s MV E velocity: 80.20 cm/s MV A velocity: 118.00 cm/s  SHUNTS MV E/A ratio:  0.68         Systemic VTI:  0.22 m                             Systemic Diam: 2.30 cm Rudean Haskell MD Electronically signed by Rudean Haskell MD Signature Date/Time: 05/28/2022/2:35:38 PM    Final     Scheduled Meds:  glycopyrrolate  0.1 mg Intravenous TID   sodium chloride flush  3 mL Intravenous Q12H   sodium chloride flush  5 mL Intracatheter Q8H   Continuous Infusions:  HYDROmorphone 1 mg/hr (05/30/22 1106)   lactated ringers 1,000 mL with potassium chloride 40 mEq infusion Stopped (05/29/22 1301)     LOS: 4 days   Shelly Coss, MD [facility] P3/01/2023, 2:19 PM

## 2022-05-30 NOTE — Progress Notes (Signed)
ID Brief Note  On comfort care since 3/10. ID will SO. Please call with questions.  Rosiland Oz, MD Infectious Disease Physician [facility] [address] [place], [place] [address] Phone: [phone]  Fax: [phone]

## 2022-05-30 NOTE — Progress Notes (Signed)
Patient's respiratory rate noted to be half of what it has been throughout the night. RR is 4-5, other VSS. Family notified due to distance of patient's possible impending passing. Vickie, the significant other, and daughter Gomez Cleverly both notified this morning of patient's condition. Both parties expressed gratitude and stated they would be coming in shortly.

## 2022-05-31 DIAGNOSIS — Z515 Encounter for palliative care: Secondary | ICD-10-CM | POA: Diagnosis not present

## 2022-05-31 DIAGNOSIS — C799 Secondary malignant neoplasm of unspecified site: Secondary | ICD-10-CM

## 2022-05-31 DIAGNOSIS — N179 Acute kidney failure, unspecified: Secondary | ICD-10-CM | POA: Diagnosis not present

## 2022-05-31 DIAGNOSIS — G9341 Metabolic encephalopathy: Secondary | ICD-10-CM

## 2022-05-31 DIAGNOSIS — R17 Unspecified jaundice: Principal | ICD-10-CM

## 2022-05-31 DIAGNOSIS — R52 Pain, unspecified: Secondary | ICD-10-CM

## 2022-05-31 DIAGNOSIS — Z79899 Other long term (current) drug therapy: Secondary | ICD-10-CM

## 2022-05-31 DIAGNOSIS — Z66 Do not resuscitate: Secondary | ICD-10-CM

## 2022-05-31 DIAGNOSIS — K831 Obstruction of bile duct: Secondary | ICD-10-CM | POA: Diagnosis not present

## 2022-05-31 LAB — CYTOLOGY - NON PAP

## 2022-05-31 MED ORDER — SODIUM CHLORIDE 0.9% FLUSH
5.0000 mL | Freq: Every day | INTRAVENOUS | Status: DC
  Administered 2022-06-01 – 2022-06-02 (×2): 5 mL

## 2022-05-31 NOTE — Progress Notes (Signed)
Patient is s/p hepatic fluid collection aspiration and drain placement by Dr. Annamaria Boots on 05/28/2022.   Palliative team has been following and patient is now comfort care, anticipating hospital death.   IR will sign off, continue routine drain care with flushing with 5 mL NS QD and dressing change as needed.  Please call IR for questions and concerns.   Armando Gang Vineta Carone PA-C 05/31/2022 3:34 PM

## 2022-05-31 NOTE — Progress Notes (Signed)
PROGRESS NOTE  Eddie Mendoza  [id number] DOB: [birth year] DOA: 05/27/2022 PCP: [facility]   Brief Narrative: Patient is a 72 year old male with history of BPH status post TURP, hypertension, alcohol abuse, recent diagnosis of malignant biliary stricture , biopsy and biliary stent placement who presented with weakness, lethargy for evaluation as per suggestion his gastroenterologist.  Patient was recently hospitalized from 2/21 until 2/27 during which he underwent ERCP with stent placement in the pancreatic duct and common bile duct.  Patient had severely elevated  bilirubin concerning for pancreatic malignancy, CA19-9 was severely elavated.Pathology was not conclusive.  On presentation, imaging findings showed multiple hepatic abscesses.  Started on broad-spectrum antibiotics, underwent percutaneous drainage of liver abscess.  Hospital course remarkable for severe encephalopathy.  Palliative care consulted for goals of care, care transitioned to comfort.Anticipated hospital death  Assessment & Plan:  Principal Problem:   Obstructive jaundice Active Problems:   Acute blood loss anemia   Biliary obstruction   Hepatic abscess   Acute metabolic encephalopathy   Elevated liver function tests   Biliary stricture   Paralytic ileus of large intestine (HCC)   Abnormal CT of the abdomen   Palliative care encounter   AKI (acute kidney injury) ([place])   Elevated bilirubin   Metastatic malignant neoplasm (HCC)   End of life care   Pain   DNR (do not resuscitate)   High risk medication use   Metabolic encephalopathy  Multiple liver abscesses/biliary collections, also jaundice with malignant stricture from suspected metastatic cancer, gram-positive bacteremia: Recent diagnosis of malignant biliary stricture on ERCP, biopsy and biliary stent placement.  Patient was recently hospitalized from 2/21 until 2/27 during which he underwent ERCP with stent placement in the pancreatic duct and  common bile duct.  T. bili of 14.8. patient had severe elevated  bilirubin concerning for pancreatic biopsy, pathology was not conclusive. CA19-9 elevated. On presentation, imaging findings showed multiple hepatic abscesses.  Started on broad-spectrum antibiotics, underwent percutaneous drainage of liver abscess.  Status post IR guided drainage Blood cultures showed mixed organisms strept  mitis/oralis, bacteroids fragilis.  Had leukocytosis with WBC count in the range of 20 K. ID was also consulted.Palliative care  following with comfort care approach. Currently on full comfort care, antibiotics discontinued  Acute on chronic anemia: Hemoglobin of 6.5 on presentation.  Received 3 units of PRBC.  Last hemoglobin stable.  Acute metabolic encephalopathy: Suspected to be from sepsis .CT head normal.  Chronic alcoholism.  Remains unresponisve  AKI: Given IV fluids.  Last creatinine 1.2  Severe protein calorie moderation: Albumin of less than 1.5.  Ileus: Distended abdomen, KUB with large amount of gas, no evidence of bowel obstruction.  GI was following.  Currently on comfort care  Goals of care: Long discussion done with his spouse on phone on 3/10.Marland Kitchen  His spouse and his daughter were  agreeable for full comfort care.  They understand the prognosis.Anticipated hospital death        DVT prophylaxis:SCDs Start: 05/31/2022 0748     Code Status: DNR  Family Communication:: Discussed with friends and family on 3/11 who were around him  Patient status: Inpatient  Patient is from : Home  Anticipated discharge to: anticipated hospital death     Consultants: GI, palliative care,ID  Procedures: Right liver abscess drain placement  Antimicrobials:  Anti-infectives (From admission, onward)    Start     Dose/Rate Route Frequency Ordered Stop   06/12/2022 1400  piperacillin-tazobactam (ZOSYN) IVPB 3.375 g  Status:  Discontinued        3.375 g 12.5 mL/hr over 240 Minutes Intravenous Every 8  hours 05/24/2022 0751 05/29/22 1259   05/28/2022 0730  piperacillin-tazobactam (ZOSYN) IVPB 3.375 g        3.375 g 100 mL/hr over 30 Minutes Intravenous  Once 06/10/2022 0729 05/28/2022 2030       Subjective: Patient seen and examined the bedside this morning.  Remains unresponsive, on full comfort care, looks like near the end of his life  Objective: Vitals:   05/30/22 1800 05/30/22 1933 05/31/22 0000 05/31/22 0800  BP:  (!) 107/32 (!) 111/32   Pulse:      Resp: (!) '8 10 11 '$ (!) 9  Temp:   98.1 F (36.7 C)   TempSrc:   Oral   SpO2:      Weight:      Height:        Intake/Output Summary (Last 24 hours) at 05/31/2022 1034 Last data filed at 05/31/2022 0800 Gross per 24 hour  Intake 30.32 ml  Output 985 ml  Net -954.68 ml   Filed Weights   06/19/2022 1100 05/27/22 0446 05/30/22 0500  Weight: 89.2 kg 88.9 kg 88.7 kg    Examination:  General exam: Lying in bed, unresponsive, shallow breathing, bilateral lower extremity edema, Foley catheter  Data Reviewed: I have personally reviewed following labs and imaging studies  CBC: Recent Labs  Lab 06/08/2022 0508 06/03/2022 1625 06/15/2022 2247 05/27/22 0323 05/27/22 1426 05/27/22 1427 05/28/22 0733 05/29/22 0329  WBC 19.5* 21.3*  --  16.7*  --  19.3* 20.1* 20.5*  NEUTROABS 16.3* 18.2*  --   --   --  16.3* 17.2* 18.1*  HGB 6.5* 6.6*   < > 6.5* 9.7* 9.6* 10.5* 8.9*  HCT 19.3* 19.3*   < > 18.8* 27.7* 28.2* 31.3* 25.6*  MCV 87.7 86.2  --  87.9  --  84.7 88.2 86.5  PLT 106* 72*  --  65*  --  76* 69* 122*   < > = values in this interval not displayed.   Basic Metabolic Panel: Recent Labs  Lab 06/12/2022 1342 05/27/22 0323 05/27/22 1427 05/28/22 0733 05/29/22 0329  NA 137 133* 136 137 145  K 2.5* 3.1* 2.6* 3.6 3.9  CL 106 110 110 110 117*  CO2 18* 16* 17* 15* 16*  GLUCOSE 102* 125* 128* 104* 100*  BUN 77* 80* 78* 67* 55*  CREATININE 1.50* 1.64* 1.61* 1.04 1.25*  CALCIUM 7.4* 6.9* 7.4* 7.3* 7.6*  MG  --   --  3.5*  --   --    PHOS  --   --  4.5  --   --      Recent Results (from the past 240 hour(s))  Blood culture (routine x 2)     Status: Abnormal   Collection Time: 05/23/2022  5:20 AM   Specimen: BLOOD  Result Value Ref Range Status   Specimen Description   Final    BLOOD LEFT ANTECUBITAL Performed at [facility], [place] [address] [place], [place] [address]    Special Requests   Final    BOTTLES DRAWN AEROBIC AND ANAEROBIC Blood Culture results may not be optimal due to an excessive volume of blood received in culture bottles Performed at [place] [address] [place], [place] [address]    Culture  Setup Time   Final    GRAM NEGATIVE RODS ANAEROBIC BOTTLE ONLY CRITICAL RESULT CALLED TO, READ BACK BY  AND VERIFIED WITH: PHARMD A. [facility] [id number] @ 2051 [place] GRAM POSITIVE COCCI IN CHAINS BOTTLES DRAWN AEROBIC ONLY CRITICAL VALUE NOTED.  VALUE IS CONSISTENT WITH PREVIOUSLY REPORTED AND CALLED VALUE.    Culture (A)  Final    STREPTOCOCCUS MITIS/ORALIS BACTEROIDES FRAGILIS BETA LACTAMASE POSITIVE Performed at [facility], [place] [address] [place], [place] [address]    Report Status 05/28/2022 FINAL  Final   Organism ID, Bacteria STREPTOCOCCUS MITIS/ORALIS  Final      Susceptibility   Streptococcus mitis/oralis - MIC*    TETRACYCLINE >=16 RESISTANT Resistant     VANCOMYCIN 0.5 SENSITIVE Sensitive     CLINDAMYCIN >=1 RESISTANT Resistant     * STREPTOCOCCUS MITIS/ORALIS  Blood Culture ID Panel (Reflexed)     Status: Abnormal   Collection Time: 06/05/2022  5:20 AM  Result Value Ref Range Status   Enterococcus faecalis NOT DETECTED NOT DETECTED Final   Enterococcus Faecium NOT DETECTED NOT DETECTED Final   Listeria monocytogenes NOT DETECTED NOT DETECTED Final   Staphylococcus species NOT DETECTED NOT DETECTED Final   Staphylococcus aureus (BCID) NOT DETECTED NOT DETECTED Final   Staphylococcus epidermidis NOT DETECTED NOT DETECTED Final    Staphylococcus lugdunensis NOT DETECTED NOT DETECTED Final   Streptococcus species DETECTED (A) NOT DETECTED Final    Comment: Not Enterococcus species, Streptococcus agalactiae, Streptococcus pyogenes, or Streptococcus pneumoniae. CRITICAL RESULT CALLED TO, READ BACK BY AND VERIFIED WITH: PHARMD A. Nelia Shi [id number] @ 2051 [facility]    Streptococcus agalactiae NOT DETECTED NOT DETECTED Final   Streptococcus pneumoniae NOT DETECTED NOT DETECTED Final   Streptococcus pyogenes NOT DETECTED NOT DETECTED Final   A.calcoaceticus-baumannii NOT DETECTED NOT DETECTED Final   Bacteroides fragilis DETECTED (A) NOT DETECTED Final    Comment: CRITICAL RESULT CALLED TO, READ BACK BY AND VERIFIED WITH: PHARMD A. [facility] [id number] @ 2051 [place]    Enterobacterales NOT DETECTED NOT DETECTED Final   Enterobacter cloacae complex NOT DETECTED NOT DETECTED Final   Escherichia coli NOT DETECTED NOT DETECTED Final   Klebsiella aerogenes NOT DETECTED NOT DETECTED Final   Klebsiella oxytoca NOT DETECTED NOT DETECTED Final   Klebsiella pneumoniae NOT DETECTED NOT DETECTED Final   Proteus species NOT DETECTED NOT DETECTED Final   Salmonella species NOT DETECTED NOT DETECTED Final   Serratia marcescens NOT DETECTED NOT DETECTED Final   Haemophilus influenzae NOT DETECTED NOT DETECTED Final   Neisseria meningitidis NOT DETECTED NOT DETECTED Final   Pseudomonas aeruginosa NOT DETECTED NOT DETECTED Final   Stenotrophomonas maltophilia NOT DETECTED NOT DETECTED Final   Candida albicans NOT DETECTED NOT DETECTED Final   Candida auris NOT DETECTED NOT DETECTED Final   Candida glabrata NOT DETECTED NOT DETECTED Final   Candida krusei NOT DETECTED NOT DETECTED Final   Candida parapsilosis NOT DETECTED NOT DETECTED Final   Candida tropicalis NOT DETECTED NOT DETECTED Final   Cryptococcus neoformans/gattii NOT DETECTED NOT DETECTED Final    Comment: Performed at [facility], [place] [address] [place], [place] [address]  Blood  culture (routine x 2)     Status: Abnormal   Collection Time: 06/11/2022  5:37 AM   Specimen: BLOOD LEFT FOREARM  Result Value Ref Range Status   Specimen Description BLOOD LEFT FOREARM  Final   Special Requests   Final    BOTTLES DRAWN AEROBIC AND ANAEROBIC Blood Culture adequate volume   Culture  Setup Time   Final    GRAM POSITIVE COCCI IN CHAINS IN BOTH AEROBIC AND ANAEROBIC  BOTTLES CRITICAL RESULT CALLED TO, READ BACK BY AND VERIFIED WITH: PHARMD A. Nelia Shi [id number] @ 2051 [facility]    Culture (A)  Final    STREPTOCOCCUS MITIS/ORALIS SUSCEPTIBILITIES PERFORMED ON PREVIOUS CULTURE WITHIN THE LAST 5 DAYS.    Report Status 05/28/2022 FINAL  Final  Blood Culture ID Panel (Reflexed)     Status: Abnormal   Collection Time: 05/31/2022  5:37 AM  Result Value Ref Range Status   Enterococcus faecalis NOT DETECTED NOT DETECTED Final   Enterococcus Faecium NOT DETECTED NOT DETECTED Final   Listeria monocytogenes NOT DETECTED NOT DETECTED Final   Staphylococcus species NOT DETECTED NOT DETECTED Final   Staphylococcus aureus (BCID) NOT DETECTED NOT DETECTED Final   Staphylococcus epidermidis NOT DETECTED NOT DETECTED Final   Staphylococcus lugdunensis NOT DETECTED NOT DETECTED Final   Streptococcus species DETECTED (A) NOT DETECTED Final    Comment: Not Enterococcus species, Streptococcus agalactiae, Streptococcus pyogenes, or Streptococcus pneumoniae. CRITICAL RESULT CALLED TO, READ BACK BY AND VERIFIED WITH: PHARMD A. Nelia Shi [id number] @ 2051 [facility]    Streptococcus agalactiae NOT DETECTED NOT DETECTED Final   Streptococcus pneumoniae NOT DETECTED NOT DETECTED Final   Streptococcus pyogenes NOT DETECTED NOT DETECTED Final   A.calcoaceticus-baumannii NOT DETECTED NOT DETECTED Final   Bacteroides fragilis DETECTED (A) NOT DETECTED Final    Comment: CRITICAL RESULT CALLED TO, READ BACK BY AND VERIFIED WITH: PHARMD A. [facility] [id number] @ 2051 [place]    Enterobacterales NOT DETECTED NOT DETECTED Final    Enterobacter cloacae complex NOT DETECTED NOT DETECTED Final   Escherichia coli NOT DETECTED NOT DETECTED Final   Klebsiella aerogenes NOT DETECTED NOT DETECTED Final   Klebsiella oxytoca NOT DETECTED NOT DETECTED Final   Klebsiella pneumoniae NOT DETECTED NOT DETECTED Final   Proteus species NOT DETECTED NOT DETECTED Final   Salmonella species NOT DETECTED NOT DETECTED Final   Serratia marcescens NOT DETECTED NOT DETECTED Final   Haemophilus influenzae NOT DETECTED NOT DETECTED Final   Neisseria meningitidis NOT DETECTED NOT DETECTED Final   Pseudomonas aeruginosa NOT DETECTED NOT DETECTED Final   Stenotrophomonas maltophilia NOT DETECTED NOT DETECTED Final   Candida albicans NOT DETECTED NOT DETECTED Final   Candida auris NOT DETECTED NOT DETECTED Final   Candida glabrata NOT DETECTED NOT DETECTED Final   Candida krusei NOT DETECTED NOT DETECTED Final   Candida parapsilosis NOT DETECTED NOT DETECTED Final   Candida tropicalis NOT DETECTED NOT DETECTED Final   Cryptococcus neoformans/gattii NOT DETECTED NOT DETECTED Final    Comment: Performed at [facility], [place] [address], [place], [place] [address]  MRSA Next Gen by PCR, Nasal     Status: None   Collection Time: 05/21/2022  9:37 AM   Specimen: Nasal Mucosa; Nasal Swab  Result Value Ref Range Status   MRSA by PCR Next Gen NOT DETECTED NOT DETECTED Final    Comment: (NOTE) The GeneXpert MRSA Assay (FDA approved for NASAL specimens only), is one component of a comprehensive MRSA colonization surveillance program. It is not intended to diagnose MRSA infection nor to guide or monitor treatment for MRSA infections. Test performance is not FDA approved in patients less than 40 years old. Performed at [facility], [place] [address], [place], [place] [address]   Aerobic/Anaerobic Culture w Gram Stain (surgical/deep wound)     Status: None   Collection Time: 06/12/2022  1:00 PM   Specimen: Liver; Abscess  Result  Value Ref Range Status   Specimen Description   Final  LIVER Performed at [facility], [place] [address], [place], [place] [address]    Special Requests   Final    NONE Performed at [facility], [place] [address] [place], [place] [address]    Gram Stain   Final    NO WBC SEEN ABUNDANT GRAM NEGATIVE RODS ABUNDANT GRAM POSITIVE COCCI IN CHAINS    Culture   Final    ABUNDANT STREPTOCOCCUS PARASANGUINIS ABUNDANT STAPHYLOCOCCUS HAEMOLYTICUS ABUNDANT BACTEROIDES FRAGILIS BETA LACTAMASE NEGATIVE Performed at [facility], [place] [address] [place], [place] [address]    Report Status 05/30/2022 FINAL  Final   Organism ID, Bacteria STAPHYLOCOCCUS HAEMOLYTICUS  Final   Organism ID, Bacteria STREPTOCOCCUS PARASANGUINIS  Final      Susceptibility   Streptococcus parasanguinis - MIC*    PENICILLIN 0.12 SENSITIVE Sensitive     CEFTRIAXONE <=0.12 SENSITIVE Sensitive     ERYTHROMYCIN >=8 RESISTANT Resistant     LEVOFLOXACIN 8 RESISTANT Resistant     VANCOMYCIN 0.5 SENSITIVE Sensitive     * ABUNDANT STREPTOCOCCUS PARASANGUINIS   Staphylococcus haemolyticus - MIC*    CIPROFLOXACIN >=8 RESISTANT Resistant     ERYTHROMYCIN >=8 RESISTANT Resistant     GENTAMICIN 2 SENSITIVE Sensitive     OXACILLIN >=4 RESISTANT Resistant     TETRACYCLINE <=1 SENSITIVE Sensitive     VANCOMYCIN <=0.5 SENSITIVE Sensitive     TRIMETH/SULFA >=320 RESISTANT Resistant     CLINDAMYCIN RESISTANT Resistant     RIFAMPIN <=0.5 SENSITIVE Sensitive     Inducible Clindamycin POSITIVE Resistant     * ABUNDANT STAPHYLOCOCCUS HAEMOLYTICUS  Culture, blood (Routine X 2) w Reflex to ID Panel     Status: None (Preliminary result)   Collection Time: 05/28/22  7:55 AM   Specimen: BLOOD RIGHT ARM  Result Value Ref Range Status   Specimen Description   Final    BLOOD RIGHT ARM Performed at [place] [address], [place], [place] [address]    Special Requests    Final    BOTTLES DRAWN AEROBIC ONLY Blood Culture results may not be optimal due to an inadequate volume of blood received in culture bottles Performed at [place] [address], [place], [place] [address]    Culture   Final    NO GROWTH 3 DAYS Performed at [facility], [place] [address], [place], [place] [address]    Report Status PENDING  Incomplete  Culture, blood (Routine X 2) w Reflex to ID Panel     Status: None (Preliminary result)   Collection Time: 05/28/22  7:56 AM   Specimen: BLOOD RIGHT HAND  Result Value Ref Range Status   Specimen Description   Final    BLOOD RIGHT HAND Performed at [place] [address] [place], [place] [address]    Special Requests   Final    BOTTLES DRAWN AEROBIC ONLY Blood Culture results may not be optimal due to an inadequate volume of blood received in culture bottles Performed at [place] [address], [place], [place] [address]    Culture   Final    NO GROWTH 3 DAYS Performed at [facility], [place] [address] [place],  [address]    Report Status PENDING  Incomplete     Radiology Studies: No results found.  Scheduled Meds:  glycopyrrolate  0.1 mg Intravenous TID   sodium chloride flush  3 mL Intravenous Q12H  sodium chloride flush  5 mL Intracatheter Q8H   Continuous Infusions:  HYDROmorphone 1 mg/hr (05/31/22 0800)   lactated ringers 1,000 mL with potassium chloride 40 mEq infusion Stopped (05/29/22 1301)     LOS: 5 days   Shelly Coss, MD [facility] P3/02/2023, 10:34 AM

## 2022-05-31 NOTE — Progress Notes (Signed)
  Daily Progress Note   Patient Name: Eddie Mendoza       Date: 05/31/2022 DOB: [birth year]  Age: 72 y.o. MRN#: [id number] Attending Physician: Shelly Coss, MD Primary Care Physician: [facility] Admit Date: 06/10/2022 Length of Stay: 5 days  Reason for Consultation/Follow-up:  End-of-life management  Subjective:    Subjective:  As per EMR review, patient received 1 mg IV Dilaudid bolus x 4 doses in past 24 hours.  Presented to bedside to check on patient.  Patient unresponsive to voice or touch.  Patient appears very comfortable at this time on continuous IV Dilaudid infusion.  No family present at bedside during visit.  Discussed care with bedside RN for updates medication review.  Objective:   Vital Signs:  BP (!) 111/32 (BP Location: Left Arm)   Pulse 88   Temp 98.1 F (36.7 C) (Oral)   Resp 11   Ht 6' (1.829 m)   Wt 88.7 kg   SpO2 (!) 89%   BMI 26.52 kg/m   Physical Exam: General: Unresponsive, chronically ill-appearing Eyes: No drainage noted Cardiovascular: RRR, edema present Respiratory: no increased work of breathing noted, not in respiratory distress Neuro: Unresponsive  Imaging:  I personally reviewed recent imaging.   Assessment & Plan:   Assessment: Patient is a 72 y.o. 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 He was admitted on 06/07/2022 from home with generalized weakness.   Medicine team consulted to assist with complex medical decision making and symptom management.  Recommendations/Plan: # Complex medical decision making/goals of care:  -As per EMR review, patient was transitioned to comfort measures on 05/29/22 in setting of metastatic cancer with gram-positive bacteremia.  Patient unresponsive at this time due to medical condition. Anticipate  hospital death.   -Present at bedside today when visiting.  -  Code Status: DNR Prognosis: Hours - Days  # Symptom management:  -Pain/Dyspnea, acute in the setting of end-of-life care                               -Continue IV Dilaudid 1mg /hr continuous basal dose and 1mg  q30 prn bolus dosing.  Continue to adjust based on patient's symptom burden.                    -Secretions, in the setting of end-of-life care                               -Continue IV glycopyrrolate 0.1 mg TID.  # Discharge Planning: In hospital death anticipated.   Discussed with: bedside RN  Thank you for allowing the palliative care team to participate in the care [place].  Chelsea Aus, DO Palliative Care Provider PMT # [phone]  If patient remains symptomatic despite maximum doses, please call PMT at [phone] between 0700 and 1900. Outside of these hours, please call attending, as PMT does not have night coverage.  *Please note that this is a verbal dictation therefore any spelling or grammatical errors are due to the "[place] [employer]" system interpretation.

## 2022-06-01 DIAGNOSIS — Z66 Do not resuscitate: Secondary | ICD-10-CM | POA: Diagnosis not present

## 2022-06-01 DIAGNOSIS — N179 Acute kidney failure, unspecified: Secondary | ICD-10-CM | POA: Diagnosis not present

## 2022-06-01 DIAGNOSIS — K831 Obstruction of bile duct: Secondary | ICD-10-CM | POA: Diagnosis not present

## 2022-06-01 DIAGNOSIS — G9341 Metabolic encephalopathy: Secondary | ICD-10-CM | POA: Diagnosis not present

## 2022-06-01 DIAGNOSIS — Z515 Encounter for palliative care: Secondary | ICD-10-CM | POA: Diagnosis not present

## 2022-06-01 MED ORDER — LORAZEPAM 2 MG/ML IJ SOLN
2.0000 mg | INTRAMUSCULAR | Status: DC | PRN
  Administered 2022-06-02: 2 mg via INTRAVENOUS
  Filled 2022-06-01: qty 1

## 2022-06-01 MED ORDER — HYDROMORPHONE BOLUS VIA INFUSION
2.0000 mg | INTRAVENOUS | Status: DC | PRN
  Administered 2022-06-01 – 2022-06-02 (×5): 2 mg via INTRAVENOUS

## 2022-06-01 MED ORDER — HYDROMORPHONE HCL-NACL 50-0.9 MG/50ML-% IV SOLN
2.0000 mg/h | INTRAVENOUS | Status: DC
  Administered 2022-06-01 – 2022-06-02 (×2): 2 mg/h via INTRAVENOUS
  Filled 2022-06-01 (×3): qty 50

## 2022-06-01 NOTE — Progress Notes (Signed)
  Daily Progress Note   Patient Name: Eddie Mendoza       Date: 06/01/2022 DOB: [birth year]  Age: 72 y.o. MRN#: [id number] Attending Physician: Shelly Coss, MD Primary Care Physician: [facility] Admit Date: 06-01-2022 Length of Stay: 6 days  The patient was initially comfortable during the day though informed by bedside RN later in afternoon patient becoming agitated.  Discussed care management plan and adjustment of medications accordingly.  A&P: Comfort focused care at the end of life. -Increase IV Dilaudid continuous basal to 2 mg/h -Increase IV Dilaudid basal dose to 2 mg every 30 minutes as needed -Add IV Ativan 2 mg every 4 hours as needed  Chelsea Aus, DO Palliative Care Provider PMT # [phone]  If patient remains symptomatic despite maximum doses, please call PMT at [phone] between 0700 and 1900. Outside of these hours, please call attending, as PMT does not have night coverage.  *Please note that this is a verbal dictation therefore any spelling or grammatical errors are due to the "[place] One" system interpretation.

## 2022-06-01 NOTE — Progress Notes (Signed)
  Daily Progress Note   Patient Name: Eddie Mendoza       Date: 06/01/2022 DOB: 1950-06-06  Age: 72 y.o. MRN#: [id number] Attending Physician: Shelly Coss, MD Primary Care Physician: [facility] Admit Date: 05/24/2022 Length of Stay: 6 days  Maintaining comfort focused care. Patient appears comfortable at this time. No medication adjustments currently. In hospital death anticipated.   Chelsea Aus, DO Palliative Care Provider PMT # [phone]

## 2022-06-01 NOTE — Progress Notes (Signed)
PROGRESS NOTE  Eddie Mendoza  [id number] DOB: Aug 11, 1950 DOA: 05/25/2022 PCP: [facility]   Brief Narrative: Patient is a 72 year old male with history of BPH status post TURP, hypertension, alcohol abuse, recent diagnosis of malignant biliary stricture , biopsy and biliary stent placement who presented with weakness, lethargy for evaluation as per suggestion his gastroenterologist.  Patient was recently hospitalized from 2/21 until 2/27 during which he underwent ERCP with stent placement in the pancreatic duct and common bile duct.  Patient had severely elevated  bilirubin concerning for pancreatic malignancy, CA19-9 was severely elavated.Pathology was not conclusive.  On presentation, imaging findings showed multiple hepatic abscesses.  Started on broad-spectrum antibiotics, underwent percutaneous drainage of liver abscess.  Hospital course remarkable for severe encephalopathy.  Palliative care consulted for goals of care, care transitioned to comfort.Anticipated hospital death  Assessment & Plan:  Principal Problem:   Obstructive jaundice Active Problems:   Acute blood loss anemia   Biliary obstruction   Hepatic abscess   Acute metabolic encephalopathy   Elevated liver function tests   Biliary stricture   Paralytic ileus of large intestine (HCC)   Abnormal CT of the abdomen   Palliative care encounter   AKI (acute kidney injury) ([place])   Elevated bilirubin   Metastatic malignant neoplasm (HCC)   End of life care   Pain   DNR (do not resuscitate)   High risk medication use   Metabolic encephalopathy  Multiple liver abscesses/biliary collections, also jaundice with malignant stricture from suspected metastatic cancer, gram-positive bacteremia: Recent diagnosis of malignant biliary stricture on ERCP, biopsy and biliary stent placement.  Patient was recently hospitalized from 2/21 until 2/27 during which he underwent ERCP with stent placement in the pancreatic duct and  common bile duct.  T. bili of 14.8. patient had severe elevated  bilirubin concerning for pancreatic biopsy, pathology was not conclusive. CA19-9 elevated. On presentation, imaging findings showed multiple hepatic abscesses.  Started on broad-spectrum antibiotics, underwent percutaneous drainage of liver abscess.  Status post IR guided drainage Blood cultures showed mixed organisms strept  mitis/oralis, bacteroids fragilis.  Had leukocytosis with WBC count in the range of 20 K. ID was also consulted.Palliative care  following with comfort care approach. Currently on full comfort care, antibiotics discontinued  Acute on chronic anemia: Hemoglobin of 6.5 on presentation.  Received 3 units of PRBC.  Last hemoglobin stable.  Acute metabolic encephalopathy: Suspected to be from sepsis .CT head normal.  Chronic alcoholism.  Remains unresponisve  AKI: Given IV fluids.  Last creatinine 1.2  Severe protein calorie moderation: Albumin of less than 1.5.  Ileus: Distended abdomen, KUB with large amount of gas, no evidence of bowel obstruction.  GI was following.  Currently on comfort care  Goals of care: Long discussion done with his spouse on phone on 3/10.Marland Kitchen  His spouse and his daughter were  agreeable for full comfort care.  They understand the prognosis.Anticipated hospital death        DVT prophylaxis:SCDs Start: 06/13/2022 0748     Code Status: DNR  Family Communication:: Discussed with friends and family on 3/11 who were around him  Patient status: Inpatient  Patient is from : Home  Anticipated discharge to: anticipated hospital death     Consultants: GI, palliative care,ID  Procedures: Right liver abscess drain placement  Antimicrobials:  Anti-infectives (From admission, onward)    Start     Dose/Rate Route Frequency Ordered Stop   06/15/2022 1400  piperacillin-tazobactam (ZOSYN) IVPB 3.375 g  Status:  Discontinued        3.375 g 12.5 mL/hr over 240 Minutes Intravenous Every 8  hours 06/05/2022 0751 05/29/22 1259   05/22/2022 0730  piperacillin-tazobactam (ZOSYN) IVPB 3.375 g        3.375 g 100 mL/hr over 30 Minutes Intravenous  Once 06/10/2022 0729 06/07/2022 2030       Subjective: Patient seen and examined at the bedside today.  Mr. Hon remains unresponsive, lying in bed.  Looks comfortable, not in any distress.  Seems like he is at the end of his life, having shallow breathing  Objective: Vitals:   06/01/22 0700 06/01/22 0746 06/01/22 0800 06/01/22 0900  BP:      Pulse:      Resp: (!) 7 (!) 7 (!) 6 (!) 7  Temp:      TempSrc:      SpO2:      Weight:      Height:        Intake/Output Summary (Last 24 hours) at 06/01/2022 1124 Last data filed at 06/01/2022 0400 Gross per 24 hour  Intake 18.97 ml  Output 750 ml  Net -731.03 ml   Filed Weights   05/25/2022 1100 05/27/22 0446 05/30/22 0500  Weight: 89.2 kg 88.9 kg 88.7 kg    Examination:  General exam:Lying in bed, unresponsive, shallow breathing, bilateral lower extremity edema, Foley catheter  Data Reviewed: I have personally reviewed following labs and imaging studies  CBC: Recent Labs  Lab 06/18/2022 0508 06/19/2022 1625 06/18/2022 2247 05/27/22 0323 05/27/22 1426 05/27/22 1427 05/28/22 0733 05/29/22 0329  WBC 19.5* 21.3*  --  16.7*  --  19.3* 20.1* 20.5*  NEUTROABS 16.3* 18.2*  --   --   --  16.3* 17.2* 18.1*  HGB 6.5* 6.6*   < > 6.5* 9.7* 9.6* 10.5* 8.9*  HCT 19.3* 19.3*   < > 18.8* 27.7* 28.2* 31.3* 25.6*  MCV 87.7 86.2  --  87.9  --  84.7 88.2 86.5  PLT 106* 72*  --  65*  --  76* 69* 122*   < > = values in this interval not displayed.   Basic Metabolic Panel: Recent Labs  Lab 06/10/2022 1342 05/27/22 0323 05/27/22 1427 05/28/22 0733 05/29/22 0329  NA 137 133* 136 137 145  K 2.5* 3.1* 2.6* 3.6 3.9  CL 106 110 110 110 117*  CO2 18* 16* 17* 15* 16*  GLUCOSE 102* 125* 128* 104* 100*  BUN 77* 80* 78* 67* 55*  CREATININE 1.50* 1.64* 1.61* 1.04 1.25*  CALCIUM 7.4* 6.9* 7.4* 7.3* 7.6*   MG  --   --  3.5*  --   --   PHOS  --   --  4.5  --   --      Recent Results (from the past 240 hour(s))  Blood culture (routine x 2)     Status: Abnormal   Collection Time: 05/31/2022  5:20 AM   Specimen: BLOOD  Result Value Ref Range Status   Specimen Description   Final    BLOOD LEFT ANTECUBITAL Performed at [facility], [place] [address] [place], [place] [address]    Special Requests   Final    BOTTLES DRAWN AEROBIC AND ANAEROBIC Blood Culture results may not be optimal due to an excessive volume of blood received in culture bottles Performed at [facility] [address], [place],  [address]    Culture  Setup Time   Final    GRAM NEGATIVE RODS  ANAEROBIC BOTTLE ONLY CRITICAL RESULT CALLED TO, READ BACK BY AND VERIFIED WITH: PHARMD A. [facility] [id number] @ [address] [place] GRAM POSITIVE COCCI IN CHAINS BOTTLES DRAWN AEROBIC ONLY CRITICAL VALUE NOTED.  VALUE IS CONSISTENT WITH PREVIOUSLY REPORTED AND CALLED VALUE.    Culture (A)  Final    STREPTOCOCCUS MITIS/ORALIS BACTEROIDES FRAGILIS BETA LACTAMASE POSITIVE Performed at [facility], [place] [address], [place], [place] [address]    Report Status 05/28/2022 FINAL  Final   Organism ID, Bacteria STREPTOCOCCUS MITIS/ORALIS  Final      Susceptibility   Streptococcus mitis/oralis - MIC*    TETRACYCLINE >=16 RESISTANT Resistant     VANCOMYCIN 0.5 SENSITIVE Sensitive     CLINDAMYCIN >=1 RESISTANT Resistant     * STREPTOCOCCUS MITIS/ORALIS  Blood Culture ID Panel (Reflexed)     Status: Abnormal   Collection Time: 06/05/2022  5:20 AM  Result Value Ref Range Status   Enterococcus faecalis NOT DETECTED NOT DETECTED Final   Enterococcus Faecium NOT DETECTED NOT DETECTED Final   Listeria monocytogenes NOT DETECTED NOT DETECTED Final   Staphylococcus species NOT DETECTED NOT DETECTED Final   Staphylococcus aureus (BCID) NOT DETECTED NOT DETECTED Final   Staphylococcus epidermidis NOT DETECTED NOT  DETECTED Final   Staphylococcus lugdunensis NOT DETECTED NOT DETECTED Final   Streptococcus species DETECTED (A) NOT DETECTED Final    Comment: Not Enterococcus species, Streptococcus agalactiae, Streptococcus pyogenes, or Streptococcus pneumoniae. CRITICAL RESULT CALLED TO, READ BACK BY AND VERIFIED WITH: PHARMD A. Nelia Shi [id number] @ 2051 [facility]    Streptococcus agalactiae NOT DETECTED NOT DETECTED Final   Streptococcus pneumoniae NOT DETECTED NOT DETECTED Final   Streptococcus pyogenes NOT DETECTED NOT DETECTED Final   A.calcoaceticus-baumannii NOT DETECTED NOT DETECTED Final   Bacteroides fragilis DETECTED (A) NOT DETECTED Final    Comment: CRITICAL RESULT CALLED TO, READ BACK BY AND VERIFIED WITH: PHARMD A. [facility] [id number] @ [address] [place]    Enterobacterales NOT DETECTED NOT DETECTED Final   Enterobacter cloacae complex NOT DETECTED NOT DETECTED Final   Escherichia coli NOT DETECTED NOT DETECTED Final   Klebsiella aerogenes NOT DETECTED NOT DETECTED Final   Klebsiella oxytoca NOT DETECTED NOT DETECTED Final   Klebsiella pneumoniae NOT DETECTED NOT DETECTED Final   Proteus species NOT DETECTED NOT DETECTED Final   Salmonella species NOT DETECTED NOT DETECTED Final   Serratia marcescens NOT DETECTED NOT DETECTED Final   Haemophilus influenzae NOT DETECTED NOT DETECTED Final   Neisseria meningitidis NOT DETECTED NOT DETECTED Final   Pseudomonas aeruginosa NOT DETECTED NOT DETECTED Final   Stenotrophomonas maltophilia NOT DETECTED NOT DETECTED Final   Candida albicans NOT DETECTED NOT DETECTED Final   Candida auris NOT DETECTED NOT DETECTED Final   Candida glabrata NOT DETECTED NOT DETECTED Final   Candida krusei NOT DETECTED NOT DETECTED Final   Candida parapsilosis NOT DETECTED NOT DETECTED Final   Candida tropicalis NOT DETECTED NOT DETECTED Final   Cryptococcus neoformans/gattii NOT DETECTED NOT DETECTED Final    Comment: Performed at [facility], [place] [address] [place],  [place] [address]  Blood culture (routine x 2)     Status: Abnormal   Collection Time: 06/19/2022  5:37 AM   Specimen: BLOOD LEFT FOREARM  Result Value Ref Range Status   Specimen Description BLOOD LEFT FOREARM  Final   Special Requests   Final    BOTTLES DRAWN AEROBIC AND ANAEROBIC Blood Culture adequate volume   Culture  Setup Time   Final  GRAM POSITIVE COCCI IN CHAINS IN BOTH AEROBIC AND ANAEROBIC BOTTLES CRITICAL RESULT CALLED TO, READ BACK BY AND VERIFIED WITH: PHARMD A. Nelia Shi [id number] @ 2051 [facility]    Culture (A)  Final    STREPTOCOCCUS MITIS/ORALIS SUSCEPTIBILITIES PERFORMED ON PREVIOUS CULTURE WITHIN THE LAST 5 DAYS.    Report Status 05/28/2022 FINAL  Final  Blood Culture ID Panel (Reflexed)     Status: Abnormal   Collection Time: 06/06/2022  5:37 AM  Result Value Ref Range Status   Enterococcus faecalis NOT DETECTED NOT DETECTED Final   Enterococcus Faecium NOT DETECTED NOT DETECTED Final   Listeria monocytogenes NOT DETECTED NOT DETECTED Final   Staphylococcus species NOT DETECTED NOT DETECTED Final   Staphylococcus aureus (BCID) NOT DETECTED NOT DETECTED Final   Staphylococcus epidermidis NOT DETECTED NOT DETECTED Final   Staphylococcus lugdunensis NOT DETECTED NOT DETECTED Final   Streptococcus species DETECTED (A) NOT DETECTED Final    Comment: Not Enterococcus species, Streptococcus agalactiae, Streptococcus pyogenes, or Streptococcus pneumoniae. CRITICAL RESULT CALLED TO, READ BACK BY AND VERIFIED WITH: PHARMD A. Nelia Shi [id number] @ 2051 [facility]    Streptococcus agalactiae NOT DETECTED NOT DETECTED Final   Streptococcus pneumoniae NOT DETECTED NOT DETECTED Final   Streptococcus pyogenes NOT DETECTED NOT DETECTED Final   A.calcoaceticus-baumannii NOT DETECTED NOT DETECTED Final   Bacteroides fragilis DETECTED (A) NOT DETECTED Final    Comment: CRITICAL RESULT CALLED TO, READ BACK BY AND VERIFIED WITH: PHARMD A. [facility] [id number] @ [address] [place]    Enterobacterales NOT DETECTED NOT DETECTED  Final   Enterobacter cloacae complex NOT DETECTED NOT DETECTED Final   Escherichia coli NOT DETECTED NOT DETECTED Final   Klebsiella aerogenes NOT DETECTED NOT DETECTED Final   Klebsiella oxytoca NOT DETECTED NOT DETECTED Final   Klebsiella pneumoniae NOT DETECTED NOT DETECTED Final   Proteus species NOT DETECTED NOT DETECTED Final   Salmonella species NOT DETECTED NOT DETECTED Final   Serratia marcescens NOT DETECTED NOT DETECTED Final   Haemophilus influenzae NOT DETECTED NOT DETECTED Final   Neisseria meningitidis NOT DETECTED NOT DETECTED Final   Pseudomonas aeruginosa NOT DETECTED NOT DETECTED Final   Stenotrophomonas maltophilia NOT DETECTED NOT DETECTED Final   Candida albicans NOT DETECTED NOT DETECTED Final   Candida auris NOT DETECTED NOT DETECTED Final   Candida glabrata NOT DETECTED NOT DETECTED Final   Candida krusei NOT DETECTED NOT DETECTED Final   Candida parapsilosis NOT DETECTED NOT DETECTED Final   Candida tropicalis NOT DETECTED NOT DETECTED Final   Cryptococcus neoformans/gattii NOT DETECTED NOT DETECTED Final    Comment: Performed at [facility], [place] [address], [place], [place] [address]  MRSA Next Gen by PCR, Nasal     Status: None   Collection Time: 05/24/2022  9:37 AM   Specimen: Nasal Mucosa; Nasal Swab  Result Value Ref Range Status   MRSA by PCR Next Gen NOT DETECTED NOT DETECTED Final    Comment: (NOTE) The GeneXpert MRSA Assay (FDA approved for NASAL specimens only), is one component of a comprehensive MRSA colonization surveillance program. It is not intended to diagnose MRSA infection nor to guide or monitor treatment for MRSA infections. Test performance is not FDA approved in patients less than 60 years old. Performed at [facility], [place] [address] [place], [place] [address]   Aerobic/Anaerobic Culture w Gram Stain (surgical/deep wound)     Status: None   Collection Time: 05/28/2022  1:00 PM   Specimen: Liver; Abscess   Result Value  Ref Range Status   Specimen Description   Final    LIVER Performed at [facility] [address], [place], [place] [address]    Special Requests   Final    NONE Performed at [facility], [place] [address] [place], [place] [address]    Gram Stain   Final    NO WBC SEEN ABUNDANT GRAM NEGATIVE RODS ABUNDANT GRAM POSITIVE COCCI IN CHAINS    Culture   Final    ABUNDANT STREPTOCOCCUS PARASANGUINIS ABUNDANT STAPHYLOCOCCUS HAEMOLYTICUS ABUNDANT BACTEROIDES FRAGILIS BETA LACTAMASE NEGATIVE Performed at [facility], [place] [address], [place], [place] [address]    Report Status 05/30/2022 FINAL  Final   Organism ID, Bacteria STAPHYLOCOCCUS HAEMOLYTICUS  Final   Organism ID, Bacteria STREPTOCOCCUS PARASANGUINIS  Final      Susceptibility   Streptococcus parasanguinis - MIC*    PENICILLIN 0.12 SENSITIVE Sensitive     CEFTRIAXONE <=0.12 SENSITIVE Sensitive     ERYTHROMYCIN >=8 RESISTANT Resistant     LEVOFLOXACIN 8 RESISTANT Resistant     VANCOMYCIN 0.5 SENSITIVE Sensitive     * ABUNDANT STREPTOCOCCUS PARASANGUINIS   Staphylococcus haemolyticus - MIC*    CIPROFLOXACIN >=8 RESISTANT Resistant     ERYTHROMYCIN >=8 RESISTANT Resistant     GENTAMICIN 2 SENSITIVE Sensitive     OXACILLIN >=4 RESISTANT Resistant     TETRACYCLINE <=1 SENSITIVE Sensitive     VANCOMYCIN <=0.5 SENSITIVE Sensitive     TRIMETH/SULFA >=320 RESISTANT Resistant     CLINDAMYCIN RESISTANT Resistant     RIFAMPIN <=0.5 SENSITIVE Sensitive     Inducible Clindamycin POSITIVE Resistant     * ABUNDANT STAPHYLOCOCCUS HAEMOLYTICUS  Culture, blood (Routine X 2) w Reflex to ID Panel     Status: None (Preliminary result)   Collection Time: 05/28/22  7:55 AM   Specimen: BLOOD RIGHT ARM  Result Value Ref Range Status   Specimen Description   Final    BLOOD RIGHT ARM Performed at [facility] [address], [place], [place] [address]    Special  Requests   Final    BOTTLES DRAWN AEROBIC ONLY Blood Culture results may not be optimal due to an inadequate volume of blood received in culture bottles Performed at [facility] [place] [address], [place], [place] [address]    Culture   Final    NO GROWTH 4 DAYS Performed at [facility], [place] [address], [place], [place] [address]    Report Status PENDING  Incomplete  Culture, blood (Routine X 2) w Reflex to ID Panel     Status: None (Preliminary result)   Collection Time: 05/28/22  7:56 AM   Specimen: BLOOD RIGHT HAND  Result Value Ref Range Status   Specimen Description   Final    BLOOD RIGHT HAND Performed at [facility] [address], [place], [place] [address]    Special Requests   Final    BOTTLES DRAWN AEROBIC ONLY Blood Culture results may not be optimal due to an inadequate volume of blood received in culture bottles Performed at [facility] [address], [place], [place] [address]    Culture   Final    NO GROWTH 4 DAYS Performed at [facility], [place] [address], [place], [place] [address]    Report Status PENDING  Incomplete     Radiology Studies: No results found.  Scheduled Meds:  glycopyrrolate  0.1 mg Intravenous  TID   sodium chloride flush  3 mL Intravenous Q12H   sodium chloride flush  5 mL Intracatheter Daily   Continuous Infusions:  HYDROmorphone 1 mg/hr (06/01/22 1119)   lactated ringers 1,000 mL with potassium chloride 40 mEq infusion Stopped (05/29/22 1301)     LOS: 6 days   Shelly Coss, MD [facility] P3/13/2024, 11:24 AM

## 2022-06-02 DIAGNOSIS — K831 Obstruction of bile duct: Secondary | ICD-10-CM | POA: Diagnosis not present

## 2022-06-02 LAB — CULTURE, BLOOD (ROUTINE X 2)
Culture: NO GROWTH
Culture: NO GROWTH

## 2022-06-13 ENCOUNTER — Other Ambulatory Visit: Payer: Self-pay | Admitting: Student in an Organized Health Care Education/Training Program

## 2022-06-20 NOTE — Progress Notes (Signed)
Family at bedside;  Support ongoing

## 2022-06-20 NOTE — Progress Notes (Signed)
PROGRESS NOTE  Eddie Mendoza  [id number] DOB: Apr 26, 1950 DOA: 06/14/2022 PCP: [facility]   Brief Narrative: Patient is a 72 year old male with history of BPH status post TURP, hypertension, alcohol abuse, recent diagnosis of malignant biliary stricture , biopsy and biliary stent placement who presented with weakness, lethargy for evaluation as per suggestion his gastroenterologist.  Patient was recently hospitalized from 2/21 until 2/27 during which he underwent ERCP with stent placement in the pancreatic duct and common bile duct.  Patient had severely elevated  bilirubin concerning for pancreatic malignancy, CA19-9 was severely elavated.Pathology was not conclusive.  On presentation, imaging findings showed multiple hepatic abscesses.  Started on broad-spectrum antibiotics, underwent percutaneous drainage of liver abscess.  Hospital course remarkable for severe encephalopathy.  Palliative care consulted for goals of care, care transitioned to comfort.Anticipated hospital death  Assessment & Plan:  Principal Problem:   Obstructive jaundice Active Problems:   Acute blood loss anemia   Biliary obstruction   Hepatic abscess   Acute metabolic encephalopathy   Elevated liver function tests   Biliary stricture   Paralytic ileus of large intestine (HCC)   Abnormal CT of the abdomen   Palliative care encounter   AKI (acute kidney injury) ([place])   Elevated bilirubin   Metastatic malignant neoplasm (HCC)   End of life care   Pain   DNR (do not resuscitate)   High risk medication use   Metabolic encephalopathy  Multiple liver abscesses/biliary collections, also jaundice with malignant stricture from suspected metastatic cancer, gram-positive bacteremia: Recent diagnosis of malignant biliary stricture on ERCP, biopsy and biliary stent placement.  Patient was recently hospitalized from 2/21 until 2/27 during which he underwent ERCP with stent placement in the pancreatic duct and  common bile duct.  T. bili of 14.8. patient had severe elevated  bilirubin concerning for pancreatic biopsy, pathology was not conclusive. CA19-9 elevated. On presentation, imaging findings showed multiple hepatic abscesses.  Started on broad-spectrum antibiotics, underwent percutaneous drainage of liver abscess.  Status post IR guided drainage Blood cultures showed mixed organisms strept  mitis/oralis, bacteroids fragilis.  Had leukocytosis with WBC count in the range of 20 K. ID was also consulted.Palliative care  following with comfort care approach. Currently on full comfort care, antibiotics discontinued  Acute on chronic anemia: Hemoglobin of 6.5 on presentation.  Received 3 units of PRBC.  Last hemoglobin stable.  Acute metabolic encephalopathy: Suspected to be from sepsis .CT head normal.  Chronic alcoholism.  Remains unresponisve  AKI: Given IV fluids.  Last creatinine 1.2  Severe protein calorie moderation: Albumin of less than 1.5.  Ileus: Distended abdomen, KUB with large amount of gas, no evidence of bowel obstruction.  GI was following.  Currently on comfort care  Goals of care: Long discussion done with his spouse on phone on 3/10.Marland Kitchen  His spouse and his daughter were  agreeable for full comfort care.  They understand the prognosis.Anticipated hospital death        DVT prophylaxis:SCDs Start: 06/03/2022 0748     Code Status: DNR  Family Communication:: Discussed with friends and family on 3/11 who were around him  Patient status: Inpatient  Patient is from : Home  Anticipated discharge to: anticipated hospital death     Consultants: GI, palliative care,ID  Procedures: Right liver abscess drain placement  Antimicrobials:  Anti-infectives (From admission, onward)    Start     Dose/Rate Route Frequency Ordered Stop   06/04/2022 1400  piperacillin-tazobactam (ZOSYN) IVPB 3.375 g  Status:  Discontinued        3.375 g 12.5 mL/hr over 240 Minutes Intravenous Every 8  hours 05/22/2022 0751 05/29/22 1259   05/25/2022 0730  piperacillin-tazobactam (ZOSYN) IVPB 3.375 g        3.375 g 100 mL/hr over 30 Minutes Intravenous  Once 06/01/2022 0729 05/29/2022 2030       Subjective: Patient seen and examined at bedside today.  He is on full comfort care.  Appears comfortable, got reported that he was agitated yesterday evening but not this morning.  Having shallow breathing, occasionally having apneic spells.  Objective: Vitals:   06/09/2022 0400 06/17/2022 0753 06/12/2022 0800 05/22/2022 0900  BP:  (!) 103/48    Pulse:      Resp: '14 14 14 17  '$ Temp:      TempSrc:      SpO2: (!) 84%     Weight:      Height:        Intake/Output Summary (Last 24 hours) at 05/31/2022 1051 Last data filed at 06/11/2022 0505 Gross per 24 hour  Intake 42.8 ml  Output 790 ml  Net -747.2 ml   Filed Weights   06/09/2022 1100 05/27/22 0446 05/30/22 0500  Weight: 89.2 kg 88.9 kg 88.7 kg    Examination:  General exam: Lying in bed, comfortable, unresponsive, bilateral lower EXTR edema, Foley catheter  Data Reviewed: I have personally reviewed following labs and imaging studies  CBC: Recent Labs  Lab 06/03/2022 1625 06/17/2022 2247 05/27/22 0323 05/27/22 1426 05/27/22 1427 05/28/22 0733 05/29/22 0329  WBC 21.3*  --  16.7*  --  19.3* 20.1* 20.5*  NEUTROABS 18.2*  --   --   --  16.3* 17.2* 18.1*  HGB 6.6*   < > 6.5* 9.7* 9.6* 10.5* 8.9*  HCT 19.3*   < > 18.8* 27.7* 28.2* 31.3* 25.6*  MCV 86.2  --  87.9  --  84.7 88.2 86.5  PLT 72*  --  65*  --  76* 69* 122*   < > = values in this interval not displayed.   Basic Metabolic Panel: Recent Labs  Lab 05/30/2022 1342 05/27/22 0323 05/27/22 1427 05/28/22 0733 05/29/22 0329  NA 137 133* 136 137 145  K 2.5* 3.1* 2.6* 3.6 3.9  CL 106 110 110 110 117*  CO2 18* 16* 17* 15* 16*  GLUCOSE 102* 125* 128* 104* 100*  BUN 77* 80* 78* 67* 55*  CREATININE 1.50* 1.64* 1.61* 1.04 1.25*  CALCIUM 7.4* 6.9* 7.4* 7.3* 7.6*  MG  --   --  3.5*  --    --   PHOS  --   --  4.5  --   --      Recent Results (from the past 240 hour(s))  Blood culture (routine x 2)     Status: Abnormal   Collection Time: 06/18/2022  5:20 AM   Specimen: BLOOD  Result Value Ref Range Status   Specimen Description   Final    BLOOD LEFT ANTECUBITAL Performed at [facility], [place] [address], [place],  [address]    Special Requests   Final    BOTTLES DRAWN AEROBIC AND ANAEROBIC Blood Culture results may not be optimal due to an excessive volume of blood received in culture bottles Performed at [facility] [address] [place], [place] [address]    Culture  Setup Time   Final    GRAM NEGATIVE RODS ANAEROBIC BOTTLE ONLY CRITICAL RESULT CALLED TO, READ BACK BY  AND VERIFIED WITH: PHARMD A. [facility] [id number] @ [address] [facility] GRAM POSITIVE COCCI IN CHAINS BOTTLES DRAWN AEROBIC ONLY CRITICAL VALUE NOTED.  VALUE IS CONSISTENT WITH PREVIOUSLY REPORTED AND CALLED VALUE.    Culture (A)  Final    STREPTOCOCCUS MITIS/ORALIS BACTEROIDES FRAGILIS BETA LACTAMASE POSITIVE Performed at [facility], [place] [address], [place], [place] [address]    Report Status 05/28/2022 FINAL  Final   Organism ID, Bacteria STREPTOCOCCUS MITIS/ORALIS  Final      Susceptibility   Streptococcus mitis/oralis - MIC*    TETRACYCLINE >=16 RESISTANT Resistant     VANCOMYCIN 0.5 SENSITIVE Sensitive     CLINDAMYCIN >=1 RESISTANT Resistant     * STREPTOCOCCUS MITIS/ORALIS  Blood Culture ID Panel (Reflexed)     Status: Abnormal   Collection Time: 06/18/2022  5:20 AM  Result Value Ref Range Status   Enterococcus faecalis NOT DETECTED NOT DETECTED Final   Enterococcus Faecium NOT DETECTED NOT DETECTED Final   Listeria monocytogenes NOT DETECTED NOT DETECTED Final   Staphylococcus species NOT DETECTED NOT DETECTED Final   Staphylococcus aureus (BCID) NOT DETECTED NOT DETECTED Final   Staphylococcus epidermidis NOT DETECTED NOT DETECTED Final    Staphylococcus lugdunensis NOT DETECTED NOT DETECTED Final   Streptococcus species DETECTED (A) NOT DETECTED Final    Comment: Not Enterococcus species, Streptococcus agalactiae, Streptococcus pyogenes, or Streptococcus pneumoniae. CRITICAL RESULT CALLED TO, READ BACK BY AND VERIFIED WITH: PHARMD A. Nelia Shi [id number] @ 2051 [facility]    Streptococcus agalactiae NOT DETECTED NOT DETECTED Final   Streptococcus pneumoniae NOT DETECTED NOT DETECTED Final   Streptococcus pyogenes NOT DETECTED NOT DETECTED Final   A.calcoaceticus-baumannii NOT DETECTED NOT DETECTED Final   Bacteroides fragilis DETECTED (A) NOT DETECTED Final    Comment: CRITICAL RESULT CALLED TO, READ BACK BY AND VERIFIED WITH: PHARMD A. [facility] [id number] @ 2051 [place]    Enterobacterales NOT DETECTED NOT DETECTED Final   Enterobacter cloacae complex NOT DETECTED NOT DETECTED Final   Escherichia coli NOT DETECTED NOT DETECTED Final   Klebsiella aerogenes NOT DETECTED NOT DETECTED Final   Klebsiella oxytoca NOT DETECTED NOT DETECTED Final   Klebsiella pneumoniae NOT DETECTED NOT DETECTED Final   Proteus species NOT DETECTED NOT DETECTED Final   Salmonella species NOT DETECTED NOT DETECTED Final   Serratia marcescens NOT DETECTED NOT DETECTED Final   Haemophilus influenzae NOT DETECTED NOT DETECTED Final   Neisseria meningitidis NOT DETECTED NOT DETECTED Final   Pseudomonas aeruginosa NOT DETECTED NOT DETECTED Final   Stenotrophomonas maltophilia NOT DETECTED NOT DETECTED Final   Candida albicans NOT DETECTED NOT DETECTED Final   Candida auris NOT DETECTED NOT DETECTED Final   Candida glabrata NOT DETECTED NOT DETECTED Final   Candida krusei NOT DETECTED NOT DETECTED Final   Candida parapsilosis NOT DETECTED NOT DETECTED Final   Candida tropicalis NOT DETECTED NOT DETECTED Final   Cryptococcus neoformans/gattii NOT DETECTED NOT DETECTED Final    Comment: Performed at [facility], [place] [address], [place], [place] [address]  Blood  culture (routine x 2)     Status: Abnormal   Collection Time: 06/03/2022  5:37 AM   Specimen: BLOOD LEFT FOREARM  Result Value Ref Range Status   Specimen Description BLOOD LEFT FOREARM  Final   Special Requests   Final    BOTTLES DRAWN AEROBIC AND ANAEROBIC Blood Culture adequate volume   Culture  Setup Time   Final    GRAM POSITIVE COCCI IN CHAINS IN BOTH AEROBIC AND ANAEROBIC  BOTTLES CRITICAL RESULT CALLED TO, READ BACK BY AND VERIFIED WITH: PHARMD A. Nelia Shi [id number] @ 2051 [facility]    Culture (A)  Final    STREPTOCOCCUS MITIS/ORALIS SUSCEPTIBILITIES PERFORMED ON PREVIOUS CULTURE WITHIN THE LAST 5 DAYS.    Report Status 05/28/2022 FINAL  Final  Blood Culture ID Panel (Reflexed)     Status: Abnormal   Collection Time: 06/19/2022  5:37 AM  Result Value Ref Range Status   Enterococcus faecalis NOT DETECTED NOT DETECTED Final   Enterococcus Faecium NOT DETECTED NOT DETECTED Final   Listeria monocytogenes NOT DETECTED NOT DETECTED Final   Staphylococcus species NOT DETECTED NOT DETECTED Final   Staphylococcus aureus (BCID) NOT DETECTED NOT DETECTED Final   Staphylococcus epidermidis NOT DETECTED NOT DETECTED Final   Staphylococcus lugdunensis NOT DETECTED NOT DETECTED Final   Streptococcus species DETECTED (A) NOT DETECTED Final    Comment: Not Enterococcus species, Streptococcus agalactiae, Streptococcus pyogenes, or Streptococcus pneumoniae. CRITICAL RESULT CALLED TO, READ BACK BY AND VERIFIED WITH: PHARMD A. Nelia Shi [id number] @ 2051 [facility]    Streptococcus agalactiae NOT DETECTED NOT DETECTED Final   Streptococcus pneumoniae NOT DETECTED NOT DETECTED Final   Streptococcus pyogenes NOT DETECTED NOT DETECTED Final   A.calcoaceticus-baumannii NOT DETECTED NOT DETECTED Final   Bacteroides fragilis DETECTED (A) NOT DETECTED Final    Comment: CRITICAL RESULT CALLED TO, READ BACK BY AND VERIFIED WITH: PHARMD A. [facility] [id number] @ 2051 [place]    Enterobacterales NOT DETECTED NOT DETECTED Final    Enterobacter cloacae complex NOT DETECTED NOT DETECTED Final   Escherichia coli NOT DETECTED NOT DETECTED Final   Klebsiella aerogenes NOT DETECTED NOT DETECTED Final   Klebsiella oxytoca NOT DETECTED NOT DETECTED Final   Klebsiella pneumoniae NOT DETECTED NOT DETECTED Final   Proteus species NOT DETECTED NOT DETECTED Final   Salmonella species NOT DETECTED NOT DETECTED Final   Serratia marcescens NOT DETECTED NOT DETECTED Final   Haemophilus influenzae NOT DETECTED NOT DETECTED Final   Neisseria meningitidis NOT DETECTED NOT DETECTED Final   Pseudomonas aeruginosa NOT DETECTED NOT DETECTED Final   Stenotrophomonas maltophilia NOT DETECTED NOT DETECTED Final   Candida albicans NOT DETECTED NOT DETECTED Final   Candida auris NOT DETECTED NOT DETECTED Final   Candida glabrata NOT DETECTED NOT DETECTED Final   Candida krusei NOT DETECTED NOT DETECTED Final   Candida parapsilosis NOT DETECTED NOT DETECTED Final   Candida tropicalis NOT DETECTED NOT DETECTED Final   Cryptococcus neoformans/gattii NOT DETECTED NOT DETECTED Final    Comment: Performed at [facility], [place] [address], [place], [place] [address]  MRSA Next Gen by PCR, Nasal     Status: None   Collection Time: 06/10/2022  9:37 AM   Specimen: Nasal Mucosa; Nasal Swab  Result Value Ref Range Status   MRSA by PCR Next Gen NOT DETECTED NOT DETECTED Final    Comment: (NOTE) The GeneXpert MRSA Assay (FDA approved for NASAL specimens only), is one component of a comprehensive MRSA colonization surveillance program. It is not intended to diagnose MRSA infection nor to guide or monitor treatment for MRSA infections. Test performance is not FDA approved in patients less than 69 years old. Performed at [facility], [place] [address], [place], [place] [address]   Aerobic/Anaerobic Culture w Gram Stain (surgical/deep wound)     Status: None   Collection Time: 05/28/2022  1:00 PM   Specimen: Liver; Abscess  Result  Value Ref Range Status   Specimen Description   Final  LIVER Performed at [facility], [place] [address] [place], [place] [address]    Special Requests   Final    NONE Performed at [facility], [place] [address] [place], [place] [address]    Gram Stain   Final    NO WBC SEEN ABUNDANT GRAM NEGATIVE RODS ABUNDANT GRAM POSITIVE COCCI IN CHAINS    Culture   Final    ABUNDANT STREPTOCOCCUS PARASANGUINIS ABUNDANT STAPHYLOCOCCUS HAEMOLYTICUS ABUNDANT BACTEROIDES FRAGILIS BETA LACTAMASE NEGATIVE Performed at [facility], [place] [address] [place], [place] [address]    Report Status 05/30/2022 FINAL  Final   Organism ID, Bacteria STAPHYLOCOCCUS HAEMOLYTICUS  Final   Organism ID, Bacteria STREPTOCOCCUS PARASANGUINIS  Final      Susceptibility   Streptococcus parasanguinis - MIC*    PENICILLIN 0.12 SENSITIVE Sensitive     CEFTRIAXONE <=0.12 SENSITIVE Sensitive     ERYTHROMYCIN >=8 RESISTANT Resistant     LEVOFLOXACIN 8 RESISTANT Resistant     VANCOMYCIN 0.5 SENSITIVE Sensitive     * ABUNDANT STREPTOCOCCUS PARASANGUINIS   Staphylococcus haemolyticus - MIC*    CIPROFLOXACIN >=8 RESISTANT Resistant     ERYTHROMYCIN >=8 RESISTANT Resistant     GENTAMICIN 2 SENSITIVE Sensitive     OXACILLIN >=4 RESISTANT Resistant     TETRACYCLINE <=1 SENSITIVE Sensitive     VANCOMYCIN <=0.5 SENSITIVE Sensitive     TRIMETH/SULFA >=320 RESISTANT Resistant     CLINDAMYCIN RESISTANT Resistant     RIFAMPIN <=0.5 SENSITIVE Sensitive     Inducible Clindamycin POSITIVE Resistant     * ABUNDANT STAPHYLOCOCCUS HAEMOLYTICUS  Culture, blood (Routine X 2) w Reflex to ID Panel     Status: None (Preliminary result)   Collection Time: 05/28/22  7:55 AM   Specimen: BLOOD RIGHT ARM  Result Value Ref Range Status   Specimen Description   Final    BLOOD RIGHT ARM Performed at [facility] [address], [place], [place] [address]    Special Requests    Final    BOTTLES DRAWN AEROBIC ONLY Blood Culture results may not be optimal due to an inadequate volume of blood received in culture bottles Performed at [facility] [address], [place], [place] [address]    Culture   Final    NO GROWTH 4 DAYS Performed at [facility], [place] [address], [place], [place] [address]    Report Status PENDING  Incomplete  Culture, blood (Routine X 2) w Reflex to ID Panel     Status: None (Preliminary result)   Collection Time: 05/28/22  7:56 AM   Specimen: BLOOD RIGHT HAND  Result Value Ref Range Status   Specimen Description   Final    BLOOD RIGHT HAND Performed at [facility] [address], [place], [place] [address]    Special Requests   Final    BOTTLES DRAWN AEROBIC ONLY Blood Culture results may not be optimal due to an inadequate volume of blood received in culture bottles Performed at [facility] [address], [place], [place] [address]    Culture   Final    NO GROWTH 4 DAYS Performed at [facility], [place] [address], [place], [place] [address]    Report Status PENDING  Incomplete     Radiology Studies: No results found.  Scheduled Meds:  glycopyrrolate  0.1 mg Intravenous TID   sodium chloride flush  3 mL Intravenous Q12H  sodium chloride flush  5 mL Intracatheter Daily   Continuous Infusions:  HYDROmorphone 2 mg/hr (06/19/2022 0954)   lactated ringers 1,000 mL with potassium chloride 40 mEq infusion Stopped (05/29/22 1301)     LOS: 7 days   Shelly Coss, MD [facility] P3/14/2024, 10:52 AM

## 2022-06-20 NOTE — Progress Notes (Signed)
25m of Dilaudid wasted by this RN and LShelby MattocksRN in med room

## 2022-06-20 NOTE — Discharge Summary (Signed)
Death Summary  Eddie Mendoza [id number] DOB: 1950/03/29 DOA: Jun 18, 2022  PCP: [facility]  Admit date: 2022/06/18 Date of Death: 07/02/22 Time of Death: Jul 12, 1840  History of present illness:   Patient is a 72 year old male with history of BPH status post TURP, hypertension, alcohol abuse, recent diagnosis of malignant biliary stricture , biopsy and biliary stent placement who presented with weakness, lethargy for evaluation as per suggestion his gastroenterologist. Patient was recently hospitalized from 2/21 until 2/27 during which he underwent ERCP with stent placement in the pancreatic duct and common bile duct. Patient had severely elevated bilirubin concerning for pancreatic malignancy, CA19-9 was severely elavated.Pathology was not conclusive. On presentation, imaging findings showed multiple hepatic abscesses. Started on broad-spectrum antibiotics, underwent percutaneous drainage of liver abscess. Hospital course remarkable for severe encephalopathy. Palliative care consulted for goals of care, care transitioned to comfort. He passed away peacefully on 07/02/2022.   The results of significant diagnostics from this hospitalization (including imaging, microbiology, ancillary and laboratory) are listed below for reference.    Significant Diagnostic Studies: DG Abd 1 View  Result Date: 05/29/2022 CLINICAL DATA:  72 year old male with history of abdominal distension. EXAM: ABDOMEN - 1 VIEW COMPARISON:  Abdominal radiograph 05/28/2022. FINDINGS: Pigtail drainage catheter projecting over the right upper quadrant of the abdomen again noted. Biliary drainage catheter in the expected location of the common bile duct projecting over the right upper quadrant of the abdomen. Some gas is noted within multiple nondilated loops of small bowel. No pathologic dilatation of small bowel. Gas and stool noted throughout the colon and rectum. No pneumoperitoneum. IMPRESSION: 1. Support apparatus, as  above. 2. Nonspecific, nonobstructive bowel gas pattern. Electronically Signed   By: Vinnie Langton M.D.   On: 05/29/2022 10:05   ECHOCARDIOGRAM COMPLETE  Result Date: 05/28/2022    ECHOCARDIOGRAM REPORT   Patient Name:   Eddie Mendoza Date of Exam: 05/28/2022 Medical Rec #:  [id number]        Height:       72.0 in Accession #:    [id number]       Weight:       196.0 lb Date of Birth:  09/11/1950         BSA:          2.112 m Patient Age:    57 years         BP:           125/65 mmHg Patient Gender: M                HR:           79 bpm. Exam Location:  Inpatient Procedure: 2D Echo, Color Doppler and Cardiac Doppler Indications:    bacteremia  History:        Patient has no prior history of Echocardiogram examinations.                 Risk Factors:Hypertension and Current Smoker.  Sonographer:    Johny Chess RDCS Referring Phys: [id number] [facility]  Sonographer Comments: Image acquisition challenging due to uncooperative patient. IMPRESSIONS  1. LV false tendon noted in apex. Left ventricular ejection fraction, by estimation, is 55 to 60%. The left ventricle has normal function. The left ventricle has no regional wall motion abnormalities. Left ventricular diastolic parameters were normal.  2. Right ventricular systolic function is normal. The right ventricular size is normal. There is normal pulmonary artery systolic pressure.  3. The mitral valve is grossly normal.  No evidence of mitral valve regurgitation. No evidence of mitral stenosis.  4. The aortic valve was not well visualized. Aortic valve regurgitation is not visualized. No aortic stenosis is present.  5. The inferior vena cava is normal in size with greater than 50% respiratory variability, suggesting right atrial pressure of 3 mmHg. Comparison(s): No prior Echocardiogram. Conclusion(s)/Recommendation(s): No evidence of valvular vegetations on this transthoracic echocardiogram. Consider a transesophageal echocardiogram to exclude infective  endocarditis if clinically indicated. FINDINGS  Left Ventricle: LV false tendon noted in apex. Left ventricular ejection fraction, by estimation, is 55 to 60%. The left ventricle has normal function. The left ventricle has no regional wall motion abnormalities. The left ventricular internal cavity size was normal in size. There is no left ventricular hypertrophy. Left ventricular diastolic parameters were normal. Right Ventricle: The right ventricular size is normal. No increase in right ventricular wall thickness. Right ventricular systolic function is normal. There is normal pulmonary artery systolic pressure. The tricuspid regurgitant velocity is 2.39 m/s, and  with an assumed right atrial pressure of 3 mmHg, the estimated right ventricular systolic pressure is 0000000 mmHg. Left Atrium: Patent left atrial appendage. Left atrial size was normal in size. Right Atrium: Right atrial size was normal in size. Pericardium: There is no evidence of pericardial effusion. Mitral Valve: The mitral valve is grossly normal. No evidence of mitral valve regurgitation. No evidence of mitral valve stenosis. Tricuspid Valve: The tricuspid valve is normal in structure. Tricuspid valve regurgitation is not demonstrated. No evidence of tricuspid stenosis. Aortic Valve: The aortic valve was not well visualized. Aortic valve regurgitation is not visualized. No aortic stenosis is present. Pulmonic Valve: The pulmonic valve was not well visualized. Pulmonic valve regurgitation is not visualized. Aorta: The aortic root and ascending aorta are structurally normal, with no evidence of dilitation. Venous: The inferior vena cava is normal in size with greater than 50% respiratory variability, suggesting right atrial pressure of 3 mmHg. IAS/Shunts: No atrial level shunt detected by color flow Doppler.  LEFT VENTRICLE PLAX 2D LVIDd:         5.20 cm   Diastology LVIDs:         3.80 cm   LV e' medial:    7.51 cm/s LV PW:         0.90 cm   LV E/e'  medial:  10.7 LV IVS:        0.80 cm   LV e' lateral:   10.30 cm/s LVOT diam:     2.30 cm   LV E/e' lateral: 7.8 LV SV:         93 LV SV Index:   44 LVOT Area:     4.15 cm  RIGHT VENTRICLE             IVC RV Basal diam:  2.90 cm     IVC diam: 1.40 cm RV S prime:     16.20 cm/s TAPSE (M-mode): 2.7 cm LEFT ATRIUM             Index        RIGHT ATRIUM           Index LA diam:        3.90 cm 1.85 cm/m   RA Area:     14.10 cm LA Vol (A2C):   58.9 ml 27.88 ml/m  RA Volume:   30.00 ml  14.20 ml/m LA Vol (A4C):   65.5 ml 31.01 ml/m LA Biplane Vol: 66.0 ml 31.25 ml/m  AORTIC VALVE LVOT Vmax:   120.00 cm/s LVOT Vmean:  76.500 cm/s LVOT VTI:    0.223 m  AORTA Ao Root diam: 3.40 cm Ao Asc diam:  3.10 cm MITRAL VALVE                TRICUSPID VALVE MV Area (PHT): 4.21 cm     TR Peak grad:   22.8 mmHg MV Decel Time: 180 msec     TR Vmax:        239.00 cm/s MV E velocity: 80.20 cm/s MV A velocity: 118.00 cm/s  SHUNTS MV E/A ratio:  0.68         Systemic VTI:  0.22 m                             Systemic Diam: 2.30 cm Rudean Haskell MD Electronically signed by Rudean Haskell MD Signature Date/Time: 05/28/2022/2:35:38 PM    Final    DG Abd 1 View  Result Date: 05/28/2022 CLINICAL DATA:  Abdominal distension EXAM: ABDOMEN - 1 VIEW COMPARISON:  05/28/2022 FINDINGS: Two supine frontal views of the abdomen and pelvis are obtained. The biliary and pancreatic stents seen previously are again identified. Pigtail drainage catheter overlies the liver. No evidence of bowel obstruction. Moderate gaseous distention of the colon could reflect ileus. There are no masses or abnormal calcifications. Lung bases are clear. IMPRESSION: 1. Support devices as above. 2. Moderate gaseous distention of the colon which could reflect colonic ileus. No evidence of bowel obstruction. Electronically Signed   By: Randa Ngo M.D.   On: 05/28/2022 09:29   IR US Guide Bx Asp/Drain  Result Date: 06/04/2022 INDICATION: HEPATIC ABSCESS,  OBSTRUCTIVE JAUNDICE EXAM: ULTRASOUND RIGHT HEPATIC ABSCESS DRAIN PLACEMENT MEDICATIONS: The patient is currently admitted to the hospital and receiving intravenous antibiotics. The antibiotics were administered within an appropriate time frame prior to the initiation of the procedure. ANESTHESIA/SEDATION: Moderate (conscious) sedation was employed during this procedure. A total of Versed 0.5 mg and Fentanyl 50 mcg was administered intravenously by the radiology nurse. Total intra-service moderate Sedation Time: 10 minutes. The patient's level of consciousness and vital signs were monitored continuously by radiology nursing throughout the procedure under my direct supervision. COMPLICATIONS: None immediate. PROCEDURE: Informed written consent was obtained from the patient after a thorough discussion of the procedural risks, benefits and alternatives. All questions were addressed. Maximal Sterile Barrier Technique was utilized including caps, mask, sterile gowns, sterile gloves, sterile drape, hand hygiene and skin antiseptic. A timeout was performed prior to the initiation of the procedure. previous imaging reviewed. preliminary ultrasound performed. the posterior right hepatic abscess was localized and marked through a lower intercostal space in the mid axillary line. Under sterile conditions and local anesthesia, percutaneous needle access performed of the inferior right hepatic abscess with an 18 gauge 15 cm needle. Needle position confirmed with ultrasound. Images obtained for documentation. There was return of exudative bile compatible with abscess or infected biloma. Guidewire inserted. Guidewire position confirmed with ultrasound. Tract dilatation performed to insert a 10 [place] drain. Drain catheter position confirmed with ultrasound. Syringe aspiration yielded 115 cc exudative bilious fluid. Catheter secured with a silk suture connected to external suction bulb. Sterile dressing applied. No immediate  complication. Patient tolerated the procedure well. IMPRESSION: Successful ultrasound-guided right hepatic abscess drainage catheter placement. Electronically Signed   By: Jerilynn Mages.  Shick M.D.   On: 06/01/2022 13:57   CT ABDOMEN PELVIS WO CONTRAST  Result  Date: 05/30/2022 CLINICAL DATA:  Abdominal pain, weakness and jaundice. EXAM: CT ABDOMEN AND PELVIS WITHOUT CONTRAST TECHNIQUE: Multidetector CT imaging of the abdomen and pelvis was performed following the standard protocol without IV contrast. RADIATION DOSE REDUCTION: This exam was performed according to the departmental dose-optimization program which includes automated exposure control, adjustment of the mA and/or kV according to patient size and/or use of iterative reconstruction technique. COMPARISON:  CT with IV contrast 05/16/2022, 05/11/2022, and 03/21/2022. FINDINGS: Lower chest: Development of bilateral trace pleural effusions. Mild increased posterior atelectasis. No acute process. Bilateral gynecomastia. The cardiac size is normal. The cardiac blood pool is less dense than the myocardium consistent with anemia. Hepatobiliary: Again noted are common bile duct stent and a small caliber stent in the proximal pancreatic duct both terminating in the duodenal. The gallbladder is fluid-filled but not dilated, unchanged with again noted thickened appearance to the gallbladder neck which is ill-defined and may infiltrate the adjacent liver. Severe intrahepatic biliary dilatation continues to be seen. There is interval development of a large heterogeneous fluid collection in the right lobe of the liver in segment 7 measuring 7.8 x 6.9 by 8.7 cm, concerning for hepatic abscess since it is unlikely a metastasis this large would have developed in 10 days. There is a small new fluid collection in segment 4B on 3:25 measuring 2.2 cm which could also be an abscess. There is also a new nonspecific hypodensity in segment 5 on 3:34 measuring 1.7 cm, and another in the more  anterior aspect of segment 5 measuring 2.2 cm on 3:33. I suspect these are probably also fluid collections although it is possible they could be metastases. No other new abnormality in the liver is seen. Pancreas: Changes of proximal to mid peripancreatic edema are somewhat increased today but there is generalized increased mesenteric edema elsewhere as well. 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 A 3 cm hypodense lesion in the pancreatic head appears similar. No downstream ductal dilatation is visible. Small caliber stent in the proximal pancreatic duct is similar in positioning. Spleen: Increased splenomegaly, 15 cm coronal, previously 14.1 cm coronal. No focal abnormality without contrast. Adrenals/Urinary Tract: No adrenal or renal masses seen without contrast. No urinary stone or hydronephrosis. Large heterogeneous mass in the bladder continues to be seen. Stomach/Bowel: The stomach and small-bowel show no obvious abnormality. The appendix is normal caliber. There is wall thickening in the ascending, transverse and descending colon concerning for colitis, scattered inflammatory changes over portions suspected. Vascular/Lymphatic: Stable retroperitoneal and mesenteric root adenopathy. No new adenopathy. Aortic atherosclerosis. Reproductive: Severe prostatomegaly. Other: Mild but interval increased ascites in the abdomen and pelvis. Increased generalized mesenteric edema and increased body wall anasarca. No free air. Musculoskeletal: No new or acute osseous findings. No destructive lesions. IMPRESSION: 1. Interval development of a large heterogeneous fluid collection in the right lobe of the liver in segment 7 measuring 7.8 x 6.9 x 8.7 cm, concerning for hepatic abscess since it is unlikely a metastasis this large would have developed in 10 days. 2. There is a new 2.2 cm fluid collection in segment 4B on 3:25 which could also be an abscess. 3. There are 2 additional nonspecific hypodensities in the  right lobe of the liver measuring 1.7 cm and 2.2 cm. I suspect these are also fluid collections although it is possible they could be metastases. MRI without and with contrast may be helpful. 4. Persistent severe intrahepatic biliary dilatation with common bile duct stent and small caliber stent in the proximal  pancreatic duct. 5. Increased peripancreatic edema and generalized mesenteric edema, increased body wall anasarca and mild interval increased abdominopelvic ascites. 6. Stable 3 cm hypodense lesion in the pancreatic head. 7. Stable retroperitoneal and mesenteric root adenopathy. 8. New trace pleural effusions. 9. Increased splenomegaly. 10. Large heterogeneous mass in the bladder continues to be seen. 11. Diffuse colonic wall thickening concerning for colitis, scattered inflammatory changes over portions suspected. Otherwise possible this could be congestive or due to hepatic dysfunction. 12. Severe prostatomegaly. 13. Aortic atherosclerosis. Aortic Atherosclerosis (ICD10-I70.0). Electronically Signed   By: Telford Nab M.D.   On: 06/17/2022 07:21   CT Head Wo Contrast  Result Date: 06/08/2022 CLINICAL DATA:  72 year old male with altered mental status. Increased weakness, jaundice, abdominal pain. EXAM: CT HEAD WITHOUT CONTRAST TECHNIQUE: Contiguous axial images were obtained from the base of the skull through the vertex without intravenous contrast. RADIATION DOSE REDUCTION: This exam was performed according to the departmental dose-optimization program which includes automated exposure control, adjustment of the mA and/or kV according to patient size and/or use of iterative reconstruction technique. COMPARISON:  None Available. FINDINGS: Brain: Cerebral volume is within normal limits for age. No midline shift, ventriculomegaly, mass effect, evidence of mass lesion, intracranial hemorrhage or evidence of cortically based acute infarction. Gray-white matter differentiation is within normal limits  throughout the brain. No encephalomalacia identified. Vascular: Mild Calcified atherosclerosis at the skull base. No suspicious intracranial vascular hyperdensity. Skull: No acute osseous abnormality identified. Sinuses/Orbits: Pronounced maxillary sinus mucoperiosteal thickening with superimposed fluid level and bubbly opacity. Other paranasal sinuses are fairly well aerated, only mild mucosal thickening elsewhere. Tympanic cavities and mastoids are clear. Other: Visualized orbits and scalp soft tissues are within normal limits. IMPRESSION: 1. Normal for age non contrast CT appearance of the brain. 2. 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 Electronically Signed   By: Genevie Ann M.D.   On: 06/13/2022 06:43   DG Chest Portable 1 View  Result Date: 05/24/2022 CLINICAL DATA:  Altered mental status, jaundice and weakness. EXAM: PORTABLE CHEST 1 VIEW COMPARISON:  PA chest 07/22/2010 FINDINGS: The lungs are expiratory with limited view of the bases. The visualized lungs are clear. The sulci are sharp. Heart size and vasculature are normal for the low lung volumes. The mediastinum is normally outlined. There is thoracic spondylosis. IMPRESSION: Expiratory exam with limited view of the bases. No evidence of acute cardiopulmonary disease within study limitations. Electronically Signed   By: Telford Nab M.D.   On: 06/18/2022 06:27   VAS [place] LOWER EXTREMITY VENOUS (DVT)  Result Date: 05/16/2022  Lower Venous DVT Study Patient Name:  ORONDE KEMPEL  Date of Exam:   05/16/2022 Medical Rec #: [id number]         Accession #:    [id number] Date of Birth: October 13, 1950          Patient Gender: M Patient Age:   25 years Exam Location:  [facility] Procedure:      VAS [place] LOWER EXTREMITY VENOUS (DVT) Referring Phys: Bonnielee Haff --------------------------------------------------------------------------------  Indications: Edema.  Limitations: Poor ultrasound/tissue interface. Comparison Study: No previous exams Performing  Technologist: Jody Hill RVT, RDMS  Examination Guidelines: A complete evaluation includes B-mode imaging, spectral Doppler, color Doppler, and power Doppler as needed of all accessible portions of each vessel. Bilateral testing is considered an integral part of a complete examination. Limited examinations for reoccurring indications may be performed as noted. The reflux portion of the exam is performed with the patient in reverse Trendelenburg.  +---------+---------------+---------+-----------+----------+--------------+  RIGHT    CompressibilityPhasicitySpontaneityPropertiesThrombus Aging +---------+---------------+---------+-----------+----------+--------------+ CFV      Full           No       Yes                                 +---------+---------------+---------+-----------+----------+--------------+ SFJ      Full                                                        +---------+---------------+---------+-----------+----------+--------------+ FV Prox  Full           Yes      Yes                                 +---------+---------------+---------+-----------+----------+--------------+ FV Mid   Full           Yes      Yes                                 +---------+---------------+---------+-----------+----------+--------------+ FV DistalFull           Yes      Yes                                 +---------+---------------+---------+-----------+----------+--------------+ PFV      Full                                                        +---------+---------------+---------+-----------+----------+--------------+ POP      Full           Yes      Yes                                 +---------+---------------+---------+-----------+----------+--------------+ PTV      Full                                                        +---------+---------------+---------+-----------+----------+--------------+ PERO     Full                                                         +---------+---------------+---------+-----------+----------+--------------+   +---------+---------------+---------+-----------+----------+--------------+ LEFT     CompressibilityPhasicitySpontaneityPropertiesThrombus Aging +---------+---------------+---------+-----------+----------+--------------+ CFV      Full           Yes      Yes                                 +---------+---------------+---------+-----------+----------+--------------+  SFJ      Full                                                        +---------+---------------+---------+-----------+----------+--------------+ FV Prox  Full           Yes      Yes                                 +---------+---------------+---------+-----------+----------+--------------+ FV Mid   Full           Yes      Yes                                 +---------+---------------+---------+-----------+----------+--------------+ FV DistalFull           Yes      Yes                                 +---------+---------------+---------+-----------+----------+--------------+ PFV      Full                                                        +---------+---------------+---------+-----------+----------+--------------+ POP      Full           Yes      Yes                                 +---------+---------------+---------+-----------+----------+--------------+ PTV      Full                                                        +---------+---------------+---------+-----------+----------+--------------+ PERO     Full                                                        +---------+---------------+---------+-----------+----------+--------------+     Summary: BILATERAL: - No evidence of deep vein thrombosis seen in the lower extremities, bilaterally. -No evidence of popliteal cyst, bilaterally.   *See table(s) above for measurements and observations. Electronically signed by Servando Snare MD on  05/16/2022 at 5:18:19 PM.    Final    CT ABDOMEN PELVIS W CONTRAST  Result Date: 05/16/2022 CLINICAL DATA:  Biliary stent deployment with elevated white count, difficult ERCP. EXAM: CT ABDOMEN AND PELVIS WITH CONTRAST TECHNIQUE: Multidetector CT imaging of the abdomen and pelvis was performed using the standard protocol following bolus administration of intravenous contrast. RADIATION DOSE REDUCTION: This exam was performed according to the departmental dose-optimization program which includes automated exposure control, adjustment of the mA and/or kV according to patient size and/or  use of iterative reconstruction technique. CONTRAST:  49mL OMNIPAQUE IOHEXOL 350 MG/ML SOLN COMPARISON:  CT with IV contrast 05/11/2022, CT with IV contrast 03/21/2022. FINDINGS: Lower chest: No acute abnormality. Hepatobiliary: Respiratory motion limits image quality as well as artifact from the patient's arms in the field. Gallbladder is fluid-filled but again not dilated. Ill-defined thickening in the gallbladder neck is again noted on 3: 30-33. Similar to the last CT there is severe intrahepatic biliary dilatation, with no appreciable improvement despite interval stent placement from the intrahepatic ductal confluence through the CBD into the duodenum. A short wire or small caliber stent extends from the CBD stent into the proximal pancreatic duct. There is scattered air in the dilated bile ducts which could be due to the ERCP and stent placement or infectious process. No liver mass or hepatic abscess is seen. Pancreas: Allowing for respiratory motion there is probably increased proximal peripancreatic edema, concerning for pancreatitis. A short wire or in proximal pancreatic duct is noted. There is no downstream ductal dilatation. 3 x 2.5 cm cystic lesion again is noted of the pancreatic head. Spleen: Mildly prominent measuring 14.1 cm coronal, previously 13.5 cm coronal. No mass or abscess. Adrenals/Urinary Tract: No adrenal or  renal mass is seen, no urinary stone or obstruction. There is symmetric delayed phase renal excretion. There is a large heterogeneous mass redemonstrated in the bladder. There is elsewhere mild generalized thickening of the bladder. Stomach/Bowel: No bowel obstruction or inflammatory change is seen. The appendix is normal. Vascular/Lymphatic: Stable retroperitoneal adenopathy, largest index lymph node in the left periaortic chain again measuring 2 cm in short axis. Aortic atherosclerosis. Reproductive: Severe prostatomegaly. Other: Mild free ascites in the abdomen and pelvis.  No free air. Musculoskeletal: No new findings. IMPRESSION: 1. Similar to the last CT there is severe intrahepatic biliary dilatation, but with interval stent placement from the intrahepatic ductal confluence through the CBD into the duodenum. There is scattered air in the dilated bile ducts which could be due to the ERCP and stent placement or infectious process. Ill-defined thickening in the gallbladder neck appears similar. 2. Allowing for respiratory motion, there is probably increased proximal peripancreatic edema, concerning for pancreatitis. 3. 3 x 2.5 cm cystic lesion of the pancreatic head, unchanged. 4. Large heterogeneous bladder mass redemonstrated. 5. Mild free fluid in the abdomen and pelvis. 6. Severe prostatomegaly. 7. Stable retroperitoneal adenopathy. 8. Aortic atherosclerosis. Aortic Atherosclerosis (ICD10-I70.0). Electronically Signed   By: Telford Nab M.D.   On: 05/16/2022 02:56   DG ERCP  Result Date: 05/14/2022 CLINICAL DATA:  72 year old male with history of biliary obstruction. EXAM: ERCP TECHNIQUE: Multiple spot images obtained with the fluoroscopic device and submitted for interpretation post-procedure. FLUOROSCOPY TIME:  81.7 mGy COMPARISON:  CT abdomen pelvis from 05/11/2022 FINDINGS: Retrograde cannulation of the common bile duct into the intrahepatic ducts and cholangiogram demonstrates severe intrahepatic  biliary duct dilation with obstructive hilar mass. A plastic biliary stent is placed. IMPRESSION: 1. Severe biliary obstruction at the level of the hilum. 2. Common bile duct stent was placed. These images were submitted for radiologic interpretation only. Please see the procedural report for the full procedural details, amount of contrast, and the fluoroscopy time utilized. Ruthann Cancer, MD [facility] Electronically Signed   By: Ruthann Cancer M.D.   On: 05/14/2022 08:06   CT ABDOMEN PELVIS W CONTRAST  Result Date: 05/12/2022 CLINICAL DATA:  Biliary obstruction suspected (Ped 0-17y) EXAM: CT ABDOMEN AND PELVIS WITH CONTRAST TECHNIQUE: Multidetector  CT imaging of the abdomen and pelvis was performed using the standard protocol following bolus administration of intravenous contrast. RADIATION DOSE REDUCTION: This exam was performed according to the departmental dose-optimization program which includes automated exposure control, adjustment of the mA and/or kV according to patient size and/or use of iterative reconstruction technique. CONTRAST:  43mL OMNIPAQUE IOHEXOL 350 MG/ML SOLN COMPARISON:  CT abdomen pelvis 03/21/2022 FINDINGS: Lower chest: No acute abnormality. Hepatobiliary: No focal liver abnormality. Poorly visualized gallbladder with persistent gallbladder wall thickening around the neck (3:24). Interval worsening of severe intra and extrahepatic biliary ductal dilatation. Pancreas: Persistent stable 2.9 x 2.4 cm proximal pancreatic duct hypodense lesion. Slightly hazy pancreatic contour proximally with trace fat stranding. No main pancreatic ductal dilatation. Spleen: Normal in size without focal abnormality. Adrenals/Urinary Tract: No adrenal nodule bilaterally. Bilateral kidneys enhance symmetrically. No hydronephrosis. No hydroureter. Redemonstration of a heterogeneous masslike intraluminal lesion within the urinary bladder. Persistent urine  bladder wall thickening. On delayed imaging, there is no urothelial wall thickening and there are no filling defects in the opacified portions of the bilateral collecting systems or ureters. Stomach/Bowel: Stomach is within normal limits. No evidence of small bowel wall thickening or dilatation. No large bowel dilatation. Diffuse large bowel wall thickening. No pericolonic fat stranding. Scattered colonic diverticula. Appendix appears normal. Vascular/Lymphatic: No abdominal aorta or iliac aneurysm. Mild atherosclerotic plaque of the aorta and its branches. Persistent retroperitoneal lymphadenopathy as an example a 1.8 cm left periaortic lymph node (3:34). Difficult to delineate suggestion of portacaval lymphadenopathy (3:18). Reproductive: The prostate is enlarged and heterogeneous measuring up to 7.3 cm. Other: Interval increase in trace simple fluid ascites. No intraperitoneal free gas. No organized fluid collection. Musculoskeletal: No abdominal wall hernia or abnormality. No suspicious lytic or blastic osseous lesions. No acute displaced fracture. Multilevel degenerative changes of the spine. IMPRESSION: 1. Question development of acute pancreatitis. Correlate with lipase levels. 2. Proximal pancreatic 2.9 cm cystic mass with interval worsening of severe intra and extrahepatic biliary ductal dilatation. Persistent poorly visualized gallbladder with gallbladder neck thickening. 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 When the patient is clinically stable and able to follow directions and hold their breath (preferably as an outpatient) further evaluation with dedicated MRI with and without contrast should be considered. 3. Persistent heterogeneous masslike intraluminal lesion within the urinary bladder. Finding could represent a urinary bladder or prostatic mass versus blood products. Recommend urologic consultation. 4. Marked Prostatomegaly and  heterogeneity of the prostate gland. 5. Retroperitoneal and likely portacaval lymphadenopathy. 6. Colonic diverticulosis with no acute diverticulitis. 7. Question diffuse colonic bowel thickening with no definite findings of colitis. 8. Trace simple rib fluid ascites. Electronically Signed   By: Iven Finn M.D.   On: 05/12/2022 00:14    Microbiology: No results found for this or any previous visit (from the past 240 hour(s)).   Labs: Basic Metabolic Panel: No results for input(s): "NA", "K", "CL", "CO2", "GLUCOSE", "BUN", "CREATININE", "CALCIUM", "MG", "PHOS" in the last 168 hours. Liver Function Tests: No results for input(s): "AST", "ALT", "ALKPHOS", "BILITOT", "PROT", "ALBUMIN" in the last 168 hours. No results for input(s): "LIPASE", "AMYLASE" in the last 168 hours. No results for input(s): "AMMONIA" in the last 168 hours. CBC: No results for input(s): "WBC", "NEUTROABS", "HGB", "HCT", "MCV", "PLT" in the last 168 hours. Cardiac Enzymes: No results for input(s): "CKTOTAL", "CKMB", "CKMBINDEX", "TROPONINI" in the last 168 hours. D-Dimer No results for input(s): "DDIMER" in the last 72 hours. BNP: Invalid  input(s): "POCBNP" CBG: No results for input(s): "GLUCAP" in the last 168 hours. Anemia work up No results for input(s): "VITAMINB12", "FOLATE", "FERRITIN", "TIBC", "IRON", "RETICCTPCT" in the last 72 hours. Urinalysis    Component Value Date/Time   COLORURINE AMBER (A) 06/19/2022 1645   APPEARANCEUR HAZY (A) 06/07/2022 1645   LABSPEC 1.013 06/07/2022 1645   PHURINE 6.0 06/01/2022 1645   GLUCOSEU NEGATIVE 06/04/2022 1645   HGBUR MODERATE (A) 05/24/2022 1645   BILIRUBINUR MODERATE (A) 05/29/2022 [place] 06/16/2022 1645   PROTEINUR NEGATIVE 06/01/2022 1645   UROBILINOGEN 0.2 08/15/2009 1010   NITRITE NEGATIVE 06/15/2022 1645   LEUKOCYTESUR NEGATIVE 06/17/2022 1645   Sepsis Labs No results for input(s): "WBC" in the last 168 hours.  Invalid input(s):  "PROCALCITONIN", "LACTICIDVEN"     SIGNED:  Shelly Coss, MD  [facility] 06/09/2022, 12:58 PM Pager [phone]  If 7PM-7AM, please contact night-coverage [email or web address] Password [email or web address]

## 2022-06-20 NOTE — Progress Notes (Signed)
Patient became bradycardic, Significant other, Vickie, and daughter, Gomez Cleverly, notified of new changes. Patient passed away at 1842-daughter aware, significant other to be notified on arrival as she is driving from [facility]. Pronounced by Josph Macho RN and Rod Holler RN. Adhikari MD notified.  Post mortem care completed. Elink contacted with request to complete [facility] referral.

## 2022-06-20 DEATH — deceased

## 2022-07-04 ENCOUNTER — Ambulatory Visit (HOSPITAL_COMMUNITY): Admit: 2022-07-04 | Payer: No Typology Code available for payment source | Admitting: Gastroenterology

## 2022-07-04 ENCOUNTER — Encounter (HOSPITAL_COMMUNITY): Payer: Self-pay

## 2022-07-04 SURGERY — ENDOSCOPIC RETROGRADE CHOLANGIOPANCREATOGRAPHY (ERCP) WITH PROPOFOL
Anesthesia: General
